# Patient Record
Sex: Male | Born: 1965 | Race: Black or African American | Hispanic: No | Marital: Married | Smoking: Former smoker
Health system: Southern US, Community
[De-identification: ages and names within clinical notes are randomized; demographics above are authoritative.]

## PROBLEM LIST (undated history)

## (undated) ENCOUNTER — Emergency Department (HOSPITAL_COMMUNITY): Payer: BLUE CROSS/BLUE SHIELD | Source: Home / Self Care

## (undated) DIAGNOSIS — R5383 Other fatigue: Secondary | ICD-10-CM

## (undated) DIAGNOSIS — Z720 Tobacco use: Secondary | ICD-10-CM

## (undated) DIAGNOSIS — N049 Nephrotic syndrome with unspecified morphologic changes: Secondary | ICD-10-CM

## (undated) DIAGNOSIS — I420 Dilated cardiomyopathy: Secondary | ICD-10-CM

## (undated) DIAGNOSIS — E1140 Type 2 diabetes mellitus with diabetic neuropathy, unspecified: Secondary | ICD-10-CM

## (undated) DIAGNOSIS — E11319 Type 2 diabetes mellitus with unspecified diabetic retinopathy without macular edema: Secondary | ICD-10-CM

## (undated) DIAGNOSIS — I10 Essential (primary) hypertension: Secondary | ICD-10-CM

## (undated) DIAGNOSIS — I2510 Atherosclerotic heart disease of native coronary artery without angina pectoris: Secondary | ICD-10-CM

## (undated) DIAGNOSIS — E785 Hyperlipidemia, unspecified: Secondary | ICD-10-CM

## (undated) DIAGNOSIS — D649 Anemia, unspecified: Secondary | ICD-10-CM

## (undated) DIAGNOSIS — I5022 Chronic systolic (congestive) heart failure: Secondary | ICD-10-CM

## (undated) DIAGNOSIS — G629 Polyneuropathy, unspecified: Secondary | ICD-10-CM

## (undated) DIAGNOSIS — F1411 Cocaine abuse, in remission: Secondary | ICD-10-CM

## (undated) DIAGNOSIS — N189 Chronic kidney disease, unspecified: Secondary | ICD-10-CM

## (undated) DIAGNOSIS — M1990 Unspecified osteoarthritis, unspecified site: Secondary | ICD-10-CM

## (undated) DIAGNOSIS — T4145XA Adverse effect of unspecified anesthetic, initial encounter: Secondary | ICD-10-CM

## (undated) DIAGNOSIS — G4733 Obstructive sleep apnea (adult) (pediatric): Secondary | ICD-10-CM

## (undated) DIAGNOSIS — F32A Depression, unspecified: Secondary | ICD-10-CM

## (undated) DIAGNOSIS — F329 Major depressive disorder, single episode, unspecified: Secondary | ICD-10-CM

## (undated) DIAGNOSIS — E669 Obesity, unspecified: Secondary | ICD-10-CM

## (undated) DIAGNOSIS — I119 Hypertensive heart disease without heart failure: Secondary | ICD-10-CM

## (undated) DIAGNOSIS — E039 Hypothyroidism, unspecified: Secondary | ICD-10-CM

## (undated) DIAGNOSIS — N058 Unspecified nephritic syndrome with other morphologic changes: Secondary | ICD-10-CM

## (undated) DIAGNOSIS — H3581 Retinal edema: Secondary | ICD-10-CM

## (undated) DIAGNOSIS — T8859XA Other complications of anesthesia, initial encounter: Secondary | ICD-10-CM

## (undated) DIAGNOSIS — K219 Gastro-esophageal reflux disease without esophagitis: Secondary | ICD-10-CM

## (undated) DIAGNOSIS — F1011 Alcohol abuse, in remission: Secondary | ICD-10-CM

## (undated) DIAGNOSIS — I82509 Chronic embolism and thrombosis of unspecified deep veins of unspecified lower extremity: Secondary | ICD-10-CM

## (undated) HISTORY — DX: Atherosclerotic heart disease of native coronary artery without angina pectoris: I25.10

## (undated) HISTORY — DX: Dilated cardiomyopathy: I42.0

## (undated) HISTORY — DX: Tobacco use: Z72.0

## (undated) HISTORY — DX: Hypothyroidism, unspecified: E03.9

## (undated) HISTORY — DX: Morbid (severe) obesity due to excess calories: E66.01

## (undated) HISTORY — DX: Type 2 diabetes mellitus with unspecified diabetic retinopathy without macular edema: E11.319

## (undated) HISTORY — DX: Other fatigue: R53.83

## (undated) HISTORY — DX: Alcohol abuse, in remission: F10.11

## (undated) HISTORY — DX: Hypertensive heart disease without heart failure: I11.9

## (undated) HISTORY — DX: Unspecified nephritic syndrome with other morphologic changes: N05.8

## (undated) HISTORY — PX: WISDOM TOOTH EXTRACTION: SHX21

## (undated) HISTORY — DX: Cocaine abuse, in remission: F14.11

## (undated) HISTORY — DX: Chronic systolic (congestive) heart failure: I50.22

## (undated) HISTORY — DX: Obesity, unspecified: E66.9

## (undated) HISTORY — DX: Obstructive sleep apnea (adult) (pediatric): G47.33

## (undated) HISTORY — DX: Chronic kidney disease, unspecified: N18.9

## (undated) HISTORY — DX: Type 2 diabetes mellitus with diabetic neuropathy, unspecified: E11.40

## (undated) HISTORY — DX: Gastro-esophageal reflux disease without esophagitis: K21.9

## (undated) HISTORY — DX: Nephrotic syndrome with unspecified morphologic changes: N04.9

## (undated) HISTORY — DX: Hyperlipidemia, unspecified: E78.5

## (undated) HISTORY — DX: Chronic embolism and thrombosis of unspecified deep veins of unspecified lower extremity: I82.509

---

## 2004-04-17 ENCOUNTER — Emergency Department (HOSPITAL_COMMUNITY): Admission: EM | Admit: 2004-04-17 | Discharge: 2004-04-17 | Payer: Self-pay | Admitting: Emergency Medicine

## 2007-07-29 ENCOUNTER — Emergency Department (HOSPITAL_COMMUNITY): Admission: EM | Admit: 2007-07-29 | Discharge: 2007-07-29 | Payer: Self-pay | Admitting: Emergency Medicine

## 2008-04-18 ENCOUNTER — Emergency Department (HOSPITAL_COMMUNITY): Admission: EM | Admit: 2008-04-18 | Discharge: 2008-04-19 | Payer: Self-pay | Admitting: Emergency Medicine

## 2010-08-08 ENCOUNTER — Emergency Department (HOSPITAL_COMMUNITY)
Admission: EM | Admit: 2010-08-08 | Discharge: 2010-08-08 | Disposition: A | Discharge status: Home / Self Care | Payer: Self-pay | Attending: Emergency Medicine | Admitting: Emergency Medicine

## 2010-08-08 DIAGNOSIS — L02818 Cutaneous abscess of other sites: Secondary | ICD-10-CM | POA: Insufficient documentation

## 2010-08-08 DIAGNOSIS — E119 Type 2 diabetes mellitus without complications: Secondary | ICD-10-CM | POA: Insufficient documentation

## 2011-02-01 ENCOUNTER — Other Ambulatory Visit: Payer: Self-pay

## 2011-02-01 DIAGNOSIS — N186 End stage renal disease: Secondary | ICD-10-CM

## 2011-02-01 DIAGNOSIS — T82898A Other specified complication of vascular prosthetic devices, implants and grafts, initial encounter: Secondary | ICD-10-CM

## 2011-02-01 DIAGNOSIS — I10 Essential (primary) hypertension: Secondary | ICD-10-CM

## 2011-02-11 LAB — COMPREHENSIVE METABOLIC PANEL
ALT: 29
AST: 23
Albumin: 3.3 — ABNORMAL LOW
Alkaline Phosphatase: 98
BUN: 12
CO2: 29
Calcium: 9.3
Chloride: 102
Creatinine, Ser: 1.14
GFR calc Af Amer: >60
GFR calc non Af Amer: >60
Glucose, Bld: 242 — ABNORMAL HIGH
Potassium: 4.1
Sodium: 137
Total Bilirubin: 1.1
Total Protein: 7.1

## 2011-02-11 LAB — URINALYSIS, ROUTINE W REFLEX MICROSCOPIC
Bilirubin Urine: NEGATIVE
Glucose, UA: 500 — AB
Hgb urine dipstick: NEGATIVE
Ketones, ur: NEGATIVE
Nitrite: NEGATIVE
Protein, ur: NEGATIVE
Specific Gravity, Urine: 1.036 — ABNORMAL HIGH
Urobilinogen, UA: 1
pH: 6

## 2011-02-20 LAB — GLUCOSE, CAPILLARY: Glucose-Capillary: 132 — ABNORMAL HIGH

## 2012-02-12 ENCOUNTER — Emergency Department (HOSPITAL_COMMUNITY)
Admission: EM | Admit: 2012-02-12 | Discharge: 2012-02-12 | Disposition: A | Discharge status: Home / Self Care | Payer: Self-pay | Attending: Emergency Medicine | Admitting: Emergency Medicine

## 2012-02-12 ENCOUNTER — Encounter (HOSPITAL_COMMUNITY): Payer: Self-pay | Admitting: *Deleted

## 2012-02-12 DIAGNOSIS — E119 Type 2 diabetes mellitus without complications: Secondary | ICD-10-CM | POA: Insufficient documentation

## 2012-02-12 DIAGNOSIS — Z760 Encounter for issue of repeat prescription: Secondary | ICD-10-CM

## 2012-02-12 DIAGNOSIS — L02219 Cutaneous abscess of trunk, unspecified: Secondary | ICD-10-CM | POA: Insufficient documentation

## 2012-02-12 DIAGNOSIS — Z79899 Other long term (current) drug therapy: Secondary | ICD-10-CM | POA: Insufficient documentation

## 2012-02-12 DIAGNOSIS — E7800 Pure hypercholesterolemia, unspecified: Secondary | ICD-10-CM | POA: Insufficient documentation

## 2012-02-12 DIAGNOSIS — L0291 Cutaneous abscess, unspecified: Secondary | ICD-10-CM

## 2012-02-12 DIAGNOSIS — Z794 Long term (current) use of insulin: Secondary | ICD-10-CM | POA: Insufficient documentation

## 2012-02-12 DIAGNOSIS — F17200 Nicotine dependence, unspecified, uncomplicated: Secondary | ICD-10-CM | POA: Insufficient documentation

## 2012-02-12 DIAGNOSIS — I10 Essential (primary) hypertension: Secondary | ICD-10-CM | POA: Insufficient documentation

## 2012-02-12 DIAGNOSIS — L089 Local infection of the skin and subcutaneous tissue, unspecified: Secondary | ICD-10-CM | POA: Insufficient documentation

## 2012-02-12 HISTORY — DX: Essential (primary) hypertension: I10

## 2012-02-12 LAB — GLUCOSE, CAPILLARY: Glucose-Capillary: 285 mg/dL — ABNORMAL HIGH (ref 70–99)

## 2012-02-12 MED ORDER — METFORMIN HCL 1000 MG PO TABS: 1000.0000 mg | ORAL_TABLET | Freq: Two times a day (BID) | ORAL | Status: DC

## 2012-02-12 MED ORDER — SULFAMETHOXAZOLE-TRIMETHOPRIM 800-160 MG PO TABS: 1.0000 | ORAL_TABLET | Freq: Two times a day (BID) | ORAL | Status: DC

## 2012-02-12 MED ORDER — HYDROCODONE-ACETAMINOPHEN 5-325 MG PO TABS: ORAL_TABLET | ORAL | Status: DC

## 2012-02-12 NOTE — ED Notes (Signed)
Pt has abscess to back that started 1 week ago.

## 2012-02-12 NOTE — ED Provider Notes (Signed)
History     CSN: [id number]  Arrival date & time 02/12/12  1447   First MD Initiated Contact with Patient 02/12/12 1531      Chief Complaint  Patient presents with  . Abscess    (Consider location/radiation/quality/duration/timing/severity/associated sxs/prior treatment) HPI Comments: Patient presents with multiple abscesses located on his back and the back of his head. Abscesses are recurrent for the past 4 years. The largest one is located on his mid back. It has been present for approximately one week and has been disrupting his sleep due to pain. It has been draining. Patient denies fever, chills, nausea, vomiting, diarrhea. Nothing has made the pain better or worse. Onset gradual. Course has been constant. No treatments prior to arrival.   Patient is a diabetic. He states that he has been out of his metformin for several days and is requesting a medication refill. No history of kidney trouble. Patient does not currently have PCP but is in the process of finding one.  Patient is a 46 y.o. male presenting with abscess.  Abscess  Pertinent negatives include no fever and no vomiting.    Past Medical History  Diagnosis Date  . Diabetes mellitus   . Hypertension   . High cholesterol     History reviewed. No pertinent past surgical history.  History reviewed. No pertinent family history.  History  Substance Use Topics  . Smoking status: Current Every Day Smoker    Types: Cigarettes  . Smokeless tobacco: Not on file  . Alcohol Use: Yes     rarely      Review of Systems  Constitutional: Negative for fever.  Gastrointestinal: Negative for nausea and vomiting.  Skin: Negative for color change.       Positive for abscess  Hematological: Negative for adenopathy.    Allergies  Review of patient's allergies indicates no known allergies.  Home Medications   Current Outpatient Rx  Name Route Sig Dispense Refill  . INSULIN ASPART 100 UNIT/ML [place] SOLN Subcutaneous Inject  26 Units into the skin 2 (two) times daily.    Marland Kitchen HYDROCODONE-ACETAMINOPHEN 5-325 MG PO TABS  Take 1-2 tablets every 6 hours as needed for severe pain 10 tablet 0  . METFORMIN HCL 1000 MG PO TABS Oral Take 1 tablet (1,000 mg total) by mouth 2 (two) times daily. 60 tablet 0  . SULFAMETHOXAZOLE-TRIMETHOPRIM 800-160 MG PO TABS Oral Take 1 tablet by mouth every 12 (twelve) hours. 14 tablet 0    BP 149/105  Pulse 99  Temp 99.5 F (37.5 C) (Oral)  Resp 22  Ht 5\' 10"  (1.778 m)  Wt 287 lb (130.182 kg)  BMI 41.18 kg/m2  SpO2 99%  Physical Exam  Nursing note and vitals reviewed. Constitutional: He appears well-developed and well-nourished.  HENT:  Head: Normocephalic and atraumatic.  Eyes: Conjunctivae normal are normal.  Neck: Normal range of motion. Neck supple.  Pulmonary/Chest: No respiratory distress.  Neurological: He is alert.  Skin: Skin is warm and dry.       5cm x 5cm area of induration consistent with abscess on right mid-back, no surrounding cellulitis, no drainage noted. Several scattered crusted pustules posterior scalp without evidence of cellulitis. No drainage.   Psychiatric: He has a normal mood and affect.    ED Course  Procedures (including critical care time)  Labs Reviewed  GLUCOSE, CAPILLARY - Abnormal; Notable for the following:    Glucose-Capillary 285 (*)     All other components within normal limits  No results found.   1. Abscess   2. Medication refill    4:58 PM Patient seen and examined.   Vital signs reviewed and are as follows: Filed Vitals:   02/12/12 1457  BP: 149/105  Pulse: 99  Temp: 99.5 F (37.5 C)  Resp: 22   INCISION AND DRAINAGE Performed by: Carolee Rota Consent: Verbal consent obtained. Risks and benefits: risks, benefits and alternatives were discussed Type: abscess  Body area: right middle back  Anesthesia: local infiltration  Local anesthetic: lidocaine 2% with epinephrine  Anesthetic total: 3 ml  Complexity:  complex Blunt dissection to break up loculations  Drainage: purulent  Drainage amount: moderate  Packing material: 1/4 in iodoform gauze  Patient tolerance: Patient tolerated the procedure well with no immediate complications.  4:59 PM The patient was urged to return to the Emergency Department urgently with worsening pain, swelling, expanding erythema especially if it streaks away from the affected area, fever, or if they have any other concerns.   The patient was instructed to remove packing at home in 48 hours and return to the Emergency Department or go to their PCP at that time for a recheck if their symptoms are not entirely resolved.  The patient verbalized understanding and stated agreement with this plan.   4:59 PM Patient counseled on use of narcotic pain medications. Counseled not to combine these medications with others containing tylenol. Urged not to drink alcohol, drive, or perform any other activities that requires focus while taking these medications. The patient verbalizes understanding and agrees with the plan.  Urged PCP follow-up, referrals given.    MDM  Patient with skin abscess amenable to incision and drainage on back.  No areas to drain on scalp, doubt kerion. Abscess was large enough to warrant packing with removal and wound recheck in 2 days if not improving. Patient seems reliable. No signs of cellulitis is surrounding skin.  Will d/c to home.  Abx given DM and pustules on scalp.  Med refill: metformin refilled, no urine change, no h/o renal problems.        Renne Crigler, [place] 02/12/12 [phone]

## 2012-02-12 NOTE — ED Provider Notes (Signed)
Medical screening examination/treatment/procedure(s) were performed by non-physician practitioner and as supervising physician I was immediately available for consultation/collaboration.  Larry Alcock, MD 02/12/12 1725 

## 2012-03-24 ENCOUNTER — Emergency Department (HOSPITAL_COMMUNITY)
Admission: EM | Admit: 2012-03-24 | Discharge: 2012-03-24 | Disposition: A | Discharge status: Home / Self Care | Payer: Self-pay | Attending: Emergency Medicine | Admitting: Emergency Medicine

## 2012-03-24 ENCOUNTER — Encounter (HOSPITAL_COMMUNITY): Payer: Self-pay

## 2012-03-24 ENCOUNTER — Emergency Department (HOSPITAL_COMMUNITY): Payer: Self-pay

## 2012-03-24 DIAGNOSIS — I10 Essential (primary) hypertension: Secondary | ICD-10-CM | POA: Insufficient documentation

## 2012-03-24 DIAGNOSIS — E7800 Pure hypercholesterolemia, unspecified: Secondary | ICD-10-CM | POA: Insufficient documentation

## 2012-03-24 DIAGNOSIS — Z794 Long term (current) use of insulin: Secondary | ICD-10-CM | POA: Insufficient documentation

## 2012-03-24 DIAGNOSIS — J069 Acute upper respiratory infection, unspecified: Secondary | ICD-10-CM | POA: Insufficient documentation

## 2012-03-24 DIAGNOSIS — F17200 Nicotine dependence, unspecified, uncomplicated: Secondary | ICD-10-CM | POA: Insufficient documentation

## 2012-03-24 DIAGNOSIS — E119 Type 2 diabetes mellitus without complications: Secondary | ICD-10-CM | POA: Insufficient documentation

## 2012-03-24 LAB — GLUCOSE, CAPILLARY: Glucose-Capillary: 226 mg/dL — ABNORMAL HIGH (ref 70–99)

## 2012-03-24 MED ORDER — GUAIFENESIN-CODEINE 100-10 MG/5ML PO SOLN
5.0000 mL | Freq: Once | ORAL | Status: AC
  Administered 2012-03-24: 5 mL via ORAL
  Filled 2012-03-24: qty 5

## 2012-03-24 MED ORDER — GUAIFENESIN-CODEINE 100-10 MG/5ML PO SYRP: 5.0000 mL | ORAL_SOLUTION | Freq: Three times a day (TID) | ORAL | Status: DC | PRN

## 2012-03-24 MED ORDER — DEXAMETHASONE 6 MG PO TABS
10.0000 mg | ORAL_TABLET | Freq: Once | ORAL | Status: AC
  Administered 2012-03-24: 10 mg via ORAL
  Filled 2012-03-24: qty 1

## 2012-03-24 NOTE — ED Notes (Signed)
cbg 226 

## 2012-03-24 NOTE — ED Notes (Signed)
Pt c/o of chest pain greater with coughing.  Pt also c/o of shortness of breath x 1 week greater with walking

## 2012-03-24 NOTE — ED Notes (Signed)
Pt presents with no acute distress.  C/o of cold like symptoms x 1 week.  denies  Cardiac chest pain

## 2012-03-27 ENCOUNTER — Emergency Department (HOSPITAL_COMMUNITY)
Admission: EM | Admit: 2012-03-27 | Discharge: 2012-03-27 | Disposition: A | Discharge status: Home / Self Care | Payer: Self-pay | Attending: Emergency Medicine | Admitting: Emergency Medicine

## 2012-03-27 ENCOUNTER — Encounter (HOSPITAL_COMMUNITY): Payer: Self-pay | Admitting: *Deleted

## 2012-03-27 DIAGNOSIS — E7800 Pure hypercholesterolemia, unspecified: Secondary | ICD-10-CM | POA: Insufficient documentation

## 2012-03-27 DIAGNOSIS — F17200 Nicotine dependence, unspecified, uncomplicated: Secondary | ICD-10-CM | POA: Insufficient documentation

## 2012-03-27 DIAGNOSIS — J029 Acute pharyngitis, unspecified: Secondary | ICD-10-CM | POA: Insufficient documentation

## 2012-03-27 DIAGNOSIS — I10 Essential (primary) hypertension: Secondary | ICD-10-CM | POA: Insufficient documentation

## 2012-03-27 DIAGNOSIS — R05 Cough: Secondary | ICD-10-CM | POA: Insufficient documentation

## 2012-03-27 DIAGNOSIS — Z79899 Other long term (current) drug therapy: Secondary | ICD-10-CM | POA: Insufficient documentation

## 2012-03-27 DIAGNOSIS — R059 Cough, unspecified: Secondary | ICD-10-CM | POA: Insufficient documentation

## 2012-03-27 DIAGNOSIS — Z794 Long term (current) use of insulin: Secondary | ICD-10-CM | POA: Insufficient documentation

## 2012-03-27 DIAGNOSIS — E119 Type 2 diabetes mellitus without complications: Secondary | ICD-10-CM | POA: Insufficient documentation

## 2012-03-27 LAB — RAPID STREP SCREEN (MED CTR MEBANE ONLY): Streptococcus, Group A Screen (Direct): NEGATIVE

## 2012-03-27 LAB — GLUCOSE, CAPILLARY: Glucose-Capillary: 251 mg/dL — ABNORMAL HIGH (ref 70–99)

## 2012-03-27 MED ORDER — DEXAMETHASONE SODIUM PHOSPHATE 10 MG/ML IJ SOLN
10.0000 mg | Freq: Once | INTRAMUSCULAR | Status: AC
  Administered 2012-03-27: 10 mg via INTRAMUSCULAR
  Filled 2012-03-27: qty 1

## 2012-03-27 MED ORDER — DIPHENHYDRAMINE HCL 25 MG PO TABS: 25.0000 mg | ORAL_TABLET | Freq: Four times a day (QID) | ORAL | Status: DC

## 2012-03-27 MED ORDER — HYDROCHLOROTHIAZIDE 25 MG PO TABS: 25.0000 mg | ORAL_TABLET | Freq: Every day | ORAL | Status: DC

## 2012-03-27 MED ORDER — DIPHENHYDRAMINE HCL 25 MG PO CAPS
25.0000 mg | ORAL_CAPSULE | Freq: Once | ORAL | Status: AC
  Administered 2012-03-27: 25 mg via ORAL
  Filled 2012-03-27: qty 1

## 2012-03-27 MED ORDER — METFORMIN HCL 1000 MG PO TABS: 1000.0000 mg | ORAL_TABLET | Freq: Two times a day (BID) | ORAL | Status: DC

## 2012-03-27 MED ORDER — GI COCKTAIL ~~LOC~~
30.0000 mL | Freq: Once | ORAL | Status: AC
  Administered 2012-03-27: 30 mL via ORAL
  Filled 2012-03-27: qty 30

## 2012-03-27 MED ORDER — INSULIN ASPART 100 UNIT/ML ~~LOC~~ SOLN: 23.0000 [IU] | Freq: Two times a day (BID) | SUBCUTANEOUS | Status: DC

## 2012-03-27 NOTE — ED Provider Notes (Signed)
History     CSN: [id number]  Arrival date & time 03/27/12  0106   First MD Initiated Contact with Patient 03/27/12 0224      Chief Complaint  Patient presents with  . Oral Swelling    throat   HPI  History provided by the patient. Patient is a 46 year old male with history of hypertension, hyperlipidemia, diabetes who presents with complaints of sore throat and swelling. Patient reports he has had symptoms of upper respiratory infection with cough, congestion and rhinorrhea for the past several days. Symptoms have been waxing and waning and earlier yesterday he developed gradually increasing sharp sore throat pain worse with coughing and swallowing. Patient also feels sensation of throat swelling. He denies any difficulty breathing or swallowing. Patient is handling salivary secretions.    Past Medical History  Diagnosis Date  . Diabetes mellitus   . Hypertension   . High cholesterol     History reviewed. No pertinent past surgical history.  History reviewed. No pertinent family history.  History  Substance Use Topics  . Smoking status: Current Every Day Smoker    Types: Cigarettes  . Smokeless tobacco: Not on file  . Alcohol Use: Yes     Comment: rarely      Review of Systems  Constitutional: Negative for fever and chills.  HENT: Positive for congestion and sore throat. Negative for drooling and mouth sores.   Respiratory: Positive for cough. Negative for wheezing and stridor.   Gastrointestinal: Negative for nausea, vomiting and diarrhea.  Skin: Negative for rash.    Allergies  Review of patient's allergies indicates no known allergies.  Home Medications   Current Outpatient Rx  Name  Route  Sig  Dispense  Refill  . GUAIFENESIN-CODEINE 100-10 MG/5ML PO SYRP   Oral   Take 5 mLs by mouth 3 (three) times daily as needed for cough.   120 mL   0   . METFORMIN HCL 1000 MG PO TABS   Oral   Take 1 tablet (1,000 mg total) by mouth 2 (two) times daily.   60  tablet   0   . INSULIN ASPART 100 UNIT/ML [place] SOLN   Subcutaneous   Inject 23 Units into the skin 2 (two) times daily.          Marland Kitchen PSEUDOEPHEDRINE-GUAIFENESIN ER 60-600 MG PO TB12   Oral   Take 1-2 tablets by mouth every 12 (twelve) hours as needed. For sinus and chest congestion           BP 159/88  Pulse 96  Temp 99.6 F (37.6 C) (Oral)  Resp 17  SpO2 98%  Physical Exam  Nursing note and vitals reviewed. Constitutional: He appears well-developed and well-nourished. No distress.  HENT:  Head: Normocephalic.  Right Ear: External ear normal.  Left Ear: External ear normal.  Mouth/Throat: Oropharynx is clear and moist.       Mild erythema of pharynx. Uvula midline. Normal tonsils without exudate. Tongue and lips normal without swelling.  Neck: Normal range of motion. Neck supple. No tracheal deviation present.  Cardiovascular: Normal rate and regular rhythm.   Pulmonary/Chest: Effort normal and breath sounds normal. No stridor. No respiratory distress. He has no rales.  Abdominal: Soft. There is no tenderness. There is no rebound and no guarding.  Lymphadenopathy:    He has no cervical adenopathy.  Neurological: He is alert.  Skin: Skin is warm. No rash noted.  Psychiatric: He has a normal mood and affect. His behavior is  normal.    ED Course  Procedures   Results for orders placed during the hospital encounter of 03/27/12  RAPID STREP SCREEN      Component Value Range   Streptococcus, Group A Screen (Direct) NEGATIVE  NEGATIVE  GLUCOSE, CAPILLARY      Component Value Range   Glucose-Capillary 251 (*) 70 - 99 mg/dL   Comment 1 Notify RN     Comment 2 Documented in Chart         1. Pharyngitis       MDM  Patient seen and evaluated. Patient in no acute distress with normal respirations and O2 sat. No stridor. No significant findings of swelling or edema.      Angus Seller, [place] 03/28/12 [phone]

## 2012-03-27 NOTE — ED Notes (Signed)
Pt states that he was on Tuesday for chest congestion and some throat swelling. He was given steroids, which helped. Now the swelling has progressed and he is felling like his throat is more constricted. Pt is talking normally and joking with his wife. VS WNL.

## 2012-03-27 NOTE — ED Notes (Signed)
Patient is alert and oriented x3.  He was seen here on Tuesday for upper respiratory  Infection and sent home without antibiotics.  He has returned with throat swelling and Difficulty swallowing.  He rates his current pain level at 10 of 10.

## 2012-03-28 NOTE — ED Provider Notes (Signed)
Medical screening examination/treatment/procedure(s) were performed by non-physician practitioner and as supervising physician I was immediately available for consultation/collaboration.  Sunnie Nielsen, MD 03/28/12 [phone]

## 2012-04-02 NOTE — ED Provider Notes (Signed)
History    46 year old male with URI type symptoms. Patient reports feeling congested and having a cough. Gradual onset of symptoms about 3 days ago. Cough is nonproductive. No fevers or chills. Does complain of a sore throat. No difficulty breathing or swallowing. No rash. No leg pain or swelling. Denies history of blood clot. Denies chest pain.  CSN: [id number]  Arrival date & time 03/24/12  1149   First MD Initiated Contact with Patient 03/24/12 1228      Chief Complaint  Patient presents with  . URI    x1week  . Sore Throat    x1week  . Fever    (Consider location/radiation/quality/duration/timing/severity/associated sxs/prior treatment) HPI  Past Medical History  Diagnosis Date  . Diabetes mellitus   . Hypertension   . High cholesterol     History reviewed. No pertinent past surgical history.  No family history on file.  History  Substance Use Topics  . Smoking status: Current Every Day Smoker    Types: Cigarettes  . Smokeless tobacco: Not on file  . Alcohol Use: Yes     Comment: rarely      Review of Systems  Review of symptoms negative unless otherwise noted in HPI.  Allergies  Review of patient's allergies indicates no known allergies.  Home Medications   Current Outpatient Rx  Name  Route  Sig  Dispense  Refill  . METFORMIN HCL 1000 MG PO TABS   Oral   Take 1 tablet (1,000 mg total) by mouth 2 (two) times daily.   60 tablet   0   . PSEUDOEPHEDRINE-GUAIFENESIN ER 60-600 MG PO TB12   Oral   Take 1-2 tablets by mouth every 12 (twelve) hours as needed. For sinus and chest congestion         . DIPHENHYDRAMINE HCL 25 MG PO TABS   Oral   Take 1 tablet (25 mg total) by mouth every 6 (six) hours.   20 tablet   0   . GUAIFENESIN-CODEINE 100-10 MG/5ML PO SYRP   Oral   Take 5 mLs by mouth 3 (three) times daily as needed for cough.   120 mL   0   . HYDROCHLOROTHIAZIDE 25 MG PO TABS   Oral   Take 1 tablet (25 mg total) by mouth daily.   30  tablet   0   . INSULIN ASPART 100 UNIT/ML [place] SOLN   Subcutaneous   Inject 23 Units into the skin 2 (two) times daily.          . INSULIN ASPART 100 UNIT/ML [place] SOLN   Subcutaneous   Inject 23 Units into the skin 2 (two) times daily at 10 AM and 5 PM.   1 vial   12   . METFORMIN HCL 1000 MG PO TABS   Oral   Take 1 tablet (1,000 mg total) by mouth 2 (two) times daily.   30 tablet   0     BP 150/86  Pulse 102  Temp 98.9 F (37.2 C) (Oral)  Resp 20  Ht 5\' 11"  (1.803 m)  Wt 287 lb (130.182 kg)  BMI 40.03 kg/m2  SpO2 100%  Physical Exam  Nursing note and vitals reviewed. Constitutional: He appears well-developed and well-nourished. No distress.  HENT:  Head: Normocephalic and atraumatic.       Posterior pharynx is clear. Uvula is midline. Handling secretions. Normal phonation. Submental tissues are soft. Neck is supple. No stridor. No bruit. No adenopathy.  Eyes: Conjunctivae normal are  normal. Right eye exhibits no discharge. Left eye exhibits no discharge.  Neck: Neck supple.  Cardiovascular: Normal rate, regular rhythm and normal heart sounds.  Exam reveals no gallop and no friction rub.   No murmur heard. Pulmonary/Chest: Effort normal and breath sounds normal. No respiratory distress. He has no wheezes.  Abdominal: Soft. He exhibits no distension. There is no tenderness.  Musculoskeletal: He exhibits no edema and no tenderness.  Neurological: He is alert.  Skin: Skin is warm and dry.  Psychiatric: He has a normal mood and affect. His behavior is normal. Thought content normal.    ED Course  Procedures (including critical care time)  Labs Reviewed  GLUCOSE, CAPILLARY - Abnormal; Notable for the following:    Glucose-Capillary 226 (*)     All other components within normal limits  LAB REPORT - SCANNED   No results found.   1. Viral URI       MDM  Rolled male with URI type symptoms. Suspect viral illness. Patient is complaining of sore throat. Very low  suspicion for the space neck infection. Dose of steroids given for some symptomatic relief. Guaifenesin with codeine for his cough. Chest x-ray is clear. No respiratory distress on exam. He is afebrile and oxygen saturations are good. He for discharge at this time. Return precautions were discussed. Outpatient followup otherwise.      Raeford Razor, MD 04/02/12 1022

## 2013-08-30 ENCOUNTER — Other Ambulatory Visit: Payer: Self-pay | Admitting: Internal Medicine

## 2013-08-30 ENCOUNTER — Ambulatory Visit
Admission: RE | Admit: 2013-08-30 | Discharge: 2013-08-30 | Disposition: A | Discharge status: Home / Self Care | Payer: BC Managed Care – PPO | Source: Ambulatory Visit | Attending: Internal Medicine | Admitting: Internal Medicine

## 2013-08-30 DIAGNOSIS — M549 Dorsalgia, unspecified: Secondary | ICD-10-CM

## 2014-08-03 ENCOUNTER — Emergency Department (HOSPITAL_COMMUNITY): Payer: BLUE CROSS/BLUE SHIELD

## 2014-08-03 ENCOUNTER — Encounter (HOSPITAL_COMMUNITY): Payer: Self-pay | Admitting: Emergency Medicine

## 2014-08-03 ENCOUNTER — Emergency Department (HOSPITAL_COMMUNITY)
Admission: EM | Admit: 2014-08-03 | Discharge: 2014-08-04 | Disposition: A | Discharge status: Home / Self Care | Payer: BLUE CROSS/BLUE SHIELD | Attending: Emergency Medicine | Admitting: Emergency Medicine

## 2014-08-03 DIAGNOSIS — Z720 Tobacco use: Secondary | ICD-10-CM | POA: Insufficient documentation

## 2014-08-03 DIAGNOSIS — J039 Acute tonsillitis, unspecified: Secondary | ICD-10-CM | POA: Diagnosis not present

## 2014-08-03 DIAGNOSIS — Z794 Long term (current) use of insulin: Secondary | ICD-10-CM | POA: Insufficient documentation

## 2014-08-03 DIAGNOSIS — Z79899 Other long term (current) drug therapy: Secondary | ICD-10-CM | POA: Diagnosis not present

## 2014-08-03 DIAGNOSIS — J029 Acute pharyngitis, unspecified: Secondary | ICD-10-CM | POA: Insufficient documentation

## 2014-08-03 DIAGNOSIS — E1165 Type 2 diabetes mellitus with hyperglycemia: Secondary | ICD-10-CM

## 2014-08-03 DIAGNOSIS — I10 Essential (primary) hypertension: Secondary | ICD-10-CM | POA: Insufficient documentation

## 2014-08-03 LAB — I-STAT CG4 LACTIC ACID, ED: LACTIC ACID, VENOUS: 1.63 mmol/L (ref 0.5–2.0)

## 2014-08-03 LAB — CBC WITH DIFFERENTIAL/PLATELET
BASOS ABS: 0 10*3/uL (ref 0.0–0.1)
BASOS PCT: 0 % (ref 0–1)
EOS ABS: 0.1 10*3/uL (ref 0.0–0.7)
Eosinophils Relative: 1 % (ref 0–5)
HCT: 47.5 % (ref 39.0–52.0)
HEMOGLOBIN: 16.2 g/dL (ref 13.0–17.0)
Lymphocytes Relative: 26 % (ref 12–46)
Lymphs Abs: 2.3 10*3/uL (ref 0.7–4.0)
MCH: 26.6 pg (ref 26.0–34.0)
MCHC: 34.1 g/dL (ref 30.0–36.0)
MCV: 78.1 fL (ref 78.0–100.0)
MONO ABS: 0.9 10*3/uL (ref 0.1–1.0)
Monocytes Relative: 10 % (ref 3–12)
NEUTROS PCT: 63 % (ref 43–77)
Neutro Abs: 5.7 10*3/uL (ref 1.7–7.7)
Platelets: 210 10*3/uL (ref 150–400)
RBC: 6.08 MIL/uL — AB (ref 4.22–5.81)
RDW: 13.3 % (ref 11.5–15.5)
WBC: 9 10*3/uL (ref 4.0–10.5)

## 2014-08-03 LAB — RAPID STREP SCREEN (MED CTR MEBANE ONLY): STREPTOCOCCUS, GROUP A SCREEN (DIRECT): NEGATIVE

## 2014-08-03 LAB — I-STAT CHEM 8, ED
BUN: 12 mg/dL (ref 6–23)
CALCIUM ION: 1.1 mmol/L — AB (ref 1.12–1.23)
CHLORIDE: 99 mmol/L (ref 96–112)
Creatinine, Ser: 1.2 mg/dL (ref 0.50–1.35)
GLUCOSE: 579 mg/dL — AB (ref 70–99)
HEMATOCRIT: 54 % — AB (ref 39.0–52.0)
Hemoglobin: 18.4 g/dL — ABNORMAL HIGH (ref 13.0–17.0)
POTASSIUM: 4.4 mmol/L (ref 3.5–5.1)
SODIUM: 130 mmol/L — AB (ref 135–145)
TCO2: 19 mmol/L (ref 0–100)

## 2014-08-03 LAB — CBG MONITORING, ED
GLUCOSE-CAPILLARY: 453 mg/dL — AB (ref 70–99)
GLUCOSE-CAPILLARY: 346 mg/dL — AB (ref 70–99)
Glucose-Capillary: 370 mg/dL — ABNORMAL HIGH (ref 70–99)

## 2014-08-03 MED ORDER — SODIUM CHLORIDE 0.9 % IV BOLUS (SEPSIS)
1000.0000 mL | Freq: Once | INTRAVENOUS | Status: AC
  Administered 2014-08-03: 1000 mL via INTRAVENOUS

## 2014-08-03 MED ORDER — AMOXICILLIN 500 MG PO CAPS
1000.0000 mg | ORAL_CAPSULE | Freq: Once | ORAL | Status: AC
Start: 2014-08-03 — End: 2014-08-03
  Administered 2014-08-03: 1000 mg via ORAL
  Filled 2014-08-03: qty 2

## 2014-08-03 MED ORDER — DEXTROSE-NACL 5-0.45 % IV SOLN: INTRAVENOUS | Status: DC

## 2014-08-03 MED ORDER — SODIUM CHLORIDE 0.9 % IV SOLN
1000.0000 mL | INTRAVENOUS | Status: DC
  Administered 2014-08-03: 1000 mL via INTRAVENOUS

## 2014-08-03 MED ORDER — IOHEXOL 300 MG/ML  SOLN
80.0000 mL | Freq: Once | INTRAMUSCULAR | Status: AC | PRN
  Administered 2014-08-03: 100 mL via INTRAVENOUS

## 2014-08-03 MED ORDER — SODIUM CHLORIDE 0.9 % IV SOLN
INTRAVENOUS | Status: DC
  Administered 2014-08-03: 3.9 [IU]/h via INTRAVENOUS
  Filled 2014-08-03: qty 2.5

## 2014-08-03 MED ORDER — HYDROCODONE-ACETAMINOPHEN 5-325 MG PO TABS
2.0000 | ORAL_TABLET | Freq: Once | ORAL | Status: AC
  Administered 2014-08-03: 2 via ORAL
  Filled 2014-08-03: qty 2

## 2014-08-03 MED ORDER — MORPHINE SULFATE 4 MG/ML IJ SOLN
6.0000 mg | Freq: Once | INTRAMUSCULAR | Status: AC
  Administered 2014-08-03: 6 mg via INTRAVENOUS
  Filled 2014-08-03: qty 2

## 2014-08-03 MED ORDER — SODIUM CHLORIDE 0.9 % IV SOLN
1000.0000 mL | Freq: Once | INTRAVENOUS | Status: AC
  Administered 2014-08-03: 1000 mL via INTRAVENOUS

## 2014-08-03 NOTE — ED Notes (Signed)
Pt c/o sore throat and L side ear ache. Pt sts that the pain began on the R side of his throat and ear x 3 weeks ago and now has switched to the left. Pt sts he has a hx of "getting strep throat easily." Pt A&Ox4. Pt spitting into bag because it hurts to swallow.

## 2014-08-03 NOTE — Progress Notes (Signed)
EDCM spoke to patient at bedside.  Patient confirms his pcp is Dr. Eula Listen at [facility] [place].  System updated.

## 2014-08-03 NOTE — ED Provider Notes (Signed)
This patient's care was assumed from Fayrene Helper, PA-C at shift change. Please see his note for further.  This is a 49 y.o. male with a history of diabetes on metformin who presents to emergency complaining of a sore throat for 3 weeks. He reports having difficulty swallowing. The patient does have an elevated blood sugar of 579 with an anion gap of 12. Will start gluco stabilizer while waiting on CT scan.  At my evaluation the patient reports feeling better and wants something to eat and drink. I advised to hold off until CT scan is read. He does have some mild tonsillar hypertrophy on my exam. Uvula is midline without edema. No exudates. Rapid strep is negative.  CT indicates advanced tonsillitis with a 13 mm fluid collection in the deep left tonsil that could reflect abscess or pus accumulation within a crypt. Also indicated reactive cervical adenopathy without suppurative change. Will consult ENT.  Spoke with Dr. Lazarus Salines from ENT who would like the patient placed on Amoxicillin 1000 mg 3 times a day for 3 days then 500 mg 3 times a day for another 7 days. He reports that the patient has not improved by the weekend to return to the emergency department or follow-up with him in his office right away. I advised the patient of this plan. The patient tolerated to Vicodin tablets and his first dose of amoxicillin in the emergency department without difficulty. Glucose still high. Will wait for his blood sugar to fall to return to a normal level before discharge.  Glucose feel to 233. His heart rate is 98 during my reevaluation. He has no elevated white count on CBC.  We'll discharge with amoxicillin, Norco and Naprosyn for pain control. I advised him of the strict return precautions again and he agrees. Advised about watching blood sugars and eating healthy for his diabetes. He reports feeling better and ready for discharge. He is tolerated his oral medications as well as diet soda and water in the emergency  department prior to discharge. I advised the patient to follow-up with their primary care provider this week. I advised the patient to return to the emergency department with new or worsening symptoms or new concerns. The patient verbalized understanding and agreement with plan.   This patient was discussed with and evaluated by Dr. Ethelda Chick who agrees with assessment and plan.   Results for orders placed or performed during the hospital encounter of 08/03/14  Rapid strep screen   (If patient has fever and/or without cough or runny nose)  Result Value Ref Range   Streptococcus, Group A Screen (Direct) NEGATIVE NEGATIVE  CBC with Differential/Platelet  Result Value Ref Range   WBC 9.0 4.0 - 10.5 K/uL   RBC 6.08 (H) 4.22 - 5.81 MIL/uL   Hemoglobin 16.2 13.0 - 17.0 g/dL   HCT 69.6 29.5 - 28.4 %   MCV 78.1 78.0 - 100.0 fL   MCH 26.6 26.0 - 34.0 pg   MCHC 34.1 30.0 - 36.0 g/dL   RDW 13.2 44.0 - 10.2 %   Platelets 210 150 - 400 K/uL   Neutrophils Relative % 63 43 - 77 %   Lymphocytes Relative 26 12 - 46 %   Monocytes Relative 10 3 - 12 %   Eosinophils Relative 1 0 - 5 %   Basophils Relative 0 0 - 1 %   Neutro Abs 5.7 1.7 - 7.7 K/uL   Lymphs Abs 2.3 0.7 - 4.0 K/uL   Monocytes Absolute 0.9 0.1 -  1.0 K/uL   Eosinophils Absolute 0.1 0.0 - 0.7 K/uL   Basophils Absolute 0.0 0.0 - 0.1 K/uL   Smear Review MORPHOLOGY UNREMARKABLE   I-stat chem 8, ed  Result Value Ref Range   Sodium 130 (L) 135 - 145 mmol/L   Potassium 4.4 3.5 - 5.1 mmol/L   Chloride 99 96 - 112 mmol/L   BUN 12 6 - 23 mg/dL   Creatinine, Ser 2.68 0.50 - 1.35 mg/dL   Glucose, Bld 341 (HH) 70 - 99 mg/dL   Calcium, Ion 9.62 (L) 1.12 - 1.23 mmol/L   TCO2 19 0 - 100 mmol/L   Hemoglobin 18.4 (H) 13.0 - 17.0 g/dL   HCT 22.9 (H) 79.8 - 92.1 %   Comment NOTIFIED PHYSICIAN   I-Stat CG4 Lactic Acid, ED  Result Value Ref Range   Lactic Acid, Venous 1.63 0.5 - 2.0 mmol/L  CBG monitoring, ED  Result Value Ref Range    Glucose-Capillary 453 (H) 70 - 99 mg/dL  CBG monitoring, ED  Result Value Ref Range   Glucose-Capillary 370 (H) 70 - 99 mg/dL  CBG monitoring, ED  Result Value Ref Range   Glucose-Capillary 346 (H) 70 - 99 mg/dL  CBG monitoring, ED  Result Value Ref Range   Glucose-Capillary 233 (H) 70 - 99 mg/dL   Ct Soft Tissue Neck W Contrast  08/03/2014   CLINICAL DATA:  Sore throat with swallowing for 3 weeks.  EXAM: CT NECK WITH CONTRAST  TECHNIQUE: Multidetector CT imaging of the neck was performed using the standard protocol following the bolus administration of intravenous contrast.  CONTRAST:  OMNIPAQUE IOHEXOL 300 MG/ML  SOLN  COMPARISON:  None.  FINDINGS: Pharynx and larynx: Palatine tonsil enlargement, asymmetric to the left where there is also fat haziness around the visceral compartment. This appearance, with bilateral cervical lymphadenopathy, is consistent with tonsillitis. Within the deep portion of the left tonsil is a 13 mm oval fluid collection which does not appear completely encapsulated. There is mild submucosal edema extending along the left oral pharyngeal wall towards the supraglottic larynx. Mild medialization of the left vocal fold, likely from described mucosal thickening. No retropharyngeal edema.  Salivary glands: Negative  Thyroid: Negative  Lymph nodes: Rounded enlargement of bilateral cervical chain lymph nodes. No suppurative change.  Vascular: Major cervical vessels are patent.  Limited intracranial: Negative  Visualized orbits: Remote blowout fracture of the medial wall left orbit with medialization of the medial rectus.  Mastoids and visualized paranasal sinuses: Clear  Skeleton: Negative  Upper chest: Clear  IMPRESSION: 1. Advanced tonsillitis. A 13 mm fluid collection in the deep left tonsil could reflect abscess or pus accumulation within a crypt. 2. Reactive cervical adenopathy without suppurative change. 3. If no clinical resolution after antibiotics, recommend repeat  imaging.   Electronically Signed   By: Marnee Spring M.D.   On: 08/03/2014 20:37       Everlene Farrier, PA-C 08/04/14 1941  [name], MD 08/14/14 [phone]

## 2014-08-03 NOTE — ED Provider Notes (Signed)
CSN: [id number]     Arrival date & time 08/03/14  1638 History  This chart was scribe for Charles Sou, MD by Charles Montgomery, ED Scribe. The patient was seen in room WTR8/WTR8 and the patient's care was started at 7:04 PM.    Chief Complaint  Patient presents with  . Sore Throat   The history is provided by the patient. No language interpreter was used.   HPI Comments: Charles Montgomery is a 49 y.o. male with a hx of DM, HLD, and HTN who presents to the Emergency Department complaining of sore throat onset 3 weeks ago. He explains that the sore throat started on his right side and has now moved to his left side.He reports associated trouble swallowing as he has to spit into a bag, tactile fever, chills, and ear pain. He denies abdominal pain, rhinorrhea, cough, CP, and SOB. He states that he took Tylenol Sore Throat and gargled Peroxide with no relief.  He reports a similar symptoms 12 years ago where he was hospitalized with IV antibiotic but did not have his tonsils removed.   Past Medical History  Diagnosis Date  . Diabetes mellitus   . Hypertension   . High cholesterol    History reviewed. No pertinent past surgical history. No family history on file. History  Substance Use Topics  . Smoking status: Current Every Day Smoker    Types: Cigarettes  . Smokeless tobacco: Not on file  . Alcohol Use: Yes     Comment: rarely    Review of Systems  Constitutional: Positive for fever.  HENT: Positive for ear pain, sore throat and trouble swallowing.   Respiratory: Negative for cough and shortness of breath.   Cardiovascular: Negative for chest pain.  Gastrointestinal: Negative for abdominal pain.  All other systems reviewed and are negative.     Allergies  Review of patient's allergies indicates no known allergies.  Home Medications   Prior to Admission medications   Medication Sig Start Date End Date Taking? Authorizing Provider  diphenhydrAMINE (BENADRYL) 25 MG tablet  Take 1 tablet (25 mg total) by mouth every 6 (six) hours. 03/27/12   Peter Dammen, PA-C  guaiFENesin-codeine (ROBITUSSIN AC) 100-10 MG/5ML syrup Take 5 mLs by mouth 3 (three) times daily as needed for cough. 03/24/12   Raeford Razor, MD  hydrochlorothiazide (HYDRODIURIL) 25 MG tablet Take 1 tablet (25 mg total) by mouth daily. 03/27/12   Peter Dammen, PA-C  insulin aspart (NOVOLOG) 100 UNIT/ML injection Inject 23 Units into the skin 2 (two) times daily.     Historical Provider, MD  insulin aspart (NOVOLOG) 100 UNIT/ML injection Inject 23 Units into the skin 2 (two) times daily at 10 AM and 5 PM. 03/27/12   Ivonne Andrew, PA-C  metFORMIN (GLUCOPHAGE) 1000 MG tablet Take 1 tablet (1,000 mg total) by mouth 2 (two) times daily. 02/12/12   Renne Crigler, PA-C  metFORMIN (GLUCOPHAGE) 1000 MG tablet Take 1 tablet (1,000 mg total) by mouth 2 (two) times daily. 03/27/12   Ivonne Andrew, PA-C  pseudoephedrine-guaifenesin (MUCINEX D) 60-600 MG per tablet Take 1-2 tablets by mouth every 12 (twelve) hours as needed. For sinus and chest congestion    Historical Provider, MD   BP 158/122 mmHg  Pulse 123  Temp(Src) 99.4 F (37.4 C) (Oral)  Resp 22  SpO2 96% Physical Exam  Constitutional: He is oriented to person, place, and time. He appears well-developed and well-nourished. No distress.  HENT:  Head: Normocephalic and atraumatic.  Mouth/Throat: No oropharyngeal  exudate.  Uvula midline and oropharynx erythema No significant tonsillar swelling  Eyes: Conjunctivae and EOM are normal.  Neck: Neck supple. No tracheal deviation present.  Throat: Uvula midline, L tonsillar pillar mildlly erythematous, no obvious peritonsillar abscess.  Mild trismus, unable to tolerates own secretion.   Cardiovascular: Normal rate.   Pulmonary/Chest: Effort normal. No respiratory distress.  Abdominal: Soft. There is no tenderness.  Musculoskeletal: Normal range of motion.  Lymphadenopathy:    He has cervical adenopathy.   Neurological: He is alert and oriented to person, place, and time.  Skin: Skin is warm and dry.  Psychiatric: He has a normal mood and affect. His behavior is normal.  Nursing note and vitals reviewed.   ED Course  Procedures (including critical care time) DIAGNOSTIC STUDIES: Oxygen Saturation is 96% on RA, adequate by my interpretation.    COORDINATION OF CARE: 7:14 PM- Pt advised of plan for treatment and pt agrees.   Pt with L side neck pain and sore throat, unable to tolerates own secretion and is tachycardic and low grade temperature.  Pain on swallowing.  Plan to provide IVF, check basic labs, obtain neck CT soft tissue and monitor.  Care discussed with Dr. Ethelda Chick.  I also discuss care with Will Dansie PA-C who will continue with further management.    7:59 PM Patient is a diabetic, his glucose is 579. Normal anion gap. Glucose stabilizer initiated. Rapid strep test is negative.   Labs Review Labs Reviewed  I-STAT CHEM 8, ED - Abnormal; Notable for the following:    Sodium 130 (*)    Glucose, Bld 579 (*)    Calcium, Ion 1.10 (*)    Hemoglobin 18.4 (*)    HCT 54.0 (*)    All other components within normal limits  RAPID STREP SCREEN  CBC WITH DIFFERENTIAL/PLATELET  I-STAT CG4 LACTIC ACID, ED    Imaging Review No results found.   EKG Interpretation None      MDM   Final diagnoses:  Sore throat    BP 143/96 mmHg  Pulse 118  Temp(Src) 99.1 F (37.3 C) (Oral)  Resp 20  SpO2 96%   I personally performed the services described in this documentation, which was scribed in my presence. The recorded information has been reviewed and is accurate.     Charles Helper, PA-C 08/04/14 1207  Charles Sou, MD 08/14/14 [phone]

## 2014-08-03 NOTE — ED Provider Notes (Signed)
Plains of left sided sore throat onset several weeks ago. Pain worse with swallowing admits to subjective fever. No other associated symptoms. He states that he is been able to drink however only slight amounts he is presently spitting into a cup Singulair pain with swallowing on exam alert nontoxic HEENT exam oropharynx is red. Uvula midline. Bilateral tympanic membranes normal. Neck supple. Tender anterior cervical lymph nodes. No pain on manipulation of laryngeal cartilage  [name], MD 08/03/14 [phone]

## 2014-08-04 LAB — CBG MONITORING, ED: GLUCOSE-CAPILLARY: 233 mg/dL — AB (ref 70–99)

## 2014-08-04 MED ORDER — NAPROXEN 250 MG PO TABS: 250.0000 mg | ORAL_TABLET | Freq: Two times a day (BID) | ORAL | Status: DC

## 2014-08-04 MED ORDER — AMOXICILLIN 500 MG PO CAPS: ORAL_CAPSULE | ORAL | Status: DC

## 2014-08-04 MED ORDER — HYDROCODONE-ACETAMINOPHEN 5-325 MG PO TABS: 1.0000 | ORAL_TABLET | ORAL | Status: DC | PRN

## 2014-08-04 NOTE — Discharge Instructions (Signed)
Tonsillitis Tonsillitis is an infection of the throat that causes the tonsils to become red, tender, and swollen. Tonsils are collections of lymphoid tissue at the back of the throat. Each tonsil has crevices (crypts). Tonsils help fight nose and throat infections and keep infection from spreading to other parts of the body for the first 18 months of life.  CAUSES Sudden (acute) tonsillitis is usually caused by infection with streptococcal bacteria. Long-lasting (chronic) tonsillitis occurs when the crypts of the tonsils become filled with pieces of food and bacteria, which makes it easy for the tonsils to become repeatedly infected. SYMPTOMS  Symptoms of tonsillitis include:  A sore throat, with possible difficulty swallowing.  White patches on the tonsils.  Fever.  Tiredness.  New episodes of snoring during sleep, when you did not snore before.  Small, foul-smelling, yellowish-white pieces of material (tonsilloliths) that you occasionally cough up or spit out. The tonsilloliths can also cause you to have bad breath. DIAGNOSIS Tonsillitis can be diagnosed through a physical exam. Diagnosis can be confirmed with the results of lab tests, including a throat culture. TREATMENT  The goals of tonsillitis treatment include the reduction of the severity and duration of symptoms and prevention of associated conditions. Symptoms of tonsillitis can be improved with the use of steroids to reduce the swelling. Tonsillitis caused by bacteria can be treated with antibiotic medicines. Usually, treatment with antibiotic medicines is started before the cause of the tonsillitis is known. However, if it is determined that the cause is not bacterial, antibiotic medicines will not treat the tonsillitis. If attacks of tonsillitis are severe and frequent, your health care provider may recommend surgery to remove the tonsils (tonsillectomy). HOME CARE INSTRUCTIONS   Rest as much as possible and get plenty of  sleep.  Drink plenty of fluids. While the throat is very sore, eat soft foods or liquids, such as sherbet, soups, or instant breakfast drinks.  Eat frozen ice pops.  Gargle with a warm or cold liquid to help soothe the throat. Mix 1/4 teaspoon of salt and 1/4 teaspoon of baking soda in 8 oz of water. SEEK MEDICAL CARE IF:   Large, tender lumps develop in your neck.  A rash develops.  A green, yellow-brown, or bloody substance is coughed up.  You are unable to swallow liquids or food for 24 hours.  You notice that only one of the tonsils is swollen. SEEK IMMEDIATE MEDICAL CARE IF:   You develop any new symptoms such as vomiting, severe headache, stiff neck, chest pain, or trouble breathing or swallowing.  You have severe throat pain along with drooling or voice changes.  You have severe pain, unrelieved with recommended medications.  You are unable to fully open the mouth.  You develop redness, swelling, or severe pain anywhere in the neck.  You have a fever. MAKE SURE YOU:   Understand these instructions.  Will watch your condition.  Will get help right away if you are not doing well or get worse. Document Released: 02/13/2005 Document Revised: 09/20/2013 Document Reviewed: 10/23/2012 [facility] Patient Information 2015 [place], [place]. This information is not intended to replace advice given to you by your health care provider. Make sure you discuss any questions you have with your health care provider.  Diabetes Mellitus and Food It is important for you to manage your blood sugar (glucose) level. Your blood glucose level can be greatly affected by what you eat. Eating healthier foods in the appropriate amounts throughout the day at about the same  time each day will help you control your blood glucose level. It can also help slow or prevent worsening of your diabetes mellitus. Healthy eating may even help you improve the level of your blood pressure and reach or maintain a  healthy weight.  HOW CAN FOOD AFFECT ME? Carbohydrates Carbohydrates affect your blood glucose level more than any other type of food. Your dietitian will help you determine how many carbohydrates to eat at each meal and teach you how to count carbohydrates. Counting carbohydrates is important to keep your blood glucose at a healthy level, especially if you are using insulin or taking certain medicines for diabetes mellitus. Alcohol Alcohol can cause sudden decreases in blood glucose (hypoglycemia), especially if you use insulin or take certain medicines for diabetes mellitus. Hypoglycemia can be a life-threatening condition. Symptoms of hypoglycemia (sleepiness, dizziness, and disorientation) are similar to symptoms of having too much alcohol.  If your health care provider has given you approval to drink alcohol, do so in moderation and use the following guidelines:  Women should not have more than one drink per day, and men should not have more than two drinks per day. One drink is equal to:  12 oz of beer.  5 oz of wine.  1 oz of hard liquor.  Do not drink on an empty stomach.  Keep yourself hydrated. Have water, diet soda, or unsweetened iced tea.  Regular soda, juice, and other mixers might contain a lot of carbohydrates and should be counted. WHAT FOODS ARE NOT RECOMMENDED? As you make food choices, it is important to remember that all foods are not the same. Some foods have fewer nutrients per serving than other foods, even though they might have the same number of calories or carbohydrates. It is difficult to get your body what it needs when you eat foods with fewer nutrients. Examples of foods that you should avoid that are high in calories and carbohydrates but low in nutrients include:  Trans fats (most processed foods list trans fats on the Nutrition Facts label).  Regular soda.  Juice.  Candy.  Sweets, such as cake, pie, doughnuts, and cookies.  Fried foods. WHAT  FOODS CAN I EAT? Have nutrient-rich foods, which will nourish your body and keep you healthy. The food you should eat also will depend on several factors, including:  The calories you need.  The medicines you take.  Your weight.  Your blood glucose level.  Your blood pressure level.  Your cholesterol level. You also should eat a variety of foods, including:  Protein, such as meat, poultry, fish, tofu, nuts, and seeds (lean animal proteins are best).  Fruits.  Vegetables.  Dairy products, such as milk, cheese, and yogurt (low fat is best).  Breads, grains, pasta, cereal, rice, and beans.  Fats such as olive oil, trans fat-free margarine, canola oil, avocado, and olives. DOES EVERYONE WITH DIABETES MELLITUS HAVE THE SAME MEAL PLAN? Because every person with diabetes mellitus is different, there is not one meal plan that works for everyone. It is very important that you meet with a dietitian who will help you create a meal plan that is just right for you. Document Released: 01/31/2005 Document Revised: 05/11/2013 Document Reviewed: 04/02/2013 [facility] Patient Information 2015 [place], [place]. This information is not intended to replace advice given to you by your health care provider. Make sure you discuss any questions you have with your health care provider. Hyperglycemia Hyperglycemia occurs when the glucose (sugar) in your blood is too high.  Hyperglycemia can happen for many reasons, but it most often happens to people who do not know they have diabetes or are not managing their diabetes properly.  CAUSES  Whether you have diabetes or not, there are other causes of hyperglycemia. Hyperglycemia can occur when you have diabetes, but it can also occur in other situations that you might not be as aware of, such as: Diabetes  If you have diabetes and are having problems controlling your blood glucose, hyperglycemia could occur because of some of the following reasons:  Not  following your meal plan.  Not taking your diabetes medications or not taking it properly.  Exercising less or doing less activity than you normally do.  Being sick. Pre-diabetes  This cannot be ignored. Before people develop Type 2 diabetes, they almost always have "pre-diabetes." This is when your blood glucose levels are higher than normal, but not yet high enough to be diagnosed as diabetes. Research has shown that some long-term damage to the body, especially the heart and circulatory system, may already be occurring during pre-diabetes. If you take action to manage your blood glucose when you have pre-diabetes, you may delay or prevent Type 2 diabetes from developing. Stress  If you have diabetes, you may be "diet" controlled or on oral medications or insulin to control your diabetes. However, you may find that your blood glucose is higher than usual in the hospital whether you have diabetes or not. This is often referred to as "stress hyperglycemia." Stress can elevate your blood glucose. This happens because of hormones put out by the body during times of stress. If stress has been the cause of your high blood glucose, it can be followed regularly by your caregiver. That way he/she can make sure your hyperglycemia does not continue to get worse or progress to diabetes. Steroids  Steroids are medications that act on the infection fighting system (immune system) to block inflammation or infection. One side effect can be a rise in blood glucose. Most people can produce enough extra insulin to allow for this rise, but for those who cannot, steroids make blood glucose levels go even higher. It is not unusual for steroid treatments to "uncover" diabetes that is developing. It is not always possible to determine if the hyperglycemia will go away after the steroids are stopped. A special blood test called an A1c is sometimes done to determine if your blood glucose was elevated before the steroids were  started. SYMPTOMS  Thirsty.  Frequent urination.  Dry mouth.  Blurred vision.  Tired or fatigue.  Weakness.  Sleepy.  Tingling in feet or leg. DIAGNOSIS  Diagnosis is made by monitoring blood glucose in one or all of the following ways:  A1c test. This is a chemical found in your blood.  Fingerstick blood glucose monitoring.  Laboratory results. TREATMENT  First, knowing the cause of the hyperglycemia is important before the hyperglycemia can be treated. Treatment may include, but is not be limited to:  Education.  Change or adjustment in medications.  Change or adjustment in meal plan.  Treatment for an illness, infection, etc.  More frequent blood glucose monitoring.  Change in exercise plan.  Decreasing or stopping steroids.  Lifestyle changes. HOME CARE INSTRUCTIONS   Test your blood glucose as directed.  Exercise regularly. Your caregiver will give you instructions about exercise. Pre-diabetes or diabetes which comes on with stress is helped by exercising.  Eat wholesome, balanced meals. Eat often and at regular, fixed times. Your caregiver or  nutritionist will give you a meal plan to guide your sugar intake.  Being at an ideal weight is important. If needed, losing as little as 10 to 15 pounds may help improve blood glucose levels. SEEK MEDICAL CARE IF:   You have questions about medicine, activity, or diet.  You continue to have symptoms (problems such as increased thirst, urination, or weight gain). SEEK IMMEDIATE MEDICAL CARE IF:   You are vomiting or have diarrhea.  Your breath smells fruity.  You are breathing faster or slower.  You are very sleepy or incoherent.  You have numbness, tingling, or pain in your feet or hands.  You have chest pain.  Your symptoms get worse even though you have been following your caregiver's orders.  If you have any other questions or concerns. Document Released: 10/30/2000 Document Revised: 07/29/2011  Document Reviewed: 09/02/2011 [facility] Patient Information 2015 [place], [place]. This information is not intended to replace advice given to you by your health care provider. Make sure you discuss any questions you have with your health care provider.

## 2014-08-06 LAB — CULTURE, GROUP A STREP

## 2014-12-17 ENCOUNTER — Encounter (HOSPITAL_COMMUNITY): Payer: Self-pay | Admitting: *Deleted

## 2014-12-17 ENCOUNTER — Emergency Department (HOSPITAL_COMMUNITY)
Admission: EM | Admit: 2014-12-17 | Discharge: 2014-12-17 | Disposition: A | Discharge status: Home / Self Care | Payer: BLUE CROSS/BLUE SHIELD | Attending: Emergency Medicine | Admitting: Emergency Medicine

## 2014-12-17 DIAGNOSIS — Z7982 Long term (current) use of aspirin: Secondary | ICD-10-CM | POA: Insufficient documentation

## 2014-12-17 DIAGNOSIS — R509 Fever, unspecified: Secondary | ICD-10-CM | POA: Diagnosis not present

## 2014-12-17 DIAGNOSIS — L0291 Cutaneous abscess, unspecified: Secondary | ICD-10-CM | POA: Insufficient documentation

## 2014-12-17 DIAGNOSIS — R000 Tachycardia, unspecified: Secondary | ICD-10-CM | POA: Insufficient documentation

## 2014-12-17 DIAGNOSIS — Z79899 Other long term (current) drug therapy: Secondary | ICD-10-CM | POA: Insufficient documentation

## 2014-12-17 DIAGNOSIS — I10 Essential (primary) hypertension: Secondary | ICD-10-CM | POA: Insufficient documentation

## 2014-12-17 DIAGNOSIS — E1165 Type 2 diabetes mellitus with hyperglycemia: Secondary | ICD-10-CM | POA: Diagnosis not present

## 2014-12-17 DIAGNOSIS — L0390 Cellulitis, unspecified: Secondary | ICD-10-CM | POA: Diagnosis not present

## 2014-12-17 DIAGNOSIS — Z794 Long term (current) use of insulin: Secondary | ICD-10-CM | POA: Insufficient documentation

## 2014-12-17 DIAGNOSIS — G629 Polyneuropathy, unspecified: Secondary | ICD-10-CM | POA: Insufficient documentation

## 2014-12-17 DIAGNOSIS — R51 Headache: Secondary | ICD-10-CM | POA: Diagnosis present

## 2014-12-17 DIAGNOSIS — Z720 Tobacco use: Secondary | ICD-10-CM | POA: Insufficient documentation

## 2014-12-17 LAB — BASIC METABOLIC PANEL
Anion gap: 8 (ref 5–15)
BUN: 11 mg/dL (ref 6–20)
CHLORIDE: 102 mmol/L (ref 101–111)
CO2: 23 mmol/L (ref 22–32)
Calcium: 8.4 mg/dL — ABNORMAL LOW (ref 8.9–10.3)
Creatinine, Ser: 1.58 mg/dL — ABNORMAL HIGH (ref 0.61–1.24)
GFR calc Af Amer: 59 mL/min — ABNORMAL LOW (ref >60)
GFR calc non Af Amer: 51 mL/min — ABNORMAL LOW (ref >60)
GLUCOSE: 355 mg/dL — AB (ref 65–99)
POTASSIUM: 4.4 mmol/L (ref 3.5–5.1)
Sodium: 133 mmol/L — ABNORMAL LOW (ref 135–145)

## 2014-12-17 LAB — CBC
HCT: 42.5 % (ref 39.0–52.0)
Hemoglobin: 14.5 g/dL (ref 13.0–17.0)
MCH: 26.5 pg (ref 26.0–34.0)
MCHC: 34.1 g/dL (ref 30.0–36.0)
MCV: 77.7 fL — AB (ref 78.0–100.0)
PLATELETS: 223 10*3/uL (ref 150–400)
RBC: 5.47 MIL/uL (ref 4.22–5.81)
RDW: 14.3 % (ref 11.5–15.5)
WBC: 7 10*3/uL (ref 4.0–10.5)

## 2014-12-17 LAB — URINALYSIS, ROUTINE W REFLEX MICROSCOPIC
BILIRUBIN URINE: NEGATIVE
Glucose, UA: >1000 mg/dL — AB
Ketones, ur: NEGATIVE mg/dL
Leukocytes, UA: NEGATIVE
Nitrite: NEGATIVE
SPECIFIC GRAVITY, URINE: 1.033 — AB (ref 1.005–1.030)
UROBILINOGEN UA: 1 mg/dL (ref 0.0–1.0)
pH: 5.5 (ref 5.0–8.0)

## 2014-12-17 LAB — CBG MONITORING, ED
GLUCOSE-CAPILLARY: 358 mg/dL — AB (ref 65–99)
Glucose-Capillary: 257 mg/dL — ABNORMAL HIGH (ref 65–99)

## 2014-12-17 LAB — URINE MICROSCOPIC-ADD ON

## 2014-12-17 MED ORDER — KETOROLAC TROMETHAMINE 30 MG/ML IJ SOLN
30.0000 mg | Freq: Once | INTRAMUSCULAR | Status: AC
  Administered 2014-12-17: 30 mg via INTRAVENOUS
  Filled 2014-12-17: qty 1

## 2014-12-17 MED ORDER — DOXYCYCLINE HYCLATE 50 MG PO CAPS: 100.0000 mg | ORAL_CAPSULE | Freq: Two times a day (BID) | ORAL | Status: DC

## 2014-12-17 MED ORDER — SODIUM CHLORIDE 0.9 % IV BOLUS (SEPSIS)
1000.0000 mL | Freq: Once | INTRAVENOUS | Status: AC
  Administered 2014-12-17: 1000 mL via INTRAVENOUS

## 2014-12-17 NOTE — ED Provider Notes (Signed)
CSN: [id number]     Arrival date & time 12/17/14  1305 History   First MD Initiated Contact with Patient 12/17/14 1355     Chief Complaint  Patient presents with  . Fever  . Headache     (Consider location/radiation/quality/duration/timing/severity/associated sxs/prior Treatment) HPI Charles Montgomery is a 49 y.o. male with past medical history of DM type II, hypertension, hyperlipidemia presenting to the emergency department with complaint of 2 weeks of fever, generalized body aches.  Pt is afebrile today 99.6 and reported fever is subjective as patient has not checked at home but reports feeling feverish with chills and sweats.  Also reports occipital headache described as pressure-like and constant.  Patient complains of neuropathic type pain originating in his feet and shooting up his legs.  Patient does not attribute his symptoms to anything in particular, stating that they are intermittent.  He reports using ibuprofen at home and that it does help with fever but not pain.  Patient reports glucose readings at home have been elevated of late despite being on metformin, Levemir, and Novolog to treat.  Today his CBG was 358.  Patient also concerned about possible abcesses under his left armpit and on his back.  Past Medical History  Diagnosis Date  . Diabetes mellitus   . Hypertension   . High cholesterol    History reviewed. No pertinent past surgical history. No family history on file. History  Substance Use Topics  . Smoking status: Current Every Day Smoker    Types: Cigarettes  . Smokeless tobacco: Not on file  . Alcohol Use: Yes     Comment: rarely    Review of Systems  Constitutional: Positive for fever and chills.  Respiratory: Negative for cough and shortness of breath.   Cardiovascular: Negative for chest pain and leg swelling.  Gastrointestinal: Negative for abdominal pain, diarrhea and constipation.  Genitourinary: Negative for dysuria, frequency and flank pain.   Musculoskeletal: Positive for myalgias.       Neuropathic leg pain originating in feet.  Neurological: Positive for headaches. Negative for dizziness and light-headedness.  All other systems reviewed and are negative.     Allergies  Review of patient's allergies indicates no known allergies.  Home Medications   Prior to Admission medications   Medication Sig Start Date End Date Taking? Authorizing Provider  aspirin EC 81 MG tablet Take 81 mg by mouth daily.   Yes Historical Provider, MD  ibuprofen (ADVIL,MOTRIN) 200 MG tablet Take 400 mg by mouth every 6 (six) hours as needed for fever.   Yes Historical Provider, MD  insulin detemir (LEVEMIR) 100 UNIT/ML injection Inject 70 Units into the skin at bedtime.   Yes Historical Provider, MD  metFORMIN (GLUCOPHAGE) 1000 MG tablet Take 1 tablet (1,000 mg total) by mouth 2 (two) times daily. 02/12/12  Yes Renne Crigler, PA-C  esomeprazole (NEXIUM) 20 MG capsule Take 20 mg by mouth daily at 12 noon.    Historical Provider, MD  hydrochlorothiazide (HYDRODIURIL) 25 MG tablet Take 1 tablet (25 mg total) by mouth daily. Patient not taking: Reported on 08/03/2014 03/27/12   Ivonne Evone Arseneau, PA-C  insulin aspart (NOVOLOG) 100 UNIT/ML injection Inject 23 Units into the skin 2 (two) times daily at 10 AM and 5 PM. Patient not taking: Reported on 08/03/2014 03/27/12   Ivonne Alastair Hennes, PA-C   BP 139/94 mmHg  Pulse 117  Temp(Src) 99.6 F (37.6 C) (Oral)  Resp 18  SpO2 97% Physical Exam  Constitutional: He is oriented to  person, place, and time. He appears well-developed and well-nourished. No distress.  HENT:  Head: Normocephalic and atraumatic.  Eyes: EOM are normal.  Neck: Normal range of motion. Neck supple.  Cardiovascular: Normal rate and intact distal pulses.   Rate is tachycardic  Pulmonary/Chest: Effort normal and breath sounds normal.  Abdominal: Soft. There is no tenderness.  Neurological: He is alert and oriented to person, place, and time. He  has normal strength. No cranial nerve deficit.    ED Course  Procedures (including critical care time) Labs Review Labs Reviewed  CBC - Abnormal; Notable for the following:    MCV 77.7 (*)    All other components within normal limits  BASIC METABOLIC PANEL - Abnormal; Notable for the following:    Sodium 133 (*)    Glucose, Bld 355 (*)    Creatinine, Ser 1.58 (*)    Calcium 8.4 (*)    GFR calc non Af Amer 51 (*)    GFR calc Af Amer 59 (*)    All other components within normal limits  URINALYSIS, ROUTINE W REFLEX MICROSCOPIC (NOT AT [facility]) - Abnormal; Notable for the following:    APPearance CLOUDY (*)    Specific Gravity, Urine 1.033 (*)    Glucose, UA >1000 (*)    Hgb urine dipstick LARGE (*)    Protein, ur >300 (*)    All other components within normal limits  URINE MICROSCOPIC-ADD ON - Abnormal; Notable for the following:    Bacteria, UA FEW (*)    Casts GRANULAR CAST (*)    All other components within normal limits  CBG MONITORING, ED - Abnormal; Notable for the following:    Glucose-Capillary 358 (*)    All other components within normal limits  URINE CULTURE    Imaging Review No results found.   EKG Interpretation None      MDM   Final diagnoses:  None   49 yo male with past medical history of DM type II, hypertension, hyperlipidemia presenting with 2 weeks of fever, body aches, ? Abcesses.  CBG of 385 on admission, given 1L NS fluids and will recheck after.  Pt denies sx of hyperglycemia.  CBC unremarkable, no anion gap.  Subjective fever without meningeal signs, urinalysis and CBC without evidence of infection.  Neuro exam normal, strength 5/5 bilaterally in upper and lower extremities.  He is afebrile today, normotensive.  Will plan to discharge to home on oral antibiotics to cover MRSA with questionable abcesses given pts uncontrolled diabetes.  Will recommend close follow up with PCP to gain better glucose control for his diabetes.    Gwynn Burly,  DO 12/17/14 1707  Jerelyn Scott, MD 12/19/14 [phone]

## 2014-12-17 NOTE — ED Notes (Addendum)
Pt reports fever and generalized body aches with headache that started 2 weeks ago. Pt reports drainage from left eye as well. Pt noted to have abcess to left underarm. Pt reports CBG readings of "high" at home.

## 2014-12-17 NOTE — Discharge Instructions (Signed)
Type 2 Diabetes Mellitus  Type 2 diabetes mellitus is a long-term (chronic) disease. In type 2 diabetes:  · The pancreas does not make enough of a hormone called insulin.  · The cells in the body do not respond as well to the insulin that is made.  · Both of the above can happen.  Normally, insulin moves sugars from food into tissue cells. This gives you energy. If you have type 2 diabetes, sugars cannot be moved into tissue cells. This causes high blood sugar (hyperglycemia).   HOME CARE  · Have your hemoglobin A1c level checked twice a year. The level shows if your diabetes is under control or out of control.  · Test your blood sugar level every day as told by your doctor.  · Check your ketone levels by testing your pee (urine) when you are sick and as told.  · Take your diabetes or insulin medicine as told by your doctor.  ¨ Never run out of insulin.  ¨ Adjust how much insulin you give yourself based on how many carbs (carbohydrates) you eat. Carbs are in many foods, such as fruits, vegetables, whole grains, and dairy products.  · Have a healthy snack between every healthy meal. Have 3 meals and 3 snacks a day.  · Lose weight if you are overweight.  · Carry a medical alert card or wear your medical alert jewelry.  · Carry a 15-gram carb snack with you at all times. Examples include:  ¨ Glucose pills, 3 or 4.  ¨ Glucose gel, 15-gram tube.  ¨ Raisins, 2 tablespoons (24 grams).  ¨ Jelly beans, 6.  ¨ Animal crackers, 8.  ¨ Regular (not diet) pop, 4 ounces (120 milliliters).  ¨ Gummy treats, 9.  · Notice low blood sugar (hypoglycemia) symptoms, such as:  ¨ Shaking (tremors).  ¨ Trouble thinking clearly.  ¨ Sweating.  ¨ Faster heart rate.  ¨ Headache.  ¨ Dry mouth.  ¨ Hunger.  ¨ Crabbiness (irritability).  ¨ Being worried or tense (anxious).  ¨ Restless sleep.  ¨ A change in speech or coordination.  ¨ Confusion.  · Treat low blood sugar right away. If you are alert and can swallow, follow the 15:15 rule:  ¨ Take 15-20  grams of a rapid-acting glucose or carb. This includes glucose gel, glucose pills, or 4 ounces (120 milliliters) of fruit juice, regular pop, or low-fat milk.  ¨ Check your blood sugar level 15 minutes after taking the glucose.  ¨ Take 15-20 grams more of glucose if the repeat blood sugar level is still 70 mg/dL (milligrams/deciliter) or below.  ¨ Eat a meal or snack within 1 hour of the blood sugar levels going back to normal.  · Notice early symptoms of high blood sugar, such as:  ¨ Being really thirsty or drinking a lot (polydipsia).  ¨ Peeing a lot (polyuria).  · Do at least 150 minutes of physical activity a week or as told.  ¨ Split the 150 minutes of activity up during the week. Do not do 150 minutes of activity in one day.  ¨ Perform exercises, such as weight lifting, at least 2 times a week or as told.  ¨ Spend no more than 90 minutes at one time inactive.  · Adjust your insulin or food intake as needed if you start a new exercise or sport.  · Follow your sick-day plan when you are not able to eat or drink as usual.  · Do not smoke, chew tobacco,   or use electronic cigarettes.  · Women who are not pregnant should drink no more than 1 drink a day. Men should drink no more than 2 drinks a day.  ¨ Only drink alcohol with food.  ¨ Ask your doctor if alcohol is safe for you.  ¨ Tell your doctor if you drink alcohol several times during the week.  · See your doctor regularly.  · Schedule an eye exam soon after you are told you have diabetes. Schedule exams once every year.  · Check your skin and feet every day. Check for cuts, bruises, redness, nail problems, bleeding, blisters, or sores. A doctor should do a foot exam once a year.  · Brush your teeth and gums twice a day. Floss once a day. Visit your dentist regularly.  · Share your diabetes plan with your workplace or school.  · Stay up-to-date with shots that fight against diseases (immunizations).  · Learn how to deal with stress.  · Get diabetes education and  support as needed.  · Ask your doctor for special help if:  ¨ You need help to maintain or improve how you do things on your own.  ¨ You need help to maintain or improve the quality of your life.  ¨ You have foot or hand problems.  ¨ You have trouble cleaning yourself, dressing, eating, or doing physical activity.  GET HELP IF:  · You are unable to eat or drink for more than 6 hours.  · You feel sick to your stomach (nauseous) or throw up (vomit) for more than 6 hours.  · Your blood sugar level is over 240 mg/dL.  · There is a change in mental status.  · You get another serious illness.  · You have watery poop (diarrhea) for more than 6 hours.  · You have been sick or have had a fever for 2 or more days and are not getting better.  · You have pain when you are active.  GET HELP RIGHT AWAY IF:  · You have trouble breathing.  · Your ketone levels are higher than your doctor says they should be.  MAKE SURE YOU:  · Understand these instructions.  · Will watch your condition.  · Will get help right away if you are not doing well or get worse.  Document Released: 02/13/2008 Document Revised: 09/20/2013 Document Reviewed: 12/06/2011  Exit[employer]® Patient Information ©2015 [employer]. This information is not intended to replace advice given to you by your health care provider. Make sure you discuss any questions you have with your health care provider.

## 2014-12-17 NOTE — ED Notes (Addendum)
Patient to ED with C/O fever, malaise, generalized pain and a bloody stool 3 days ago.  Last BM was yesterday and it was normal.  Denies Dysuria & foul smelling urine.  Denies abdominal pain.  Denies dyspnea.  Patient also C/O having multiple abscesses.

## 2014-12-18 LAB — URINE CULTURE: Culture: NO GROWTH

## 2014-12-30 ENCOUNTER — Encounter (HOSPITAL_COMMUNITY): Payer: Self-pay | Admitting: Emergency Medicine

## 2014-12-30 ENCOUNTER — Emergency Department (HOSPITAL_COMMUNITY): Payer: BLUE CROSS/BLUE SHIELD

## 2014-12-30 DIAGNOSIS — Z841 Family history of disorders of kidney and ureter: Secondary | ICD-10-CM

## 2014-12-30 DIAGNOSIS — I82492 Acute embolism and thrombosis of other specified deep vein of left lower extremity: Secondary | ICD-10-CM | POA: Diagnosis present

## 2014-12-30 DIAGNOSIS — E1122 Type 2 diabetes mellitus with diabetic chronic kidney disease: Secondary | ICD-10-CM | POA: Diagnosis present

## 2014-12-30 DIAGNOSIS — N179 Acute kidney failure, unspecified: Principal | ICD-10-CM | POA: Diagnosis present

## 2014-12-30 DIAGNOSIS — E8809 Other disorders of plasma-protein metabolism, not elsewhere classified: Secondary | ICD-10-CM | POA: Diagnosis present

## 2014-12-30 DIAGNOSIS — N183 Chronic kidney disease, stage 3 (moderate): Secondary | ICD-10-CM | POA: Diagnosis present

## 2014-12-30 DIAGNOSIS — E1142 Type 2 diabetes mellitus with diabetic polyneuropathy: Secondary | ICD-10-CM | POA: Diagnosis present

## 2014-12-30 DIAGNOSIS — R002 Palpitations: Secondary | ICD-10-CM | POA: Diagnosis not present

## 2014-12-30 DIAGNOSIS — Z79899 Other long term (current) drug therapy: Secondary | ICD-10-CM

## 2014-12-30 DIAGNOSIS — E780 Pure hypercholesterolemia: Secondary | ICD-10-CM | POA: Diagnosis present

## 2014-12-30 DIAGNOSIS — I428 Other cardiomyopathies: Secondary | ICD-10-CM | POA: Diagnosis present

## 2014-12-30 DIAGNOSIS — N049 Nephrotic syndrome with unspecified morphologic changes: Secondary | ICD-10-CM | POA: Diagnosis present

## 2014-12-30 DIAGNOSIS — Z7982 Long term (current) use of aspirin: Secondary | ICD-10-CM

## 2014-12-30 DIAGNOSIS — Z794 Long term (current) use of insulin: Secondary | ICD-10-CM

## 2014-12-30 DIAGNOSIS — F17210 Nicotine dependence, cigarettes, uncomplicated: Secondary | ICD-10-CM | POA: Diagnosis present

## 2014-12-30 DIAGNOSIS — R0602 Shortness of breath: Secondary | ICD-10-CM | POA: Diagnosis not present

## 2014-12-30 DIAGNOSIS — I5021 Acute systolic (congestive) heart failure: Secondary | ICD-10-CM | POA: Diagnosis present

## 2014-12-30 DIAGNOSIS — I132 Hypertensive heart and chronic kidney disease with heart failure and with stage 5 chronic kidney disease, or end stage renal disease: Secondary | ICD-10-CM | POA: Diagnosis present

## 2014-12-30 DIAGNOSIS — Z6841 Body Mass Index (BMI) 40.0 and over, adult: Secondary | ICD-10-CM

## 2014-12-30 DIAGNOSIS — E785 Hyperlipidemia, unspecified: Secondary | ICD-10-CM | POA: Diagnosis present

## 2014-12-30 LAB — I-STAT TROPONIN, ED: Troponin i, poc: 0.05 ng/mL (ref 0.00–0.08)

## 2014-12-30 LAB — COMPREHENSIVE METABOLIC PANEL
ALK PHOS: 111 U/L (ref 38–126)
ALT: 30 U/L (ref 17–63)
AST: 40 U/L (ref 15–41)
Albumin: 1.9 g/dL — ABNORMAL LOW (ref 3.5–5.0)
Anion gap: 8 (ref 5–15)
BILIRUBIN TOTAL: 0.8 mg/dL (ref 0.3–1.2)
BUN: 38 mg/dL — AB (ref 6–20)
CO2: 17 mmol/L — ABNORMAL LOW (ref 22–32)
Calcium: 8.3 mg/dL — ABNORMAL LOW (ref 8.9–10.3)
Chloride: 109 mmol/L (ref 101–111)
Creatinine, Ser: 4.37 mg/dL — ABNORMAL HIGH (ref 0.61–1.24)
GFR calc Af Amer: 17 mL/min — ABNORMAL LOW (ref >60)
GFR calc non Af Amer: 15 mL/min — ABNORMAL LOW (ref >60)
Glucose, Bld: 241 mg/dL — ABNORMAL HIGH (ref 65–99)
POTASSIUM: 4.7 mmol/L (ref 3.5–5.1)
Sodium: 134 mmol/L — ABNORMAL LOW (ref 135–145)
Total Protein: 6.4 g/dL — ABNORMAL LOW (ref 6.5–8.1)

## 2014-12-30 LAB — URINALYSIS, ROUTINE W REFLEX MICROSCOPIC
Bilirubin Urine: NEGATIVE
Glucose, UA: 250 mg/dL — AB
Ketones, ur: NEGATIVE mg/dL
LEUKOCYTES UA: NEGATIVE
NITRITE: NEGATIVE
Protein, ur: >300 mg/dL — AB
SPECIFIC GRAVITY, URINE: 1.016 (ref 1.005–1.030)
UROBILINOGEN UA: 0.2 mg/dL (ref 0.0–1.0)
pH: 6 (ref 5.0–8.0)

## 2014-12-30 LAB — URINE MICROSCOPIC-ADD ON

## 2014-12-30 LAB — CBC
HEMATOCRIT: 38.2 % — AB (ref 39.0–52.0)
Hemoglobin: 13.2 g/dL (ref 13.0–17.0)
MCH: 26.1 pg (ref 26.0–34.0)
MCHC: 34.6 g/dL (ref 30.0–36.0)
MCV: 75.6 fL — ABNORMAL LOW (ref 78.0–100.0)
PLATELETS: 267 10*3/uL (ref 150–400)
RBC: 5.05 MIL/uL (ref 4.22–5.81)
RDW: 14.7 % (ref 11.5–15.5)
WBC: 8.4 10*3/uL (ref 4.0–10.5)

## 2014-12-30 LAB — LIPASE, BLOOD: Lipase: 61 U/L — ABNORMAL HIGH (ref 22–51)

## 2014-12-30 NOTE — ED Notes (Signed)
C/o sob, L sided abd pain, and vomiting clear emesis x 3-4 weeks.  States he has been seen for same already and not any better.  Denies nausea at present.  Denies diarrhea, constipation, or urinary complaints.

## 2014-12-31 ENCOUNTER — Encounter (HOSPITAL_COMMUNITY): Payer: Self-pay | Admitting: General Practice

## 2014-12-31 ENCOUNTER — Inpatient Hospital Stay (HOSPITAL_COMMUNITY): Payer: BLUE CROSS/BLUE SHIELD

## 2014-12-31 ENCOUNTER — Inpatient Hospital Stay (HOSPITAL_COMMUNITY)
Admission: EM | Admit: 2014-12-31 | Discharge: 2015-01-08 | DRG: 682 | Disposition: A | Discharge status: Home / Self Care | Payer: BLUE CROSS/BLUE SHIELD | Attending: Family Medicine | Admitting: Family Medicine

## 2014-12-31 DIAGNOSIS — Z7982 Long term (current) use of aspirin: Secondary | ICD-10-CM | POA: Diagnosis not present

## 2014-12-31 DIAGNOSIS — E1142 Type 2 diabetes mellitus with diabetic polyneuropathy: Secondary | ICD-10-CM | POA: Diagnosis present

## 2014-12-31 DIAGNOSIS — R0602 Shortness of breath: Secondary | ICD-10-CM

## 2014-12-31 DIAGNOSIS — Z9889 Other specified postprocedural states: Secondary | ICD-10-CM | POA: Insufficient documentation

## 2014-12-31 DIAGNOSIS — N183 Chronic kidney disease, stage 3 (moderate): Secondary | ICD-10-CM | POA: Diagnosis present

## 2014-12-31 DIAGNOSIS — N179 Acute kidney failure, unspecified: Secondary | ICD-10-CM | POA: Diagnosis present

## 2014-12-31 DIAGNOSIS — N049 Nephrotic syndrome with unspecified morphologic changes: Secondary | ICD-10-CM | POA: Diagnosis present

## 2014-12-31 DIAGNOSIS — R809 Proteinuria, unspecified: Secondary | ICD-10-CM | POA: Insufficient documentation

## 2014-12-31 DIAGNOSIS — I5021 Acute systolic (congestive) heart failure: Secondary | ICD-10-CM | POA: Insufficient documentation

## 2014-12-31 DIAGNOSIS — Z6841 Body Mass Index (BMI) 40.0 and over, adult: Secondary | ICD-10-CM | POA: Diagnosis not present

## 2014-12-31 DIAGNOSIS — E8809 Other disorders of plasma-protein metabolism, not elsewhere classified: Secondary | ICD-10-CM | POA: Diagnosis not present

## 2014-12-31 DIAGNOSIS — R609 Edema, unspecified: Secondary | ICD-10-CM

## 2014-12-31 DIAGNOSIS — E1122 Type 2 diabetes mellitus with diabetic chronic kidney disease: Secondary | ICD-10-CM | POA: Diagnosis present

## 2014-12-31 DIAGNOSIS — E1069 Type 1 diabetes mellitus with other specified complication: Secondary | ICD-10-CM | POA: Diagnosis not present

## 2014-12-31 DIAGNOSIS — Z841 Family history of disorders of kidney and ureter: Secondary | ICD-10-CM | POA: Diagnosis not present

## 2014-12-31 DIAGNOSIS — I82409 Acute embolism and thrombosis of unspecified deep veins of unspecified lower extremity: Secondary | ICD-10-CM | POA: Diagnosis not present

## 2014-12-31 DIAGNOSIS — I428 Other cardiomyopathies: Secondary | ICD-10-CM | POA: Diagnosis present

## 2014-12-31 DIAGNOSIS — I82492 Acute embolism and thrombosis of other specified deep vein of left lower extremity: Secondary | ICD-10-CM | POA: Diagnosis present

## 2014-12-31 DIAGNOSIS — E785 Hyperlipidemia, unspecified: Secondary | ICD-10-CM | POA: Diagnosis present

## 2014-12-31 DIAGNOSIS — I10 Essential (primary) hypertension: Secondary | ICD-10-CM | POA: Diagnosis not present

## 2014-12-31 DIAGNOSIS — F17210 Nicotine dependence, cigarettes, uncomplicated: Secondary | ICD-10-CM | POA: Diagnosis present

## 2014-12-31 DIAGNOSIS — R109 Unspecified abdominal pain: Secondary | ICD-10-CM | POA: Insufficient documentation

## 2014-12-31 DIAGNOSIS — Z79899 Other long term (current) drug therapy: Secondary | ICD-10-CM | POA: Diagnosis not present

## 2014-12-31 DIAGNOSIS — R002 Palpitations: Secondary | ICD-10-CM | POA: Diagnosis not present

## 2014-12-31 DIAGNOSIS — E780 Pure hypercholesterolemia: Secondary | ICD-10-CM | POA: Diagnosis present

## 2014-12-31 DIAGNOSIS — I132 Hypertensive heart and chronic kidney disease with heart failure and with stage 5 chronic kidney disease, or end stage renal disease: Secondary | ICD-10-CM | POA: Diagnosis present

## 2014-12-31 DIAGNOSIS — Z794 Long term (current) use of insulin: Secondary | ICD-10-CM | POA: Diagnosis not present

## 2014-12-31 LAB — GLUCOSE, CAPILLARY
Glucose-Capillary: 271 mg/dL — ABNORMAL HIGH (ref 65–99)
Glucose-Capillary: 167 mg/dL — ABNORMAL HIGH (ref 65–99)
Glucose-Capillary: 194 mg/dL — ABNORMAL HIGH (ref 65–99)
Glucose-Capillary: 216 mg/dL — ABNORMAL HIGH (ref 65–99)

## 2014-12-31 LAB — BASIC METABOLIC PANEL
Anion gap: 5 (ref 5–15)
BUN: 35 mg/dL — AB (ref 6–20)
CHLORIDE: 108 mmol/L (ref 101–111)
CO2: 20 mmol/L — ABNORMAL LOW (ref 22–32)
Calcium: 7.9 mg/dL — ABNORMAL LOW (ref 8.9–10.3)
Creatinine, Ser: 4.42 mg/dL — ABNORMAL HIGH (ref 0.61–1.24)
GFR, EST AFRICAN AMERICAN: 17 mL/min — AB (ref >60)
GFR, EST NON AFRICAN AMERICAN: 15 mL/min — AB (ref >60)
GLUCOSE: 248 mg/dL — AB (ref 65–99)
POTASSIUM: 4.4 mmol/L (ref 3.5–5.1)
Sodium: 133 mmol/L — ABNORMAL LOW (ref 135–145)

## 2014-12-31 LAB — CBC
HEMATOCRIT: 34.4 % — AB (ref 39.0–52.0)
HEMOGLOBIN: 11.6 g/dL — AB (ref 13.0–17.0)
MCH: 25.4 pg — AB (ref 26.0–34.0)
MCHC: 33.7 g/dL (ref 30.0–36.0)
MCV: 75.3 fL — AB (ref 78.0–100.0)
PLATELETS: 241 10*3/uL (ref 150–400)
RBC: 4.57 MIL/uL (ref 4.22–5.81)
RDW: 14.6 % (ref 11.5–15.5)
WBC: 7.4 10*3/uL (ref 4.0–10.5)

## 2014-12-31 LAB — CK: Total CK: 127 U/L (ref 49–397)

## 2014-12-31 LAB — BRAIN NATRIURETIC PEPTIDE: B NATRIURETIC PEPTIDE 5: 371.8 pg/mL — AB (ref 0.0–100.0)

## 2014-12-31 MED ORDER — SODIUM CHLORIDE 0.9 % IJ SOLN
3.0000 mL | Freq: Two times a day (BID) | INTRAMUSCULAR | Status: DC
  Administered 2014-12-31 – 2015-01-04 (×7): 3 mL via INTRAVENOUS

## 2014-12-31 MED ORDER — PNEUMOCOCCAL VAC POLYVALENT 25 MCG/0.5ML IJ INJ
0.5000 mL | INJECTION | INTRAMUSCULAR | Status: DC
  Filled 2014-12-31: qty 0.5

## 2014-12-31 MED ORDER — IOHEXOL 300 MG/ML  SOLN: 25.0000 mL | Freq: Once | INTRAMUSCULAR | Status: DC | PRN

## 2014-12-31 MED ORDER — ACETAMINOPHEN 650 MG RE SUPP: 650.0000 mg | Freq: Four times a day (QID) | RECTAL | Status: DC | PRN

## 2014-12-31 MED ORDER — SODIUM CHLORIDE 0.9 % IV SOLN
INTRAVENOUS | Status: DC
  Administered 2014-12-31: 07:00:00 via INTRAVENOUS

## 2014-12-31 MED ORDER — INSULIN ASPART 100 UNIT/ML ~~LOC~~ SOLN
0.0000 [IU] | SUBCUTANEOUS | Status: DC
  Administered 2014-12-31 (×2): 2 [IU] via SUBCUTANEOUS
  Administered 2014-12-31: 3 [IU] via SUBCUTANEOUS
  Administered 2014-12-31: 5 [IU] via SUBCUTANEOUS
  Administered 2015-01-01: 7 [IU] via SUBCUTANEOUS
  Administered 2015-01-01: 2 [IU] via SUBCUTANEOUS
  Administered 2015-01-01: 1 [IU] via SUBCUTANEOUS
  Administered 2015-01-01: 5 [IU] via SUBCUTANEOUS
  Administered 2015-01-01: 3 [IU] via SUBCUTANEOUS
  Administered 2015-01-01: 2 [IU] via SUBCUTANEOUS

## 2014-12-31 MED ORDER — OXYCODONE HCL 5 MG PO TABS
5.0000 mg | ORAL_TABLET | ORAL | Status: DC | PRN
  Administered 2015-01-01 – 2015-01-03 (×2): 5 mg via ORAL
  Filled 2014-12-31 (×2): qty 1

## 2014-12-31 MED ORDER — ASPIRIN EC 81 MG PO TBEC
81.0000 mg | DELAYED_RELEASE_TABLET | Freq: Every day | ORAL | Status: DC
  Administered 2014-12-31 – 2015-01-01 (×2): 81 mg via ORAL
  Filled 2014-12-31 (×2): qty 1

## 2014-12-31 MED ORDER — HYDROMORPHONE HCL 1 MG/ML IJ SOLN
0.5000 mg | INTRAMUSCULAR | Status: DC | PRN
  Administered 2015-01-01: 1 mg via INTRAVENOUS
  Filled 2014-12-31 (×2): qty 1

## 2014-12-31 MED ORDER — FUROSEMIDE 10 MG/ML IJ SOLN
80.0000 mg | Freq: Three times a day (TID) | INTRAMUSCULAR | Status: DC
  Administered 2014-12-31 – 2015-01-02 (×5): 80 mg via INTRAVENOUS
  Filled 2014-12-31 (×5): qty 8

## 2014-12-31 MED ORDER — FUROSEMIDE 10 MG/ML IJ SOLN
80.0000 mg | Freq: Once | INTRAMUSCULAR | Status: AC
  Administered 2014-12-31: 80 mg via INTRAVENOUS
  Filled 2014-12-31: qty 8

## 2014-12-31 MED ORDER — ACETAMINOPHEN 325 MG PO TABS: 650.0000 mg | ORAL_TABLET | Freq: Four times a day (QID) | ORAL | Status: DC | PRN

## 2014-12-31 MED ORDER — ENOXAPARIN SODIUM 40 MG/0.4ML ~~LOC~~ SOLN
40.0000 mg | SUBCUTANEOUS | Status: DC
  Administered 2014-12-31: 40 mg via SUBCUTANEOUS
  Filled 2014-12-31: qty 0.4

## 2014-12-31 MED ORDER — ENOXAPARIN SODIUM 150 MG/ML ~~LOC~~ SOLN
1.0000 mg/kg | Freq: Once | SUBCUTANEOUS | Status: AC
  Administered 2014-12-31: 135 mg via SUBCUTANEOUS
  Filled 2014-12-31: qty 1

## 2014-12-31 MED ORDER — INSULIN DETEMIR 100 UNIT/ML ~~LOC~~ SOLN
70.0000 [IU] | Freq: Every day | SUBCUTANEOUS | Status: DC
  Administered 2014-12-31 – 2015-01-01 (×2): 70 [IU] via SUBCUTANEOUS
  Filled 2014-12-31 (×3): qty 0.7

## 2014-12-31 MED ORDER — HYDRALAZINE HCL 25 MG PO TABS
25.0000 mg | ORAL_TABLET | Freq: Four times a day (QID) | ORAL | Status: DC | PRN
Start: 2014-12-31 — End: 2015-01-01
  Administered 2014-12-31 – 2015-01-01 (×2): 25 mg via ORAL
  Filled 2014-12-31 (×3): qty 1

## 2014-12-31 MED ORDER — ONDANSETRON HCL 4 MG/2ML IJ SOLN: 4.0000 mg | Freq: Three times a day (TID) | INTRAMUSCULAR | Status: AC | PRN

## 2014-12-31 MED ORDER — ALUM & MAG HYDROXIDE-SIMETH 200-200-20 MG/5ML PO SUSP
30.0000 mL | Freq: Four times a day (QID) | ORAL | Status: DC | PRN
  Administered 2015-01-01 – 2015-01-03 (×2): 30 mL via ORAL
  Filled 2014-12-31 (×2): qty 30

## 2014-12-31 MED ORDER — ENOXAPARIN SODIUM 80 MG/0.8ML ~~LOC~~ SOLN: 70.0000 mg | SUBCUTANEOUS | Status: DC

## 2014-12-31 NOTE — Progress Notes (Signed)
Called to room by patient for evaluation to give breathing treatments. Patient has clear BBS and no wheezing with good air movement throughout. Patient explained their issues and said they wake up SOB and feel like they haven't been breathing, and are continuously tired throughout the day. I explained that everything he mentioned points to possibly having Obstructed Sleep Apnea. He explained his dad has that as well. Told him a breathing treatment wouldn't help anything he has going on right now. Told him I would tell His Nurse and Leave note for MD to further evaluate issue and possibly get him a sleep study, because without sleep study we can't use CPAP or BIPAP on him at this time for sleeping. Patient understood and said he is willing to try whatever it takes to feel better.

## 2014-12-31 NOTE — Consult Note (Signed)
Renal Service Consult Note [facility]  Halfway 12/31/2014 [place] Requesting Physician:  Dr Darrick Meigs  Reason for Consult: Renal failure, new onset HPI: The patient is a 49 y.o. year-old with hx of DM, HTN and HL who presented to ED yesterday with reports of SOB, left-sided abd pain and vomiting for 3-4 weeks. Also leg swelling worsening over 1 month period.  No f/c/x, +foamy urine.     Seen in ED on 7/30 about 2 weeks ago with fevers, myalgias , HA for 2 weeks and abcess of the "left underarm" with high BS readings at home. +hx of multiple abcesses. Small pustule on exam, no drainable abcess. Rec'd 1L IVF and insulin for BS 385. UA and CBC were normal and he was dc'd home on po abx (doxy) for possible MRSA. Creat was 1.58.  AT the time was also taking HCTZ and ibuprofen.   Went home and took doxy and fevers subsided and the pustule under left arm improved. Developed SOB however with orthopnea now and unable to lie down due to SOB.  No prod cough, fevers lately and no CP. Some abd pain, no dysuria , hematuria. 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   Home meds > doxycycline, HCTZ, novolog, ECASA, levemir, metformin, ibuprofen (last used 3-4 days ago)  UA in 2009 > neg protein, 1.036, no rbc/ wbc UA on 12/17/14 > 3-6 wbc, 0-2 rbc, >300 prot, large Hb, 1.033 UA on 12/30/14 > 0-2 wbc, 7-10 rbc, > 300 prot, 1.016, mod Hb  Renal US today > 10.5/ 12.3 cm with no hydro  Date   Creat  eGFR  Proteinuria 07/29/07 1.14  >60  none 08/03/14 1.20   12/17/14 1.58  51  > 300 12/30/14 4.37    > 300 12/31/14 4.42  15   Past Medical History  Past Medical History  Diagnosis Date  . Diabetes mellitus   . Hypertension   . High cholesterol    Past Surgical History History reviewed. No pertinent past surgical history. Family History History reviewed. No pertinent family history. Social History  reports that he has been smoking Cigarettes.  He has a 7.5  pack-year smoking history. He does not have any smokeless tobacco history on file. He reports that he drinks alcohol. He reports that he does not use illicit drugs. Allergies No Known Allergies Home medications Prior to Admission medications   Medication Sig Start Date End Date Taking? Authorizing Provider  aspirin EC 81 MG tablet Take 81 mg by mouth daily.   Yes Historical Provider, MD  insulin detemir (LEVEMIR) 100 UNIT/ML injection Inject 70 Units into the skin at bedtime.   Yes Historical Provider, MD  metFORMIN (GLUCOPHAGE) 1000 MG tablet Take 1 tablet (1,000 mg total) by mouth 2 (two) times daily. 02/12/12  Yes Carlisle Cater, PA-C  doxycycline (VIBRAMYCIN) 50 MG capsule Take 2 capsules (100 mg total) by mouth 2 (two) times daily. For 7 days Patient not taking: Reported on 12/31/2014 12/17/14   Jule Ser, DO  hydrochlorothiazide (HYDRODIURIL) 25 MG tablet Take 1 tablet (25 mg total) by mouth daily. Patient not taking: Reported on 08/03/2014 03/27/12   Hazel Sams, PA-C  insulin aspart (NOVOLOG) 100 UNIT/ML injection Inject 23 Units into the skin 2 (two) times daily at 10 AM and 5 PM. Patient not taking: Reported on 08/03/2014 03/27/12   Hazel Sams, PA-C   Liver Function Tests  Recent Labs Lab 12/30/14 2123  AST 40  ALT 30  ALKPHOS 111  BILITOT 0.8  PROT 6.4*  ALBUMIN 1.9*    Recent Labs Lab 12/30/14 2123  LIPASE 61*   CBC  Recent Labs Lab 12/30/14 2123 12/31/14 0620  WBC 8.4 7.4  HGB 13.2 11.6*  HCT 38.2* 34.4*  MCV 75.6* 75.3*  PLT 267 962   Basic Metabolic Panel  Recent Labs Lab 12/30/14 2123 12/31/14 0620  NA 134* 133*  K 4.7 4.4  CL 109 108  CO2 17* 20*  GLUCOSE 241* 248*  BUN 38* 35*  CREATININE 4.37* 4.42*  CALCIUM 8.3* 7.9*    Filed Vitals:   12/31/14 0215 12/31/14 0300 12/31/14 0500 12/31/14 0830  BP: 151/96 142/93 143/71 156/136  Pulse: 103 109 105 107  Temp:  98.5 F (36.9 C) 98.8 F (37.1 C) 98.5 F (36.9 C)  TempSrc:  Oral Oral  Oral  Resp: 20 18 18 18   Weight:  139.935 kg (308 lb 8 oz)    SpO2: 99% 94% 97% 96%   Exam Alert, no distress, can't lie flat for long has to sit up to breathe No rash, cyanosis or gangrene Sclera anicteric, throat clear No jvd or LAN Chest soft rales bilat bases RRR no MRG, non displaced Abd very obese, no gross ascites, no HSM, +BS and nontender No CVAT GU normal male Ext bilat pretib edema and pedal edema 1-2+ Neuro is alert, nf ox 3   UA in 2009 > neg protein, 1.036, no rbc/ wbc UA on 12/17/14 > 3-6 wbc, 0-2 rbc, >300 prot, large Hb, 1.033 UA on 12/30/14 > 0-2 wbc, 7-10 rbc, > 300 prot, 1.016, mod Hb  Renal US today > 10.5/ 12.3 cm with no hydro CT abd today > bilat perinephric stranding and fluid seen, with stranding tracking along the courses of the ureters bilaterally also. Raises concern for bilat pyelonephritis. No hydro or stones. Bladder distended, prostate normal in size. CXR - +vasc congestion and CM   Assessment: 1. Acute on CKD 3 - in patient just completed course of po abx (doxycline) along with taking NSAID's for L arm abcess. Creat up to 4 with vol excess/ orthopnea/ CHF by CXR (early ) and new HTN. CT scan showing inflammation of both kidneys and ureters, no stones, no hydro.  Has proteinuria > 300 which could be related to chronic diabetic kidney disease, or related to acute glomerular injury. Acute renal failure could be AIN from meds (doxy, nsaid's), decomp CHF,  ATN or immune-complex GN related to infection which seems less likely. Will plan urine microscopy, cont IV lasix until asymptomatic, dc IVF"s. Further lab w/u may be needed. For now check complements and UPC ratio. Will follow.  2. DM x 20 years 3. HTN new onset 4. Vol excess / orthopnea/ pulm edema - as above 5. Obesity   Plan- as above  Kelly Splinter MD (pgr) [phone]    [phone] 12/31/2014, 5:11 PM

## 2014-12-31 NOTE — H&P (Addendum)
[facility] Admission History and Physical       Charles Montgomery JXB:[id number] DOB: 1965/08/01 DOA: 12/31/2014  Referring physician: EDP PCP: Charles Housekeeper, MD  Specialists:   Chief Complaint: SOB and Lower Leg Swelling  HPI: Charles Montgomery is a 49 y.o. male with a history of HTN, DM2, and Hyperlipidemia who presents tot he ED with complaints of worsening SOB and swelling of both lower legs worsening over 1 month.  He also reports having Left Flank and ABD area pain x 1 month and recently he has had nausea and vomiting.  He denies any gross hematuria, fevers or chills.  He has noticed that his urine has been foamy.      Review of Systems:  Constitutional: No Weight Loss, No Weight Gain, Night Sweats, Fevers, Chills, Dizziness, Light Headedness, Fatigue, or Generalized Weakness HEENT: No Headaches, Difficulty Swallowing,Tooth/Dental Problems,Sore Throat,  No Sneezing, Rhinitis, Ear Ache, Nasal Congestion, or Post Nasal Drip,  Cardio-vascular:  No Chest pain, Orthopnea, PND, +Edema in Lower Extremities, Anasarca, Dizziness, Palpitations  Resp: +Dyspnea, No DOE, No Productive Cough, No Non-Productive Cough, No Hemoptysis, No Wheezing.    GI: No Heartburn, Indigestion, +Abdominal Pain, Nausea, Vomiting, Diarrhea, Constipation, Hematemesis, Hematochezia, Melena, Change in Bowel Habits,  Loss of Appetite  GU: No Dysuria, No Change in Color of Urine, No Urgency or Urinary Frequency, + Flank pain.  Musculoskeletal: No Joint Pain or Swelling, No Decreased Range of Motion, No Back Pain.  Neurologic: No Syncope, No Seizures, Muscle Weakness, Paresthesia, Vision Disturbance or Loss, No Diplopia, No Vertigo, No Difficulty Walking,  Skin: No Rash or Lesions. Psych: No Change in Mood or Affect, No Depression or Anxiety, No Memory loss, No Confusion, or Hallucinations   Past Medical History  Diagnosis Date  . Diabetes mellitus   . Hypertension   . High cholesterol      History  reviewed. No pertinent past surgical history.    Prior to Admission medications   Medication Sig Start Date End Date Taking? Authorizing Provider  aspirin EC 81 MG tablet Take 81 mg by mouth daily.   Yes Historical Provider, MD  insulin detemir (LEVEMIR) 100 UNIT/ML injection Inject 70 Units into the skin at bedtime.   Yes Historical Provider, MD  metFORMIN (GLUCOPHAGE) 1000 MG tablet Take 1 tablet (1,000 mg total) by mouth 2 (two) times daily. 02/12/12  Yes Charles Crigler, PA-C  doxycycline (VIBRAMYCIN) 50 MG capsule Take 2 capsules (100 mg total) by mouth 2 (two) times daily. For 7 days Patient not taking: Reported on 12/31/2014 12/17/14   Charles Burly, DO  hydrochlorothiazide (HYDRODIURIL) 25 MG tablet Take 1 tablet (25 mg total) by mouth daily. Patient not taking: Reported on 08/03/2014 03/27/12   Charles Andrew, PA-C  insulin aspart (NOVOLOG) 100 UNIT/ML injection Inject 23 Units into the skin 2 (two) times daily at 10 AM and 5 PM. Patient not taking: Reported on 08/03/2014 03/27/12   Charles Andrew, PA-C     No Known Allergies  Social History:  reports that he has been smoking Cigarettes.  He does not have any smokeless tobacco history on file. He reports that he drinks alcohol. He reports that he does not use illicit drugs.    No family history on file.     Physical Exam:  GEN:  Pleasant Morbidly Obese 49 y.o.  African American male examined and in no acute distress; cooperative with exam Filed Vitals:   12/31/14 0130 12/31/14 0145 12/31/14 0200 12/31/14 0215  BP: 152/98 154/92 155/94 151/96  Pulse: 104 105 102 103  Temp:      TempSrc:      Resp: Weight:      SpO2: 99% 100% 100% 99%   Blood pressure 151/96, pulse 103, temperature 99.4 F (37.4 C), temperature source Oral, resp. rate 20, weight 136 kg (299 lb 13.2 oz), SpO2 99 %. PSYCH: He is alert and oriented x4; does not appear anxious does not appear depressed; affect is normal HEENT: Normocephalic and Atraumatic,  Mucous membranes pink; PERRLA; EOM intact; Fundi:  Benign;  No scleral icterus, Nares: Patent, Oropharynx: Clear, Edentulous or Fair Dentition,    Neck:  FROM, No Cervical Lymphadenopathy nor Thyromegaly or Carotid Bruit; No JVD; Breasts:: Not examined CHEST WALL: No tenderness CHEST: Normal respiration, clear to auscultation bilaterally HEART: Regular rate and rhythm; no murmurs rubs or gallops BACK: No kyphosis or scoliosis; No CVA tenderness ABDOMEN: Positive Bowel Sounds, Obese, Soft Non-Tender, No Rebound or Guarding; No Masses, No Organomegaly, No Pannus; No Intertriginous candida. Rectal Exam: Not done EXTREMITIES: No  Cyanosis, Clubbing, 2+ Edema; No Ulcerations. Genitalia: not examined PULSES: 2+ and symmetric SKIN: Normal hydration no rash or ulceration CNS:  Alert and Oriented x 4, No Focal Deficits Vascular: pulses palpable throughout    Labs on Admission:  Basic Metabolic Panel:  Recent Labs Lab 12/30/14 2123  NA 134*  K 4.7  CL 109  CO2 17*  GLUCOSE 241*  BUN 38*  CREATININE 4.37*  CALCIUM 8.3*   Liver Function Tests:  Recent Labs Lab 12/30/14 2123  AST 40  ALT 30  ALKPHOS 111  BILITOT 0.8  PROT 6.4*  ALBUMIN 1.9*    Recent Labs Lab 12/30/14 2123  LIPASE 61*   No results for input(s): AMMONIA in the last 168 hours. CBC:  Recent Labs Lab 12/30/14 2123  WBC 8.4  HGB 13.2  HCT 38.2*  MCV 75.6*  PLT 267   Cardiac Enzymes: No results for input(s): CKTOTAL, CKMB, CKMBINDEX, TROPONINI in the last 168 hours.  BNP (last 3 results)  Recent Labs  12/30/14 2123  BNP 371.8*    ProBNP (last 3 results) No results for input(s): PROBNP in the last 8760 hours.  CBG: No results for input(s): GLUCAP in the last 168 hours.  Radiological Exams on Admission: Dg Chest 2 View  12/30/2014   CLINICAL DATA:  Shortness breath for 1 month.  Diabetes.  EXAM: CHEST - 2 VIEW  COMPARISON:  Two-view chest x-ray 03/24/2012  FINDINGS: The heart is enlarged.  Mild pulmonary vascular congestion is present. No focal airspace disease is present. There are no effusions.  IMPRESSION: Cardiomegaly and mild pulmonary vascular congestion.   Electronically Signed   By: Charles Montgomery M.D.   On: 12/30/2014 21:46     EKG: Independently reviewed. Sinus Tachycardia rate =118, + LVH chnages   Assessment/Plan:   49 y.o. male with  Active Problems:   1.    Acute renal failure syndrome   Consult Renal in AM   Hold HCTZ Rx   Monitor BUN/Cr       2.    Nephrotic syndrome   Send 24 hour Urine   Urine Culture   Renal US in AM   Fluid and Salt Restriction   Consult Renal in AM   Consider ACE Inhibitor Rx if Appropriate    3.    Hypoalbuminemia- due to Renal loss   Monitor     4.    Peripheral edema   Gentle  IV Lasix     5.     SOB- Due to Acute CHF   2D ECHO in AM   IV Diuresis     6.   ABD Pain   CT ABD and Pelvis Done and Pending   PRN IV Dilaudid for Pain        7.    Diabetes mellitus   SSI Coverage PRN   Check HbA1C     8.    Hypertension   IV Hydralazine PRN     9.    DVT Prophylaxis   Lovenox          Code Status:     FULL CODE       Family Communication:   No Family Present    Disposition Plan:    Inpatient Status        Time spent:  74 Minutes      Ron Parker [facility] Pager [phone]   If 7AM -7PM Please Contact the Day Rounding Team MD for [facility]  If 7PM-7AM, Please Contact Night-Floor Coverage  [email or web address] Password [email or web address] 12/31/2014, 3:20 AM     ADDENDUM:   Patient was seen and examined on 12/31/2014

## 2014-12-31 NOTE — ED Notes (Signed)
Transporting patient to new room assignment. 

## 2014-12-31 NOTE — Progress Notes (Addendum)
Subjective: Patient admitted this morning she did relate and be by Dr. Lovell Sheehan 49 year old male with a history of hypertension, diabetes mellitus came with worsening shortness of breath and swelling of both lower extremities. Patient found to be in acute kidney injury. This morning still patient complains of shortness of breath. Blood pressure elevated Filed Vitals:   12/31/14 0830  BP: 156/136  Pulse: 107  Temp: 98.5 F (36.9 C)  Resp: 18    Chest: Clear Bilaterally Heart : S1S2 RRR Abdomen: Soft, nontender Ext : No edema Neuro: Alert, oriented x 3  A/P Hypotensive urgency Acute kidney injury Nephrotic syndrome Dyspnea  Will start hydralazine 25 mg by mouth every 6 hours when necessary for BP greater than 160/100 Patient received Lasix 80 mg IV in the ED Nephrology consultation     Meredeth Ide [facility] Pager- [phone]

## 2014-12-31 NOTE — ED Notes (Signed)
CT called, pt due to have CT scan at 3:45, pt to be transfer to 6E. CT department notified.

## 2014-12-31 NOTE — ED Notes (Signed)
Dr Lovell Sheehan Hospitalist at bedside.

## 2014-12-31 NOTE — ED Provider Notes (Signed)
CSN: [id number]     Arrival date & time 12/30/14  2057 History  This chart was scribed for Dione Booze, MD by Tanda Rockers, ED Scribe. This patient was seen in room B16C/B16C and the patient's care was started at 1:11 AM.   Chief Complaint  Patient presents with  . Abdominal Pain  . Shortness of Breath   The history is provided by the patient. No language interpreter was used.     HPI Comments: Ryen Rhames is a 49 y.o. male with hx DM, HTN, and HLD who presents to the Emergency Department complaining of shortness of breath x 1 month, worsening recently. The shortness of breath is modifed with sitting up and exacerbated with movement. Pt also complains of LUQ pain x 1 month. He notes he hasn't been able to keep anything down due to having nausea, vomiting, and diarrhea. He also reports not eating as well as he normally does. He notes increased left leg swelling and pain that he noticed while in the waiting room tonight. He also mentions having subjective fever, chills, and sweats as well for the past month. Pt was seen in ED on 12/17/2014 for similar symptoms. He denies difficulty urinating or any other associated symptoms. He does mention his father had a kidney transplant recently.   Past Medical History  Diagnosis Date  . Diabetes mellitus   . Hypertension   . High cholesterol    History reviewed. No pertinent past surgical history. No family history on file. Social History  Substance Use Topics  . Smoking status: Current Every Day Smoker    Types: Cigarettes  . Smokeless tobacco: None  . Alcohol Use: Yes     Comment: rarely    Review of Systems  Constitutional: Positive for fever, chills, diaphoresis and appetite change.  Respiratory: Positive for shortness of breath.   Gastrointestinal: Positive for nausea, vomiting, abdominal pain and diarrhea.  Genitourinary: Negative for decreased urine volume and difficulty urinating.  Musculoskeletal: Positive for joint swelling and  arthralgias.  All other systems reviewed and are negative.  Allergies  Review of patient's allergies indicates no known allergies.  Home Medications   Prior to Admission medications   Medication Sig Start Date End Date Taking? Authorizing Provider  aspirin EC 81 MG tablet Take 81 mg by mouth daily.    Historical Provider, MD  doxycycline (VIBRAMYCIN) 50 MG capsule Take 2 capsules (100 mg total) by mouth 2 (two) times daily. For 7 days 12/17/14   Gwynn Burly, DO  esomeprazole (NEXIUM) 20 MG capsule Take 20 mg by mouth daily at 12 noon.    Historical Provider, MD  hydrochlorothiazide (HYDRODIURIL) 25 MG tablet Take 1 tablet (25 mg total) by mouth daily. Patient not taking: Reported on 08/03/2014 03/27/12   Ivonne Andrew, PA-C  ibuprofen (ADVIL,MOTRIN) 200 MG tablet Take 400 mg by mouth every 6 (six) hours as needed for fever.    Historical Provider, MD  insulin aspart (NOVOLOG) 100 UNIT/ML injection Inject 23 Units into the skin 2 (two) times daily at 10 AM and 5 PM. Patient not taking: Reported on 08/03/2014 03/27/12   Ivonne Andrew, PA-C  insulin detemir (LEVEMIR) 100 UNIT/ML injection Inject 70 Units into the skin at bedtime.    Historical Provider, MD  metFORMIN (GLUCOPHAGE) 1000 MG tablet Take 1 tablet (1,000 mg total) by mouth 2 (two) times daily. 02/12/12   Renne Crigler, PA-C   Triage Vitals: BP 174/114 mmHg  Pulse 116  Temp(Src) 99.4 F (37.4 C) ([id number])  Resp 20  SpO2 98%   Physical Exam  Constitutional: He is oriented to person, place, and time. He appears well-developed. No distress.  Obese  HENT:  Head: Normocephalic and atraumatic.  Eyes: Conjunctivae and EOM are normal. Pupils are equal, round, and reactive to light.  Neck: Normal range of motion. Neck supple. No JVD present.  Cardiovascular: Normal rate, regular rhythm and normal heart sounds.   No murmur heard. Pulmonary/Chest: Effort normal and breath sounds normal. He has no wheezes. He has no rales. He exhibits no  tenderness.  Abdominal: Soft. He exhibits no distension and no mass. There is tenderness. There is no rebound and no guarding.  Moderate tenderness left mid abdomen Bowel sounds decreased  Musculoskeletal: Normal range of motion. He exhibits edema. He exhibits no tenderness.  3+ edema which is asymmetric; left calf circumference 4 cm greater than right calf circumference No tenderness Negative Homan's sign  Lymphadenopathy:    He has no cervical adenopathy.  Neurological: He is alert and oriented to person, place, and time. No cranial nerve deficit. He exhibits normal muscle tone. Coordination normal.  Skin: Skin is warm and dry. No rash noted.  Psychiatric: He has a normal mood and affect. His behavior is normal. Judgment and thought content normal.  Nursing note and vitals reviewed.   ED Course  Procedures (including critical care time)  DIAGNOSTIC STUDIES: Oxygen Saturation is 98% on RA, normal by my interpretation.    COORDINATION OF CARE: 1:20 AM-Discussed treatment plan which includes CT A/P and consult to hospitalist for admission with pt at bedside and pt agreed to plan.   Labs Review Results for orders placed or performed during the hospital encounter of 12/31/14  Lipase, blood  Result Value Ref Range   Lipase 61 (H) 22 - 51 U/L  Comprehensive metabolic panel  Result Value Ref Range   Sodium 134 (L) 135 - 145 mmol/L   Potassium 4.7 3.5 - 5.1 mmol/L   Chloride 109 101 - 111 mmol/L   CO2 17 (L) 22 - 32 mmol/L   Glucose, Bld 241 (H) 65 - 99 mg/dL   BUN 38 (H) 6 - 20 mg/dL   Creatinine, Ser 1.61 (H) 0.61 - 1.24 mg/dL   Calcium 8.3 (L) 8.9 - 10.3 mg/dL   Total Protein 6.4 (L) 6.5 - 8.1 g/dL   Albumin 1.9 (L) 3.5 - 5.0 g/dL   AST 40 15 - 41 U/L   ALT 30 17 - 63 U/L   Alkaline Phosphatase 111 38 - 126 U/L   Total Bilirubin 0.8 0.3 - 1.2 mg/dL   GFR calc non Af Amer 15 (L) >60 mL/min   GFR calc Af Amer 17 (L) >60 mL/min   Anion gap 8 5 - 15  CBC  Result Value Ref  Range   WBC 8.4 4.0 - 10.5 K/uL   RBC 5.05 4.22 - 5.81 MIL/uL   Hemoglobin 13.2 13.0 - 17.0 g/dL   HCT 09.6 (L) 04.5 - 40.9 %   MCV 75.6 (L) 78.0 - 100.0 fL   MCH 26.1 26.0 - 34.0 pg   MCHC 34.6 30.0 - 36.0 g/dL   RDW 81.1 91.4 - 78.2 %   Platelets 267 150 - 400 K/uL  Urinalysis, Routine w reflex microscopic (not at [facility])  Result Value Ref Range   Color, Urine YELLOW YELLOW   APPearance CLOUDY (A) CLEAR   Specific Gravity, Urine 1.016 1.005 - 1.030   pH 6.0 5.0 - 8.0   Glucose, UA  250 (A) NEGATIVE mg/dL   Hgb urine dipstick MODERATE (A) NEGATIVE   Bilirubin Urine NEGATIVE NEGATIVE   Ketones, ur NEGATIVE NEGATIVE mg/dL   Protein, ur >563 (A) NEGATIVE mg/dL   Urobilinogen, UA 0.2 0.0 - 1.0 mg/dL   Nitrite NEGATIVE NEGATIVE   Leukocytes, UA NEGATIVE NEGATIVE  Urine microscopic-add on  Result Value Ref Range   WBC, UA 0-2 <3 WBC/hpf   RBC / HPF 7-10 <3 RBC/hpf   Bacteria, UA RARE RARE   Casts HYALINE CASTS (A) NEGATIVE  I-stat troponin, ED  Result Value Ref Range   Troponin i, poc 0.05 0.00 - 0.08 ng/mL   Comment 3           Imaging Review Ct Abdomen Pelvis Wo Contrast  12/31/2014   CLINICAL DATA:  Subacute onset of left upper quadrant and left lower quadrant abdominal pain. Nausea, diarrhea and vomiting. Initial encounter.  EXAM: CT ABDOMEN AND PELVIS WITHOUT CONTRAST  TECHNIQUE: Multidetector CT imaging of the abdomen and pelvis was performed following the standard protocol without IV contrast.  COMPARISON:  Lumbar spine radiographs performed 08/30/2013  FINDINGS: Mild bibasilar atelectasis is noted.  The liver and spleen are unremarkable in appearance. The gallbladder is within normal limits. The pancreas and adrenal glands are unremarkable.  Bilateral perinephric stranding and fluid are seen, with stranding tracking along the courses of the ureters bilaterally. Retroperitoneal stranding is also seen. This raises concern for bilateral pyelonephritis. There is no evidence of  hydronephrosis. No obstructing ureteral stones are seen.  No free fluid is identified. The small bowel is unremarkable in appearance. The stomach is within normal limits. No acute vascular abnormalities are seen.  The appendix is normal in caliber and contains air, without evidence of appendicitis. Contrast progresses to the level of the splenic flexure of the colon. The colon is partially filled with stool and is unremarkable in appearance.  The bladder is moderately distended and grossly unremarkable. The prostate remains normal in size. No inguinal lymphadenopathy is seen.  No acute osseous abnormalities are identified. Facet disease is noted at the lower lumbar spine.  IMPRESSION: 1. Bilateral perinephric stranding and fluid, with stranding tracking along the courses of the ureters, and retroperitoneal stranding. This is concerning for bilateral pyelonephritis. Would correlate for associated symptoms and lab findings. No evidence of hydronephrosis. 2. Mild bibasilar atelectasis noted.   Electronically Signed   By: Roanna Raider M.D.   On: 12/31/2014 04:34   Dg Chest 2 View  12/30/2014   CLINICAL DATA:  Shortness breath for 1 month.  Diabetes.  EXAM: CHEST - 2 VIEW  COMPARISON:  Two-view chest x-ray 03/24/2012  FINDINGS: The heart is enlarged. Mild pulmonary vascular congestion is present. No focal airspace disease is present. There are no effusions.  IMPRESSION: Cardiomegaly and mild pulmonary vascular congestion.   Electronically Signed   By: Marin Roberts M.D.   On: 12/30/2014 21:46   I, Dione Booze, personally reviewed and evaluated these images and lab results as part of my medical decision-making.   EKG Interpretation   Date/Time:  Friday December 30 2014 21:03:50 EDT Ventricular Rate:  118 PR Interval:  148 QRS Duration: 100 QT Interval:  338 QTC Calculation: 473 R Axis:   -32 Text Interpretation:  Sinus tachycardia Left axis deviation Moderate  voltage criteria for LVH, may be  normal variant Abnormal ECG Nonspecific T  wave abnormality When compared with ECG of 04/17/2004, Nonspecific T wave  abnormality is now Present Confirmed by Preston Fleeting  MD,  Lillyrose Reitan ([id number]) on  12/31/2014 1:10:03 AM      MDM   Final diagnoses:  Acute renal failure, unspecified acute renal failure type  Peripheral edema  Abdominal pain, unspecified abdominal location  Hypoalbuminemia    Nausea and vomiting of uncertain cause. Laboratory evaluation shows new renal failure. Creatinine has jumped from 1.58 on July 30 to 4.37 today. BUN is only 38 suggesting this is intrinsic renal disease and not dehydration. Mild elevation of lipase is felt to be related to renal failure and not clinically significant. Low CO2 is also felt to be related to renal failure. It is noted that he has a very low albumen 1.9. Last albumen had been normal but was several years ago. Urine does have severe proteinuria and I am suspicious of nephrotic syndrome. Asymmetric leg swelling is concerning for DVT. If he does have nephrotic syndrome, he would be at increased risk for DVT. Is given a dose of an occipital and 31 need to have venous Dopplers checked in the morning. Abdominal pain is being evaluated with CT scan without contrast. This will also show if there is any evidence of urinary tract obstruction. Case is discussed with Dr. Lovell Sheehan of [facility] who agrees to admit the patient.   I personally performed the services described in this documentation, which was scribed in my presence. The recorded information has been reviewed and is accurate.       Dione Booze, MD 12/31/14 [phone]

## 2015-01-01 ENCOUNTER — Inpatient Hospital Stay (HOSPITAL_COMMUNITY): Payer: BLUE CROSS/BLUE SHIELD

## 2015-01-01 DIAGNOSIS — N189 Chronic kidney disease, unspecified: Secondary | ICD-10-CM

## 2015-01-01 DIAGNOSIS — R609 Edema, unspecified: Secondary | ICD-10-CM

## 2015-01-01 DIAGNOSIS — E1122 Type 2 diabetes mellitus with diabetic chronic kidney disease: Secondary | ICD-10-CM

## 2015-01-01 DIAGNOSIS — N049 Nephrotic syndrome with unspecified morphologic changes: Secondary | ICD-10-CM

## 2015-01-01 LAB — BASIC METABOLIC PANEL
ANION GAP: 9 (ref 5–15)
BUN: 38 mg/dL — AB (ref 6–20)
CHLORIDE: 104 mmol/L (ref 101–111)
CO2: 21 mmol/L — ABNORMAL LOW (ref 22–32)
CREATININE: 4.32 mg/dL — AB (ref 0.61–1.24)
Calcium: 8.4 mg/dL — ABNORMAL LOW (ref 8.9–10.3)
GFR, EST AFRICAN AMERICAN: 17 mL/min — AB (ref >60)
GFR, EST NON AFRICAN AMERICAN: 15 mL/min — AB (ref >60)
GLUCOSE: 267 mg/dL — AB (ref 65–99)
Potassium: 4.3 mmol/L (ref 3.5–5.1)
Sodium: 134 mmol/L — ABNORMAL LOW (ref 135–145)

## 2015-01-01 LAB — URINE CULTURE
Culture: 6000
Special Requests: NORMAL

## 2015-01-01 LAB — PROTEIN / CREATININE RATIO, URINE
CREATININE, URINE: 61.97 mg/dL
Protein Creatinine Ratio: 13.05 mg/mg{Cre} — ABNORMAL HIGH (ref 0.00–0.15)
Total Protein, Urine: 809 mg/dL

## 2015-01-01 LAB — GLUCOSE, CAPILLARY
GLUCOSE-CAPILLARY: 135 mg/dL — AB (ref 65–99)
GLUCOSE-CAPILLARY: 322 mg/dL — AB (ref 65–99)
Glucose-Capillary: 190 mg/dL — ABNORMAL HIGH (ref 65–99)
Glucose-Capillary: 194 mg/dL — ABNORMAL HIGH (ref 65–99)
Glucose-Capillary: 239 mg/dL — ABNORMAL HIGH (ref 65–99)
Glucose-Capillary: 244 mg/dL — ABNORMAL HIGH (ref 65–99)

## 2015-01-01 MED ORDER — TECHNETIUM TC 99M DIETHYLENETRIAME-PENTAACETIC ACID: 43.4000 | Freq: Once | INTRAVENOUS | Status: DC | PRN

## 2015-01-01 MED ORDER — HEPARIN (PORCINE) IN NACL 100-0.45 UNIT/ML-% IJ SOLN: 1700.0000 [IU]/h | INTRAMUSCULAR | Status: DC

## 2015-01-01 MED ORDER — TECHNETIUM TO 99M ALBUMIN AGGREGATED
6.2700 | Freq: Once | INTRAVENOUS | Status: AC | PRN
  Administered 2015-01-01: 6.27 via INTRAVENOUS

## 2015-01-01 MED ORDER — CLONIDINE HCL 0.1 MG PO TABS: 0.1000 mg | ORAL_TABLET | Freq: Three times a day (TID) | ORAL | Status: DC | PRN

## 2015-01-01 MED ORDER — SODIUM CHLORIDE 0.9 % IV SOLN
500.0000 mg | INTRAVENOUS | Status: DC
  Administered 2015-01-01: 500 mg via INTRAVENOUS
  Filled 2015-01-01 (×2): qty 4

## 2015-01-01 MED ORDER — METOPROLOL TARTRATE 25 MG PO TABS
25.0000 mg | ORAL_TABLET | Freq: Two times a day (BID) | ORAL | Status: DC
  Administered 2015-01-01 – 2015-01-02 (×2): 25 mg via ORAL
  Filled 2015-01-01 (×2): qty 1

## 2015-01-01 MED ORDER — HYDRALAZINE HCL 25 MG PO TABS
25.0000 mg | ORAL_TABLET | Freq: Three times a day (TID) | ORAL | Status: DC
  Administered 2015-01-01 – 2015-01-08 (×21): 25 mg via ORAL
  Filled 2015-01-01 (×19): qty 1

## 2015-01-01 NOTE — Progress Notes (Addendum)
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 Creatinine went up to 4.37 from baseline of 1.58 on 12/17/2014. Patient was given Lasix yesterday, nephrology has been consulted and workup for the acute kidney injury is underway. Renal ultrasound was normal  Dyspnea- likely due to CHF, patient was started on IV Lasix yesterday, continue Lasix 80 mg IV every 8 hours. Echocardiogram is pending  Hypertension- Blood pressure still elevated, will start metoprolol 25 mg by mouth twice a day, continue when necessary hydralazine.  Diabetes mellitus- continue Lantus, sliding-scale insulin. Metformin on hold.  Left lower extremity DVT- patient found to have left lower ext DVT in the peroneal vein. Discussed with Dr Arlean Hopping, patient to undergo  Kidney biopsy in am, will not be able to anticoagulate. Patient is refusing IVC filter. Called and discussed with vascular surgeon on call Dr Darrick Penna, who recommends to hold the anticoagulation and repeat lower ext doppler after kidney biospy. He will put a note in epic for the recommnedations.   Code Status: Full code Family Communication: *No family at bedside Disposition Plan: Home in medically stable   Consultants:  Nephrology  Procedures:  None  Antibiotics:  None  HPI/Subjective: 49 year old male with a history of hypertension, diabetes mellitus came with worsening shortness of breath and swelling of both lower extremities. Patient found to be in acute kidney injury.  This morning patient still feels short of breath.  Objective: Filed Vitals:   01/01/15 0828  BP: 156/113  Pulse: 115  Temp: 98.3 F (36.8 C)  Resp: 19    Intake/Output Summary (Last 24 hours) at 01/01/15 1332 Last data filed at 01/01/15 0829  Gross per 24 hour  Intake    480 ml  Output   2200 ml   Net  -1720 ml   Filed Weights   12/31/14 0110 12/31/14 0300 01/01/15 0420  Weight: 136 kg (299 lb 13.2 oz) 139.935 kg (308 lb 8 oz) 140.621 kg (310 lb 0.2 oz)    Exam:   General:  Appears in no acute distress  Cardiovascular: S1-S2 regular  Respiratory: Clear to auscultation bilaterally  Abdomen: Soft, nontender, no organomegaly  Musculoskeletal: No edema of the lower extremities   Data Reviewed: Basic Metabolic Panel:  Recent Labs Lab 12/30/14 2123 12/31/14 0620 01/01/15 0940  NA 134* 133* 134*  K 4.7 4.4 4.3  CL 109 108 104  CO2 17* 20* 21*  GLUCOSE 241* 248* 267*  BUN 38* 35* 38*  CREATININE 4.37* 4.42* 4.32*  CALCIUM 8.3* 7.9* 8.4*   Liver Function Tests:  Recent Labs Lab 12/30/14 2123  AST 40  ALT 30  ALKPHOS 111  BILITOT 0.8  PROT 6.4*  ALBUMIN 1.9*    Recent Labs Lab 12/30/14 2123  LIPASE 61*   No results for input(s): AMMONIA in the last 168 hours. CBC:  Recent Labs Lab 12/30/14 2123 12/31/14 0620  WBC 8.4 7.4  HGB 13.2 11.6*  HCT 38.2* 34.4*  MCV 75.6* 75.3*  PLT 267 241   Cardiac Enzymes:  Recent Labs Lab 12/31/14 1854  CKTOTAL 127   BNP (last 3 results)  Recent Labs  12/30/14 2123  BNP 371.8*    ProBNP (last 3 results) No results for input(s): PROBNP in the last 8760 hours.  CBG:  Recent Labs Lab 12/31/14 2038 01/01/15 0016 01/01/15 0419 01/01/15 0828 01/01/15 1138  GLUCAP 216*  190* 135* 239* 194*    Recent Results (from the past 240 hour(s))  Urine culture     Status: None   Collection Time: 12/31/14  9:52 AM  Result Value Ref Range Status   Specimen Description URINE, CLEAN CATCH  Final   Special Requests Normal  Final   Culture 6,000 COLONIES/mL INSIGNIFICANT GROWTH  Final   Report Status 01/01/2015 FINAL  Final     Studies: Ct Abdomen Pelvis Wo Contrast  12/31/2014   CLINICAL DATA:  Subacute onset of left upper quadrant and left lower quadrant abdominal pain. Nausea, diarrhea and vomiting.  Initial encounter.  EXAM: CT ABDOMEN AND PELVIS WITHOUT CONTRAST  TECHNIQUE: Multidetector CT imaging of the abdomen and pelvis was performed following the standard protocol without IV contrast.  COMPARISON:  Lumbar spine radiographs performed 08/30/2013  FINDINGS: Mild bibasilar atelectasis is noted.  The liver and spleen are unremarkable in appearance. The gallbladder is within normal limits. The pancreas and adrenal glands are unremarkable.  Bilateral perinephric stranding and fluid are seen, with stranding tracking along the courses of the ureters bilaterally. Retroperitoneal stranding is also seen. This raises concern for bilateral pyelonephritis. There is no evidence of hydronephrosis. No obstructing ureteral stones are seen.  No free fluid is identified. The small bowel is unremarkable in appearance. The stomach is within normal limits. No acute vascular abnormalities are seen.  The appendix is normal in caliber and contains air, without evidence of appendicitis. Contrast progresses to the level of the splenic flexure of the colon. The colon is partially filled with stool and is unremarkable in appearance.  The bladder is moderately distended and grossly unremarkable. The prostate remains normal in size. No inguinal lymphadenopathy is seen.  No acute osseous abnormalities are identified. Facet disease is noted at the lower lumbar spine.  IMPRESSION: 1. Bilateral perinephric stranding and fluid, with stranding tracking along the courses of the ureters, and retroperitoneal stranding. This is concerning for bilateral pyelonephritis. Would correlate for associated symptoms and lab findings. No evidence of hydronephrosis. 2. Mild bibasilar atelectasis noted.   Electronically Signed   By: Roanna Raider M.D.   On: 12/31/2014 04:34   Dg Chest 2 View  12/30/2014   CLINICAL DATA:  Shortness breath for 1 month.  Diabetes.  EXAM: CHEST - 2 VIEW  COMPARISON:  Two-view chest x-ray 03/24/2012  FINDINGS: The heart is  enlarged. Mild pulmonary vascular congestion is present. No focal airspace disease is present. There are no effusions.  IMPRESSION: Cardiomegaly and mild pulmonary vascular congestion.   Electronically Signed   By: Marin Roberts M.D.   On: 12/30/2014 21:46   US Renal  12/31/2014   CLINICAL DATA:  Nephrotic syndrome.  EXAM: RENAL / URINARY TRACT ULTRASOUND COMPLETE  COMPARISON:  CT abdomen 12/31/2014  FINDINGS: Right Kidney:  Length: 12.2 cm. Echogenicity within normal limits. No mass or hydronephrosis visualized.  Left Kidney:  Length: 10.5 cm. Echogenicity within normal limits. No mass or hydronephrosis visualized.  Bladder:  Appears normal for degree of bladder distention.  IMPRESSION: Normal renal ultrasound.   Electronically Signed   By: Elige Ko   On: 12/31/2014 07:45    Scheduled Meds: . aspirin EC  81 mg Oral Daily  . enoxaparin (LOVENOX) injection  40 mg Subcutaneous Q24H  . furosemide  80 mg Intravenous 3 times per day  . insulin aspart  0-9 Units Subcutaneous 6 times per day  . insulin detemir  70 Units Subcutaneous QHS  . pneumococcal 23 valent vaccine  0.5 mL Intramuscular Tomorrow-1000  . sodium chloride  3 mL Intravenous Q12H   Continuous Infusions:   Active Problems:   Acute renal failure syndrome   Nephrotic syndrome   Hypoalbuminemia   Peripheral edema   Diabetes mellitus   Hypertension   AKI (acute kidney injury)   AP (abdominal pain)    Time spent: 20 min    [facility]  [facility] Pager [phone]*. If 7PM-7AM, please contact night-coverage at [email or web address], password [facility] 01/01/2015, 1:32 PM  LOS: 1 day

## 2015-01-01 NOTE — Progress Notes (Deleted)
ANTICOAGULATION CONSULT NOTE - Initial Consult  Pharmacy Consult for heparin Indication: DVT  No Known Allergies  Patient Measurements: Weight: (!) 310 lb 0.2 oz (140.621 kg) Heparin Dosing Weight: 108 kg  Vital Signs: Temp: 98.3 F (36.8 C) (08/14 0828) Temp Source: Oral (08/14 0828) BP: 156/113 mmHg (08/14 0828) Pulse Rate: 115 (08/14 0828)  Labs:  Recent Labs  12/30/14 2123 12/31/14 0620 12/31/14 1854 01/01/15 0940  HGB 13.2 11.6*  --   --   HCT 38.2* 34.4*  --   --   PLT 267 241  --   --   CREATININE 4.37* 4.42*  --  4.32*  CKTOTAL  --   --  127  --     Estimated Creatinine Clearance: 30.3 mL/min (by C-G formula based on Cr of 4.32).   Medical History: Past Medical History  Diagnosis Date  . Diabetes mellitus   . Hypertension   . High cholesterol     Medications:  Scheduled:  . aspirin EC  81 mg Oral Daily  . enoxaparin (LOVENOX) injection  40 mg Subcutaneous Q24H  . furosemide  80 mg Intravenous 3 times per day  . insulin aspart  0-9 Units Subcutaneous 6 times per day  . insulin detemir  70 Units Subcutaneous QHS  . pneumococcal 23 valent vaccine  0.5 mL Intramuscular Tomorrow-1000  . sodium chloride  3 mL Intravenous Q12H   Infusions:    Assessment: 49 yo male with new DVT of peroneal vein will be started on heparin.  Patient was on lovenox 40 mg sq q24h (last dose was @ 2210 on 08/13)  Goal of Therapy:  Heparin level 0.3-0.7 units/ml Monitor platelets by anticoagulation protocol: Yes   Plan:  - d/c lovenox - start heparin at 1700 units/hr. No bolus - 8 hr heparin level - daily heparin level and CBC  Akim Watkinson, Tsz-Yin 01/01/2015,2:14 PM

## 2015-01-01 NOTE — Progress Notes (Addendum)
[facility] Progress Note   Subjective: still orthopnea, no cough or DOE  Filed Vitals:   12/31/14 1715 12/31/14 2033 01/01/15 0420 01/01/15 0828  BP: 148/94 142/82 155/103 156/113  Pulse: 109 113 115 115  Temp: 98.7 F (37.1 C) 98.8 F (37.1 C) 98.4 F (36.9 C) 98.3 F (36.8 C)  TempSrc: Oral Oral Oral Oral  Resp: Weight:   140.621 kg (310 lb 0.2 oz)   SpO2: 95% 97% 92% 99%   Exam: Alert, no distress, can't lie flat for long has to sit up to breathe No rash, cyanosis or gangrene Sclera anicteric, throat clear No jvd or LAN Chest occ rales at bases RRR no MRG, non displaced Abd very obese, no gross ascites, no HSM, +BS and nontender No CVAT GU normal male Ext bilat pretib edema and pedal edema 1-2+ Neuro is alert, nf ox 3   UA in 2009 > neg protein, 1.036, no rbc/ wbc UA on 12/17/14 > 3-6 wbc, 0-2 rbc, >300 prot, large Hb, 1.033 UA on 12/30/14 > 0-2 wbc, 7-10 rbc, > 300 prot, 1.016, mod Hb  Renal US today > 10.5/ 12.3 cm with no hydro CT abd today > bilat perinephric stranding and fluid seen, with stranding tracking along the courses of the ureters bilaterally also. Raises concern for bilat pyelonephritis. No hydro or stones. Bladder distended, prostate normal in size. CXR 8/12- +vasc congestion and CM CXR 8/14 - resolved edema/ congestion, no acute disease  Urine sediment (8/14) > several granular casts, 3-5 rbcs/hpf, 2-4 wbc/hpf, a few casts with rbc and wbc elements  Assessment: 1. Acute on CKD 3 - active sediment w rbc/wbc casts and heavy proteinuria (13 gm by UPC ratio). 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 Will order serologies and ask IR to see for renal biopsy. Starting IV steroids today. Have d/w and explained risks of biopsy, possible need for HD this admission depending on renal function, acute side effects of steroids. He agrees to proceed.  2. DM x 20  years 3. HTN - new onset, cont diuresis and have started schedule hydralazine and MTP today, need to get BP down before renal biopsy 4. Vol excess / mild pulm edema - resolved by CXR today 5. Obesity  Plan - as above, have d/w primary MD    Charles Moselle MD  pager [phone]    cell [phone]  01/01/2015, 2:54 PM     Recent Labs Lab 12/30/14 2123 12/31/14 0620 01/01/15 0940  NA 134* 133* 134*  K 4.7 4.4 4.3  CL 109 108 104  CO2 17* 20* 21*  GLUCOSE 241* 248* 267*  BUN 38* 35* 38*  CREATININE 4.37* 4.42* 4.32*  CALCIUM 8.3* 7.9* 8.4*    Recent Labs Lab 12/30/14 2123  AST 40  ALT 30  ALKPHOS 111  BILITOT 0.8  PROT 6.4*  ALBUMIN 1.9*    Recent Labs Lab 12/30/14 2123 12/31/14 0620  WBC 8.4 7.4  HGB 13.2 11.6*  HCT 38.2* 34.4*  MCV 75.6* 75.3*  PLT 267 241   . aspirin EC  81 mg Oral Daily  . furosemide  80 mg Intravenous 3 times per day  . insulin aspart  0-9 Units Subcutaneous 6 times per day  . insulin detemir  70 Units Subcutaneous QHS  . pneumococcal 23 valent vaccine  0.5 mL Intramuscular Tomorrow-1000  . sodium chloride  3 mL Intravenous Q12H  acetaminophen **OR** acetaminophen, alum & mag hydroxide-simeth, hydrALAZINE, HYDROmorphone (DILAUDID) injection, iohexol, iohexol, oxyCODONE

## 2015-01-01 NOTE — Progress Notes (Addendum)
Pt complaining of sharp pain in the left upper abdominal quadrant. Gave Oxy IR, did not help, MD at bedside.  Called Radiology to expedite xray process. MD ordered to give 1 mg dilaudid and VQ scan. Vitals stable. Will continue to monitor

## 2015-01-01 NOTE — Consult Note (Signed)
Pt noted to have peroneal vein DVT on duplex.  He is scheduled for renal biopsy.  Overall risk of embolus is low with isolated peroneal vein DVT.  Would proceed with renal biopsy off anticoagulation.  Would place on full anticoagulation as soon as possible after biopsy for 3 months.  If worsening leg symptoms or unable to start anticoagulation in less than 7 days would repeat duplex in 1 week.  If propagation of clot into popliteal vein at that time he would need an IVC filter if he cannot be anticoagulated.  Fabienne Bruns, MD [facility] Office: [phone] Pager: [phone]

## 2015-01-01 NOTE — Progress Notes (Signed)
Bilateral lower extremity venous duplex completed.  Right:  No evidence of DVT, superficial thrombosis, or Baker's cyst.  Left: DVT noted peroneal vein.  No evidence of superficial thrombosis.  No Baker's cyst.

## 2015-01-01 NOTE — Progress Notes (Signed)
  Echocardiogram 2D Echocardiogram has been performed.  Delcie Roch 01/01/2015, 4:39 PM

## 2015-01-02 ENCOUNTER — Encounter (HOSPITAL_COMMUNITY): Payer: Self-pay | Admitting: Radiology

## 2015-01-02 ENCOUNTER — Inpatient Hospital Stay (HOSPITAL_COMMUNITY): Payer: BLUE CROSS/BLUE SHIELD

## 2015-01-02 DIAGNOSIS — R609 Edema, unspecified: Secondary | ICD-10-CM | POA: Insufficient documentation

## 2015-01-02 DIAGNOSIS — I5021 Acute systolic (congestive) heart failure: Secondary | ICD-10-CM

## 2015-01-02 DIAGNOSIS — R809 Proteinuria, unspecified: Secondary | ICD-10-CM | POA: Insufficient documentation

## 2015-01-02 DIAGNOSIS — N179 Acute kidney failure, unspecified: Principal | ICD-10-CM

## 2015-01-02 DIAGNOSIS — R0602 Shortness of breath: Secondary | ICD-10-CM

## 2015-01-02 DIAGNOSIS — N049 Nephrotic syndrome with unspecified morphologic changes: Secondary | ICD-10-CM

## 2015-01-02 LAB — BASIC METABOLIC PANEL
ANION GAP: 11 (ref 5–15)
BUN: 45 mg/dL — ABNORMAL HIGH (ref 6–20)
CALCIUM: 9 mg/dL (ref 8.9–10.3)
CHLORIDE: 98 mmol/L — AB (ref 101–111)
CO2: 22 mmol/L (ref 22–32)
Creatinine, Ser: 4.46 mg/dL — ABNORMAL HIGH (ref 0.61–1.24)
GFR calc Af Amer: 17 mL/min — ABNORMAL LOW (ref >60)
GFR calc non Af Amer: 14 mL/min — ABNORMAL LOW (ref >60)
GLUCOSE: 499 mg/dL — AB (ref 65–99)
POTASSIUM: 5 mmol/L (ref 3.5–5.1)
Sodium: 131 mmol/L — ABNORMAL LOW (ref 135–145)

## 2015-01-02 LAB — HEMOGLOBIN A1C
Hgb A1c MFr Bld: 12.5 % — ABNORMAL HIGH (ref 4.8–5.6)
Mean Plasma Glucose: 312 mg/dL

## 2015-01-02 LAB — GLUCOSE, CAPILLARY
GLUCOSE-CAPILLARY: 435 mg/dL — AB (ref 65–99)
GLUCOSE-CAPILLARY: 204 mg/dL — AB (ref 65–99)
Glucose-Capillary: 192 mg/dL — ABNORMAL HIGH (ref 65–99)
Glucose-Capillary: 419 mg/dL — ABNORMAL HIGH (ref 65–99)
Glucose-Capillary: 520 mg/dL — ABNORMAL HIGH (ref 65–99)

## 2015-01-02 LAB — PROTIME-INR
INR: 1.26 (ref 0.00–1.49)
PROTHROMBIN TIME: 16 s — AB (ref 11.6–15.2)

## 2015-01-02 LAB — C4 COMPLEMENT: Complement C4, Body Fluid: 15 mg/dL (ref 14–44)

## 2015-01-02 LAB — C3 COMPLEMENT: C3 Complement: 150 mg/dL (ref 82–167)

## 2015-01-02 LAB — HIV ANTIBODY (ROUTINE TESTING W REFLEX): HIV Screen 4th Generation wRfx: NONREACTIVE

## 2015-01-02 MED ORDER — FENTANYL CITRATE (PF) 100 MCG/2ML IJ SOLN
INTRAMUSCULAR | Status: AC
  Filled 2015-01-02: qty 4

## 2015-01-02 MED ORDER — FENTANYL CITRATE (PF) 100 MCG/2ML IJ SOLN
INTRAMUSCULAR | Status: AC | PRN
  Administered 2015-01-02: 50 ug via INTRAVENOUS

## 2015-01-02 MED ORDER — GELATIN ABSORBABLE 12-7 MM EX MISC
CUTANEOUS | Status: AC
  Filled 2015-01-02: qty 1

## 2015-01-02 MED ORDER — INSULIN ASPART 100 UNIT/ML ~~LOC~~ SOLN: 0.0000 [IU] | Freq: Every day | SUBCUTANEOUS | Status: DC

## 2015-01-02 MED ORDER — FUROSEMIDE 80 MG PO TABS
160.0000 mg | ORAL_TABLET | Freq: Two times a day (BID) | ORAL | Status: DC
  Administered 2015-01-02 – 2015-01-04 (×4): 160 mg via ORAL
  Filled 2015-01-02: qty 4
  Filled 2015-01-02 (×3): qty 2
  Filled 2015-01-02: qty 4

## 2015-01-02 MED ORDER — MIDAZOLAM HCL 2 MG/2ML IJ SOLN
INTRAMUSCULAR | Status: AC | PRN
  Administered 2015-01-02: 1 mg via INTRAVENOUS

## 2015-01-02 MED ORDER — INSULIN ASPART 100 UNIT/ML ~~LOC~~ SOLN
0.0000 [IU] | Freq: Three times a day (TID) | SUBCUTANEOUS | Status: DC
  Administered 2015-01-02: 5 [IU] via SUBCUTANEOUS
  Administered 2015-01-02 (×2): 15 [IU] via SUBCUTANEOUS
  Administered 2015-01-03 (×2): 5 [IU] via SUBCUTANEOUS

## 2015-01-02 MED ORDER — INSULIN DETEMIR 100 UNIT/ML ~~LOC~~ SOLN
35.0000 [IU] | Freq: Two times a day (BID) | SUBCUTANEOUS | Status: DC
  Administered 2015-01-02 – 2015-01-08 (×12): 35 [IU] via SUBCUTANEOUS
  Filled 2015-01-02 (×15): qty 0.35

## 2015-01-02 MED ORDER — CARVEDILOL 6.25 MG PO TABS
6.2500 mg | ORAL_TABLET | Freq: Two times a day (BID) | ORAL | Status: DC
  Administered 2015-01-02 – 2015-01-03 (×2): 6.25 mg via ORAL
  Filled 2015-01-02 (×2): qty 1

## 2015-01-02 MED ORDER — CLONIDINE HCL 0.2 MG PO TABS
0.2000 mg | ORAL_TABLET | Freq: Once | ORAL | Status: AC
  Administered 2015-01-02: 0.2 mg via ORAL
  Filled 2015-01-02: qty 1

## 2015-01-02 MED ORDER — MIDAZOLAM HCL 2 MG/2ML IJ SOLN
INTRAMUSCULAR | Status: AC
  Filled 2015-01-02: qty 6

## 2015-01-02 MED ORDER — ISOSORBIDE MONONITRATE ER 30 MG PO TB24
30.0000 mg | ORAL_TABLET | Freq: Every day | ORAL | Status: DC
  Administered 2015-01-02 – 2015-01-08 (×7): 30 mg via ORAL
  Filled 2015-01-02 (×7): qty 1

## 2015-01-02 MED ORDER — CLONIDINE HCL ER 0.1 MG PO TB12
0.2000 mg | ORAL_TABLET | Freq: Once | ORAL | Status: DC
  Filled 2015-01-02: qty 2

## 2015-01-02 MED ORDER — LIDOCAINE HCL 1 % IJ SOLN
INTRAMUSCULAR | Status: AC
  Filled 2015-01-02: qty 20

## 2015-01-02 NOTE — Progress Notes (Signed)
Chief Complaint: Patient was seen in consultation today for renal biopsy at the request of Dr. Arlean Hopping  Referring Physician(s): Dr. Arlean Hopping  History of Present Illness: Charles Montgomery is a 49 y.o. male with acte renal failure and proteinuria. Nephrology team has requested random renal biopsy as part of workup. Chart, PMHx, meds, labs, imaging reviewed. Pt has LE calf DVT and has been on Lovenox but this has been stopped in anticipation of biopsy. Last dose was 8/13  Past Medical History  Diagnosis Date  . Diabetes mellitus   . Hypertension   . High cholesterol     History reviewed. No pertinent past surgical history.  Allergies: Review of patient's allergies indicates no known allergies.  Medications:  Current facility-administered medications:  .  acetaminophen (TYLENOL) tablet 650 mg, 650 mg, Oral, Q6H PRN **OR** acetaminophen (TYLENOL) suppository 650 mg, 650 mg, Rectal, Q6H PRN, Ron Parker, MD .  alum & mag hydroxide-simeth (MAALOX/MYLANTA) 200-200-20 MG/5ML suspension 30 mL, 30 mL, Oral, Q6H PRN, Ron Parker, MD, 30 mL at 01/01/15 1245 .  cloNIDine (CATAPRES) tablet 0.1 mg, 0.1 mg, Oral, TID PRN, Delano Metz, MD .  furosemide (LASIX) injection 80 mg, 80 mg, Intravenous, 3 times per day, Delano Metz, MD, 80 mg at 01/02/15 0617 .  hydrALAZINE (APRESOLINE) tablet 25 mg, 25 mg, Oral, 3 times per day, Delano Metz, MD, 25 mg at 01/02/15 0617 .  HYDROmorphone (DILAUDID) injection 0.5-1 mg, 0.5-1 mg, Intravenous, Q3H PRN, Ron Parker, MD, 1 mg at 01/01/15 1704 .  insulin aspart (novoLOG) injection 0-15 Units, 0-15 Units, Subcutaneous, TID WC, Meredeth Ide, MD, 15 Units at 01/02/15 [phone] .  insulin aspart (novoLOG) injection 0-5 Units, 0-5 Units, Subcutaneous, QHS, Meredeth Ide, MD .  insulin detemir (LEVEMIR) injection 35 Units, 35 Units, Subcutaneous, BID, Meredeth Ide, MD, 35 Units at 01/02/15 [phone] .  iohexol (OMNIPAQUE) 300 MG/ML solution 25  mL, 25 mL, Oral, Once PRN, Medication Radiologist, MD .  iohexol (OMNIPAQUE) 300 MG/ML solution 25 mL, 25 mL, Oral, Once PRN, Medication Radiologist, MD .  methylPREDNISolone sodium succinate (SOLU-MEDROL) 500 mg in sodium chloride 0.9 % 50 mL IVPB, 500 mg, Intravenous, Q24H, Delano Metz, MD, 500 mg at 01/01/15 1645 .  metoprolol tartrate (LOPRESSOR) tablet 25 mg, 25 mg, Oral, BID, Delano Metz, MD, 25 mg at 01/02/15 0839 .  oxyCODONE (Oxy IR/ROXICODONE) immediate release tablet 5 mg, 5 mg, Oral, Q4H PRN, Ron Parker, MD, 5 mg at 01/01/15 1602 .  pneumococcal 23 valent vaccine (PNU-IMMUNE) injection 0.5 mL, 0.5 mL, Intramuscular, Tomorrow-1000, Harvette C Jenkins, MD .  sodium chloride 0.9 % injection 3 mL, 3 mL, Intravenous, Q12H, Ron Parker, MD, 3 mL at 01/02/15 0840 .  technetium TC 75M diethylenetriame-pentaacetic acid (DTPA) injection 43.4 milli Curie, 43.4 milli Curie, Armed forces training and education officer, Once PRN, Medication Radiologist, MD    History reviewed. No pertinent family history.  Social History   Social History  . Marital Status: Married    Spouse Name: N/A  . Number of Children: N/A  . Years of Education: N/A   Social History Main Topics  . Smoking status: Current Every Day Smoker -- 0.25 packs/day for 30 years    Types: Cigarettes  . Smokeless tobacco: None  . Alcohol Use: Yes     Comment: rarely  . Drug Use: No  . Sexual Activity: Not Asked   Other Topics Concern  . None   Social History Narrative    Review of Systems:  A 12 point ROS discussed and pertinent positives are indicated in the HPI above.  All other systems are negative.  Review of Systems  Vital Signs: BP 133/87 mmHg  Pulse 112  Temp(Src) 98.9 F (37.2 C) (Oral)  Resp 16  Wt 299 lb 4 oz (135.739 kg)  SpO2 93%  Physical Exam  Constitutional: He is oriented to person, place, and time. He appears well-developed and well-nourished. No distress.  HENT:  Head: Normocephalic.  Mouth/Throat:  Oropharynx is clear and moist.  Neck: Normal range of motion. No JVD present. No tracheal deviation present.  Cardiovascular: Normal rate, regular rhythm and normal heart sounds.   Pulmonary/Chest: Effort normal and breath sounds normal. No respiratory distress.  Abdominal: Soft. He exhibits no distension. There is no tenderness.  Neurological: He is alert and oriented to person, place, and time.  Psychiatric: He has a normal mood and affect. Judgment normal.    Mallampati Score:  MD Evaluation Airway: WNL Heart: WNL Abdomen: WNL Chest/ Lungs: WNL ASA  Classification: 2 Mallampati/Airway Score: Two  Imaging: Ct Abdomen Pelvis Wo Contrast  12/31/2014   CLINICAL DATA:  Subacute onset of left upper quadrant and left lower quadrant abdominal pain. Nausea, diarrhea and vomiting. Initial encounter.  EXAM: CT ABDOMEN AND PELVIS WITHOUT CONTRAST  TECHNIQUE: Multidetector CT imaging of the abdomen and pelvis was performed following the standard protocol without IV contrast.  COMPARISON:  Lumbar spine radiographs performed 08/30/2013  FINDINGS: Mild bibasilar atelectasis is noted.  The liver and spleen are unremarkable in appearance. The gallbladder is within normal limits. The pancreas and adrenal glands are unremarkable.  Bilateral perinephric stranding and fluid are seen, with stranding tracking along the courses of the ureters bilaterally. Retroperitoneal stranding is also seen. This raises concern for bilateral pyelonephritis. There is no evidence of hydronephrosis. No obstructing ureteral stones are seen.  No free fluid is identified. The small bowel is unremarkable in appearance. The stomach is within normal limits. No acute vascular abnormalities are seen.  The appendix is normal in caliber and contains air, without evidence of appendicitis. Contrast progresses to the level of the splenic flexure of the colon. The colon is partially filled with stool and is unremarkable in appearance.  The bladder  is moderately distended and grossly unremarkable. The prostate remains normal in size. No inguinal lymphadenopathy is seen.  No acute osseous abnormalities are identified. Facet disease is noted at the lower lumbar spine.  IMPRESSION: 1. Bilateral perinephric stranding and fluid, with stranding tracking along the courses of the ureters, and retroperitoneal stranding. This is concerning for bilateral pyelonephritis. Would correlate for associated symptoms and lab findings. No evidence of hydronephrosis. 2. Mild bibasilar atelectasis noted.   Electronically Signed   By: Roanna Raider M.D.   On: 12/31/2014 04:34   Dg Chest 2 View  01/01/2015   CLINICAL DATA:  Left upper quadrant abdominal pain, DVT  EXAM: CHEST  2 VIEW  COMPARISON:  12/30/2014  FINDINGS: Lungs are clear.  No pleural effusion or pneumothorax.  Mild cardiomegaly.  Visualized osseous structures are within normal limits.  IMPRESSION: No evidence of acute cardiopulmonary disease.   Electronically Signed   By: Charline Bills M.D.   On: 01/01/2015 19:01   Dg Chest 2 View  12/30/2014   CLINICAL DATA:  Shortness breath for 1 month.  Diabetes.  EXAM: CHEST - 2 VIEW  COMPARISON:  Two-view chest x-ray 03/24/2012  FINDINGS: The heart is enlarged. Mild pulmonary vascular congestion is present. No focal airspace disease  is present. There are no effusions.  IMPRESSION: Cardiomegaly and mild pulmonary vascular congestion.   Electronically Signed   By: Marin Roberts M.D.   On: 12/30/2014 21:46   US Renal  12/31/2014   CLINICAL DATA:  Nephrotic syndrome.  EXAM: RENAL / URINARY TRACT ULTRASOUND COMPLETE  COMPARISON:  CT abdomen 12/31/2014  FINDINGS: Right Kidney:  Length: 12.2 cm. Echogenicity within normal limits. No mass or hydronephrosis visualized.  Left Kidney:  Length: 10.5 cm. Echogenicity within normal limits. No mass or hydronephrosis visualized.  Bladder:  Appears normal for degree of bladder distention.  IMPRESSION: Normal renal ultrasound.    Electronically Signed   By: Elige Ko   On: 12/31/2014 07:45   Nm Pulmonary Perf And Vent  01/02/2015   CLINICAL DATA:  Shortness of breath  EXAM: NUCLEAR MEDICINE VENTILATION - PERFUSION LUNG SCAN  TECHNIQUE: Ventilation images were obtained in multiple projections using inhaled aerosol Tc-60m DTPA. Perfusion images were obtained in multiple projections after intravenous injection of Tc-14m MAA.  RADIOPHARMACEUTICALS:  43.4 Technetium-19m DTPA aerosol inhalation and 62.7 Technetium-16m MAA IV  COMPARISON:  Chest x-ray from the same day  FINDINGS: Ventilation: No focal ventilation defect.  Perfusion: No defects.  IMPRESSION: Normal lung perfusion.   Electronically Signed   By: Marnee Spring M.D.   On: 01/02/2015 03:42    Labs:  CBC:  Recent Labs  08/03/14 1933 08/03/14 1945 12/17/14 1423 12/30/14 2123 12/31/14 0620  WBC 9.0  --  7.0 8.4 7.4  HGB 16.2 18.4* 14.5 13.2 11.6*  HCT 47.5 54.0* 42.5 38.2* 34.4*  PLT 210  --  223 267 241    COAGS: No results for input(s): INR, APTT in the last 8760 hours.  BMP:  Recent Labs  12/30/14 2123 12/31/14 0620 01/01/15 0940 01/02/15 0406  NA 134* 133* 134* 131*  K 4.7 4.4 4.3 5.0  CL 109 108 104 98*  CO2 17* 20* 21* 22  GLUCOSE 241* 248* 267* 499*  BUN 38* 35* 38* 45*  CALCIUM 8.3* 7.9* 8.4* 9.0  CREATININE 4.37* 4.42* 4.32* 4.46*  GFRNONAA 15* 15* 15* 14*  GFRAA 17* 17* 17* 17*    LIVER FUNCTION TESTS:  Recent Labs  12/30/14 2123  BILITOT 0.8  AST 40  ALT 30  ALKPHOS 111  PROT 6.4*  ALBUMIN 1.9*    Assessment and Plan: AKI and proteinuria Plan for Image guided random renal biopsy today. Labs reviewed, ok Risks and Benefits discussed with the patient including, but not limited to bleeding, infection, damage to adjacent structures or low yield requiring additional tests. All of the patient's questions were answered, patient is agreeable to proceed. Consent signed and in chart.  SignedBrayton El 01/02/2015, 9:19 AM   I spent a total of 20 Minutes in face to face in clinical consultation, greater than 50% of which was counseling/coordinating care for renal biopsy

## 2015-01-02 NOTE — Consult Note (Signed)
Admit date: 12/31/2014 Referring Physician  Dr. Lowell Guitar Primary Physician Georgann Housekeeper, MD Primary Cardiologist  : none Reason for Consultation  CHF  HPI: 49 year old male with acute kidney injury, baseline CK D stage III, with history of diabetes, essential hypertension with mild pulmonary edema/volume excess, obesity being seen for the evaluation of newly diagnosed heart failure.  Echocardiogram from 01/01/15 demonstrated an ejection fraction of 20-25%. This is newly discovered. Mild cardiomegaly was noted on chest x-ray. Creatinine is 4.46. Glucose is 499.  His a lower extremity calf DVT and has been on Lovenox but this has been stopped in anticipation of renal biopsy to take placed by interventional radiology.  He presented on 12/31/14 with chief complaint of shortness of breath and lower extremity edema. This is been progressive over the last month. Proteinuria was noted, nephrotic syndrome was diagnosed. IV diuresis took place and subsequent chest x-ray showed clearance of pulmonary edema.  He is noted significant increase in work of breathing over the past month. He admits to drinking approximately a case of beer per day (he had to cut back or stop in the last few days because of increasing weight gain). He also admits to using cocaine in the past. He also smokes.  PMH:   Past Medical History  Diagnosis Date  . Diabetes mellitus   . Hypertension   . High cholesterol     PSH:  History reviewed. No pertinent past surgical history. Allergies:  Review of patient's allergies indicates no known allergies. Prior to Admit Meds:   Prior to Admission medications   Medication Sig Start Date End Date Taking? Authorizing Provider  aspirin EC 81 MG tablet Take 81 mg by mouth daily.   Yes Historical Provider, MD  insulin detemir (LEVEMIR) 100 UNIT/ML injection Inject 70 Units into the skin at bedtime.   Yes Historical Provider, MD  metFORMIN (GLUCOPHAGE) 1000 MG tablet Take 1 tablet (1,000  mg total) by mouth 2 (two) times daily. 02/12/12  Yes Renne Crigler, PA-C  doxycycline (VIBRAMYCIN) 50 MG capsule Take 2 capsules (100 mg total) by mouth 2 (two) times daily. For 7 days Patient not taking: Reported on 12/31/2014 12/17/14   Gwynn Burly, DO  hydrochlorothiazide (HYDRODIURIL) 25 MG tablet Take 1 tablet (25 mg total) by mouth daily. Patient not taking: Reported on 08/03/2014 03/27/12   Ivonne Andrew, PA-C  insulin aspart (NOVOLOG) 100 UNIT/ML injection Inject 23 Units into the skin 2 (two) times daily at 10 AM and 5 PM. Patient not taking: Reported on 08/03/2014 03/27/12   Ivonne Andrew, PA-C   Fam HX:   Family history, father and stage renal disease Social HX:    Social History   Social History  . Marital Status: Married    Spouse Name: N/A  . Number of Children: N/A  . Years of Education: N/A   Occupational History  . Not on file.   Social History Main Topics  . Smoking status: Current Every Day Smoker -- 0.25 packs/day for 30 years    Types: Cigarettes  . Smokeless tobacco: Not on file  . Alcohol Use: Yes     Comment: rarely  . Drug Use: No  . Sexual Activity: Not on file   Other Topics Concern  . Not on file   Social History Narrative     ROS:  Denies syncope, bleeding. Subjective fevers at home, diaphoresis. Occasional chest discomfort, none recently. All 11 ROS were addressed and are negative except what is stated in the HPI  Physical Exam: Blood pressure 128/116, pulse 105, temperature 98.2 F (36.8 C), temperature source Oral, resp. rate 18, weight 299 lb 4 oz (135.739 kg), SpO2 92 %.   General: Well developed, well nourished, in no acute distress Head: Eyes PERRLA, No xanthomas.   Normal cephalic and atramatic  Lungs:   Clear bilaterally to auscultation and percussion. Normal respiratory effort. No wheezes, no rales. Heart:   HRRR S1 S2 Pulses are 2+ & equal. No murmur, rubs, gallops.  No carotid bruit. Mid neck JVD.  No abdominal bruits.  Abdomen:  Bowel sounds are positive, abdomen soft and non-tender without masses. No hepatosplenomegaly. Overweight Msk:  Back normal. Normal strength and tone for age. Extremities:  No clubbing, cyanosis, 1+ bilateral lower extremity edema.  DP +1 Neuro: Alert and oriented X 3, non-focal, MAE x 4 GU: Deferred Rectal: Deferred Psych:  Good affect, responds appropriately      Labs: Lab Results  Component Value Date   WBC 7.4 12/31/2014   HGB 11.6* 12/31/2014   HCT 34.4* 12/31/2014   MCV 75.3* 12/31/2014   PLT 241 12/31/2014     Recent Labs Lab 12/30/14 2123  01/02/15 0406  NA 134*  < > 131*  K 4.7  < > 5.0  CL 109  < > 98*  CO2 17*  < > 22  BUN 38*  < > 45*  CREATININE 4.37*  < > 4.46*  CALCIUM 8.3*  < > 9.0  PROT 6.4*  --   --   BILITOT 0.8  --   --   ALKPHOS 111  --   --   ALT 30  --   --   AST 40  --   --   GLUCOSE 241*  < > 499*  < > = values in this interval not displayed.  Recent Labs  12/31/14 1854  CKTOTAL 127   No results found for: CHOL, HDL, LDLCALC, TRIG No results found for: DDIMER   Radiology:  Dg Chest 2 View  01/01/2015   CLINICAL DATA:  Left upper quadrant abdominal pain, DVT  EXAM: CHEST  2 VIEW  COMPARISON:  12/30/2014  FINDINGS: Lungs are clear.  No pleural effusion or pneumothorax.  Mild cardiomegaly.  Visualized osseous structures are within normal limits.  IMPRESSION: No evidence of acute cardiopulmonary disease.   Electronically Signed   By: Charline Bills M.D.   On: 01/01/2015 19:01   Nm Pulmonary Perf And Vent  01/02/2015   CLINICAL DATA:  Shortness of breath  EXAM: NUCLEAR MEDICINE VENTILATION - PERFUSION LUNG SCAN  TECHNIQUE: Ventilation images were obtained in multiple projections using inhaled aerosol Tc-11m DTPA. Perfusion images were obtained in multiple projections after intravenous injection of Tc-87m MAA.  RADIOPHARMACEUTICALS:  43.4 Technetium-19m DTPA aerosol inhalation and 62.7 Technetium-35m MAA IV  COMPARISON:  Chest x-ray from the  same day  FINDINGS: Ventilation: No focal ventilation defect.  Perfusion: No defects.  IMPRESSION: Normal lung perfusion.   Electronically Signed   By: Marnee Spring M.D.   On: 01/02/2015 03:42   Personally viewed.  EKG:  12/30/14-sinus tachycardia rate 112 bpm with left anterior fascicular block, nonspecific ST-T wave changes Personally viewed.   Telemetry: No evidence of ventricular tachycardia  Echocardiogram: 01/01/15 - Left ventricle: The cavity size was dilated. There was mild concentric hypertrophy. Systolic function was severely reduced. The estimated ejection fraction was in the range of 20% to 25%. Severe diffuse hypokinesis. Although no diagnostic regional wall motion abnormality was identified, this possibility cannot  be completely excluded on the basis of this study. - Left atrium: The atrium was mildly to moderately dilated. - Pericardium, extracardiac: A trivial pericardial effusion was identified.  VQ scan: Normal lung perfusion  Scheduled Meds: . cloNIDine  0.2 mg Oral Once  . cloNIDine HCl  0.2 mg Oral Once  . furosemide  160 mg Oral BID  . hydrALAZINE  25 mg Oral 3 times per day  . insulin aspart  0-15 Units Subcutaneous TID WC  . insulin aspart  0-5 Units Subcutaneous QHS  . insulin detemir  35 Units Subcutaneous BID  . metoprolol tartrate  25 mg Oral BID  . pneumococcal 23 valent vaccine  0.5 mL Intramuscular Tomorrow-1000  . sodium chloride  3 mL Intravenous Q12H   Continuous Infusions:  PRN Meds:.acetaminophen **OR** acetaminophen, alum & mag hydroxide-simeth, cloNIDine, HYDROmorphone (DILAUDID) injection, iohexol, iohexol, oxyCODONE, technetium TC 35M diethylenetriame-pentaacetic acid   ASSESSMENT/PLAN:    49 year old male with recently diagnosed nephrotic syndrome and acute systolic heart failure/newly diagnosed cardiomyopathy with ejection fraction of 20-25% with underlying hypertension, family history of end-stage renal disease. Heavy  alcohol use, prior cocaine use, smoker.  1. Acute systolic heart failure/cardiomyopathy  - Given his underlying hypertension, certainly there is a possibility for nonischemic cardiomyopathy/hypertensive cardiomyopathy.  - He also drinks an excessive amount of alcohol, case of beer a day-may be alcoholic cardiomyopathy. - Cannot exclude however ischemic cardiomyopathy at this time (note has underlying diabetes which is a coronary artery disease equivalent and has used cocaine in the past which accelerate atherosclerosis). Creatinine of 4.46, would not perform cardiac catheterization at this time.  - Sinus tachycardia is likely compensatory for reduced cardiac output  - Most recent chest x-ray shows no evidence of pulmonary edema. Mild cardiomegaly noted.  - Continuing with high-dose furosemide, 160 mg twice a day as prescribed by nephrology.  - Breathing has improved, pulmonary edema has improved.  - I will change his metoprolol to carvedilol given his underlying cardiomyopathy and place him on 6.25 mg twice a day.  - He is currently on hydralazine 25 mg 3 times a day. Increase as tolerated.  - I will start Imdur 30 mg once a day.  - Avoiding ACE inhibitor because of acute kidney injury.  2. Acute renal failure  - Per nephrology, obtaining renal biopsy  3. DVT  - Lovenox on hold for renal biopsy  - Likely hypercoagulable because of nephrotic syndrome  4. Alcohol use  - Discussed alcohol cessation. Understands toxicity to heart.  5. Cocaine use  - He admits to using in the past. Understands risk of sudden death.  We will follow along with you.  Donato Schultz, MD  01/02/2015  11:21 AM

## 2015-01-02 NOTE — Progress Notes (Signed)
Assessment: 1. Acute on CKD 3 - active sediment w rbc/wbc casts and heavy proteinuria (13 gm by UPC ratio). Has hx of diabetes and recent fevers and took NSAIDs for fever PTA for several days.  Bx today to r/o post-infectious GN (just completed abx for small axilla abcess) or other acute/ rapidly progressive GN. Will order serologies and ask IR to see for renal biopsy. Will stop steroids given additional hx but proceed with biopsy. 2. DM x 20 years 3. HTN - new onset, cont diuresis and have started schedule hydralazine and MTP today, need to get BP down before renal biopsy 4. Vol excess / mild pulm edema - resolved by CXR today Obesity 6    FH of ESRD(father)  Subjective: Interval History: Took ibuprofen 2 daily for several days PTA  Objective: Vital signs in last 24 hours: Temp:  [97.4 F (36.3 C)-99.1 F (37.3 C)] 98.9 F (37.2 C) (08/15 0500) Pulse Rate:  [110-116] 112 (08/15 0500) Resp:  [16-18] 16 (08/15 0500) BP: (122-138)/(69-87) 133/87 mmHg (08/15 0500) SpO2:  [93 %-100 %] 93 % (08/15 0500) Weight:  [135.739 kg (299 lb 4 oz)] 135.739 kg (299 lb 4 oz) (08/15 0500) Weight change: -4.882 kg (-10 lb 12.2 oz)  Intake/Output from previous day: 08/14 0701 - 08/15 0700 In: 360 [P.O.:360] Out: 900 [Urine:900] Intake/Output this shift: Total I/O In: 360 [P.O.:360] Out: -   General appearance: alert and cooperative Resp: clear to auscultation bilaterally Chest wall: no tenderness Cardio: regular rate and rhythm, S1, S2 normal, no murmur, click, rub or gallop Extremities: edema 1+\  Lab Results:  Recent Labs  12/30/14 2123 12/31/14 0620  WBC 8.4 7.4  HGB 13.2 11.6*  HCT 38.2* 34.4*  PLT 267 241   BMET:  Recent Labs  01/01/15 0940 01/02/15 0406  NA 134* 131*  K 4.3 5.0  CL 104 98*  CO2 21* 22  GLUCOSE 267* 499*  BUN 38* 45*  CREATININE 4.32* 4.46*  CALCIUM 8.4* 9.0   No results for input(s): PTH in the last 72 hours. Iron Studies: No results for input(s):  IRON, TIBC, TRANSFERRIN, FERRITIN in the last 72 hours. Studies/Results: Dg Chest 2 View  01/01/2015   CLINICAL DATA:  Left upper quadrant abdominal pain, DVT  EXAM: CHEST  2 VIEW  COMPARISON:  12/30/2014  FINDINGS: Lungs are clear.  No pleural effusion or pneumothorax.  Mild cardiomegaly.  Visualized osseous structures are within normal limits.  IMPRESSION: No evidence of acute cardiopulmonary disease.   Electronically Signed   By: Charline Bills M.D.   On: 01/01/2015 19:01   Nm Pulmonary Perf And Vent  01/02/2015   CLINICAL DATA:  Shortness of breath  EXAM: NUCLEAR MEDICINE VENTILATION - PERFUSION LUNG SCAN  TECHNIQUE: Ventilation images were obtained in multiple projections using inhaled aerosol Tc-48m DTPA. Perfusion images were obtained in multiple projections after intravenous injection of Tc-50m MAA.  RADIOPHARMACEUTICALS:  43.4 Technetium-75m DTPA aerosol inhalation and 62.7 Technetium-27m MAA IV  COMPARISON:  Chest x-ray from the same day  FINDINGS: Ventilation: No focal ventilation defect.  Perfusion: No defects.  IMPRESSION: Normal lung perfusion.   Electronically Signed   By: Marnee Spring M.D.   On: 01/02/2015 03:42   Scheduled: . furosemide  80 mg Intravenous 3 times per day  . hydrALAZINE  25 mg Oral 3 times per day  . insulin aspart  0-15 Units Subcutaneous TID WC  . insulin aspart  0-5 Units Subcutaneous QHS  . insulin detemir  35 Units  Subcutaneous BID  . methylPREDNISolone (SOLU-MEDROL) injection  500 mg Intravenous Q24H  . metoprolol tartrate  25 mg Oral BID  . pneumococcal 23 valent vaccine  0.5 mL Intramuscular Tomorrow-1000  . sodium chloride  3 mL Intravenous Q12H     LOS: 2 days   Ayriel Texidor C 01/02/2015,10:07 AM

## 2015-01-02 NOTE — Sedation Documentation (Signed)
02 2L/Wheeler applied for procedure  

## 2015-01-02 NOTE — Progress Notes (Signed)
Back has arrived back into his room. Alert and asking for something to eat. Band-aide intact and clean.

## 2015-01-02 NOTE — Procedures (Signed)
Interventional Radiology Procedure Note  Procedure: CT guided medical/renal biopsy.  Left sided.  4 x 18g core.  Complications: None Recommendations:  - Supine position 3 hours post.  - observe vitals signs, and for any post-op increasing pain - Ok to shower tomorrow - Do not submerge - Routine care  - f/u bx result  Signed,  Yvone Neu. Loreta Ave, DO

## 2015-01-02 NOTE — Progress Notes (Signed)
Ambulated to bathroom ,instructed to remain in bed for next 2 hours and we will observe urine and monitor for bleeding.Patient verbalized understanding.

## 2015-01-02 NOTE — Sedation Documentation (Signed)
Bandaid to L back area intact

## 2015-01-02 NOTE — Progress Notes (Signed)
Patient educated to lay flat- he insist on eating first. Patient ate a sandwich and RN asked that he laid down- he stated that dinner will be here soon, that he will lay down after that.

## 2015-01-02 NOTE — Progress Notes (Signed)
[facility] PROGRESS NOTE  Charles Montgomery HUO:[id number] DOB: 30-Oct-1965 DOA: 12/31/2014 PCP: Georgann Housekeeper, MD  Assessment/Plan:  Acute on CK D stage III- patient has been taking NSAIDs and doxycycline at home. Creatinine went up to 4.37 from baseline of 1.58 on 12/17/2014. Patient was given Lasix yesterday, nephrology has been consulted and workup for the acute kidney injury is underway. Renal ultrasound was normal  Dyspnea- likely due to CHF, patient was started on IV Lasix yesterday, continue Lasix 80 mg IV every 8 hours. Echocardiogram is pending  Hypertension- Blood pressure still elevated, will start metoprolol 25 mg by mouth twice a day, continue when necessary hydralazine.  Diabetes mellitus- continue Lantus, sliding-scale insulin. Metformin on hold.  Left lower extremity DVT- patient found to have left lower ext DVT in the peroneal vein. Discussed with Dr Arlean Hopping, patient to undergo  Kidney biopsy in am, will not be able to anticoagulate. Patient is refusing IVC filter. Called and discussed with vascular surgeon on call Dr Darrick Penna, who recommends to hold the anticoagulation and repeat lower ext doppler after kidney biospy. He will put a note in epic for the recommnedations.   Code Status: Full code Family Communication: *No family at bedside Disposition Plan: Home in medically stable   Consultants:  Nephrology  Procedures:  None  Antibiotics:  None  HPI/Subjective: 49 year old male with a history of hypertension, diabetes mellitus came with worsening shortness of breath and swelling of both lower extremities. Patient found to be in acute kidney injury. Yesterday  Objective: Filed Vitals:   01/02/15 1011  BP: 128/116  Pulse: 105  Temp: 98.2 F (36.8 C)  Resp: 18    Intake/Output Summary (Last 24 hours) at 01/02/15 1419 Last data filed at 01/02/15 1012  Gross per 24 hour  Intake    820 ml  Output    300 ml  Net    520 ml   Filed Weights    12/31/14 0300 01/01/15 0420 01/02/15 0500  Weight: 139.935 kg (308 lb 8 oz) 140.621 kg (310 lb 0.2 oz) 135.739 kg (299 lb 4 oz)    Exam:   General:  Appears in no acute distress  Cardiovascular: S1-S2 regular  Respiratory: Clear to auscultation bilaterally  Abdomen: Soft, nontender, no organomegaly  Musculoskeletal: No edema of the lower extremities   Data Reviewed: Basic Metabolic Panel:  Recent Labs Lab 12/30/14 2123 12/31/14 0620 01/01/15 0940 01/02/15 0406  NA 134* 133* 134* 131*  K 4.7 4.4 4.3 5.0  CL 109 108 104 98*  CO2 17* 20* 21* 22  GLUCOSE 241* 248* 267* 499*  BUN 38* 35* 38* 45*  CREATININE 4.37* 4.42* 4.32* 4.46*  CALCIUM 8.3* 7.9* 8.4* 9.0   Liver Function Tests:  Recent Labs Lab 12/30/14 2123  AST 40  ALT 30  ALKPHOS 111  BILITOT 0.8  PROT 6.4*  ALBUMIN 1.9*    Recent Labs Lab 12/30/14 2123  LIPASE 61*   No results for input(s): AMMONIA in the last 168 hours. CBC:  Recent Labs Lab 12/30/14 2123 12/31/14 0620  WBC 8.4 7.4  HGB 13.2 11.6*  HCT 38.2* 34.4*  MCV 75.6* 75.3*  PLT 267 241   Cardiac Enzymes:  Recent Labs Lab 12/31/14 1854  CKTOTAL 127   BNP (last 3 results)  Recent Labs  12/30/14 2123  BNP 371.8*    ProBNP (last 3 results) No results for input(s): PROBNP in the last 8760 hours.  CBG:  Recent Labs Lab 01/01/15 1138 01/01/15 1635 01/01/15 2011 01/02/15 5520  01/02/15 1153  GLUCAP 194* 244* 322* 419* 435*    Recent Results (from the past 240 hour(s))  Urine culture     Status: None   Collection Time: 12/31/14  9:52 AM  Result Value Ref Range Status   Specimen Description URINE, CLEAN CATCH  Final   Special Requests Normal  Final   Culture 6,000 COLONIES/mL INSIGNIFICANT GROWTH  Final   Report Status 01/01/2015 FINAL  Final     Studies: Dg Chest 2 View  01/01/2015   CLINICAL DATA:  Left upper quadrant abdominal pain, DVT  EXAM: CHEST  2 VIEW  COMPARISON:  12/30/2014  FINDINGS: Lungs are  clear.  No pleural effusion or pneumothorax.  Mild cardiomegaly.  Visualized osseous structures are within normal limits.  IMPRESSION: No evidence of acute cardiopulmonary disease.   Electronically Signed   By: Charline Bills M.D.   On: 01/01/2015 19:01   Nm Pulmonary Perf And Vent  01/02/2015   CLINICAL DATA:  Shortness of breath  EXAM: NUCLEAR MEDICINE VENTILATION - PERFUSION LUNG SCAN  TECHNIQUE: Ventilation images were obtained in multiple projections using inhaled aerosol Tc-22m DTPA. Perfusion images were obtained in multiple projections after intravenous injection of Tc-48m MAA.  RADIOPHARMACEUTICALS:  43.4 Technetium-11m DTPA aerosol inhalation and 62.7 Technetium-32m MAA IV  COMPARISON:  Chest x-ray from the same day  FINDINGS: Ventilation: No focal ventilation defect.  Perfusion: No defects.  IMPRESSION: Normal lung perfusion.   Electronically Signed   By: Marnee Spring M.D.   On: 01/02/2015 03:42    Scheduled Meds: . carvedilol  6.25 mg Oral BID WC  . fentaNYL      . furosemide  160 mg Oral BID  . hydrALAZINE  25 mg Oral 3 times per day  . insulin aspart  0-15 Units Subcutaneous TID WC  . insulin aspart  0-5 Units Subcutaneous QHS  . insulin detemir  35 Units Subcutaneous BID  . isosorbide mononitrate  30 mg Oral Daily  . midazolam      . pneumococcal 23 valent vaccine  0.5 mL Intramuscular Tomorrow-1000  . sodium chloride  3 mL Intravenous Q12H   Continuous Infusions:   Active Problems:   Acute renal failure syndrome   Nephrotic syndrome   Hypoalbuminemia   Peripheral edema   Diabetes mellitus   Hypertension   AKI (acute kidney injury)   AP (abdominal pain)   Proteinuria   Edema   SOB (shortness of breath)   Acute systolic heart failure    Time spent: 20 min    [facility]  [facility] Pager [phone]*. If 7PM-7AM, please contact night-coverage at [email or web address], password [facility] 01/02/2015, 2:19 PM  LOS: 2 days

## 2015-01-02 NOTE — Progress Notes (Signed)
[facility] PROGRESS NOTE  Connie Hilgert ZOX:[id number] DOB: 17-Sep-1965 DOA: 12/31/2014 PCP: Georgann Housekeeper, MD  Assessment/Plan:  Acute on CKD stage III- patient has been taking NSAIDs and doxycycline at home. Creatinine went up to 4.37 from baseline of 1.58 on 12/17/2014. Patient was given Lasix yesterday, nephrology has been consulted and workup for the acute kidney injury is underway. Renal ultrasound was normal  Dyspnea- likely due to CHF, patient was started on IV Lasix yesterday, continue Lasix 80 mg IV every 8 hours. Echocardiogram showed EF 25%. VQ scan negative for pulmonary embolism  Hypertension- . Patient was started on metoprolol 25 g twice a day which was changed to Coreg 6.25 twice a day per cardiology. Continue hydralazine 25 mg by mouth every 8 hours  Diabetes mellitus- continue Lantus, sliding-scale insulin. Metformin on hold.   Left lower extremity DVT- patient found to have left lower ext DVT in the peroneal vein. Discussed with Dr Arlean Hopping, patient to undergo  Kidney biopsy in am, will not be able to anticoagulate. Patient is refusing IVC filter. Called and discussed with vascular surgeon on call Dr Darrick Penna, who recommends to hold the anticoagulation and repeat lower ext doppler after kidney biospy. He will put a note in epic for the recommnedations. (Vascular surgery recommendations) Would place on full anticoagulation as soon as possible after biopsy for 3 months. If worsening leg symptoms or unable to start anticoagulation in less than 7 days would repeat duplex in 1 week. If propagation of clot into popliteal vein at that time he would need an IVC filter if he cannot be anticoagulated.   Code Status: Full code Family Communication: No family at bedside Disposition Plan: Home in medically stable   Consultants:  Nephrology  Procedures:  None  Antibiotics:  None  HPI/Subjective: 49 year old male with a history of hypertension, diabetes mellitus  came with worsening shortness of breath and swelling of both lower extremities. Patient found to be in acute kidney injury. This morning patient feels good, yesterday had left sided chest pain, V/Q scan was negative. He denies chest pain this morning.  Objective: Filed Vitals:   01/02/15 1011  BP: 128/116  Pulse: 105  Temp: 98.2 F (36.8 C)  Resp: 18    Intake/Output Summary (Last 24 hours) at 01/02/15 1427 Last data filed at 01/02/15 1012  Gross per 24 hour  Intake    820 ml  Output    300 ml  Net    520 ml   Filed Weights   12/31/14 0300 01/01/15 0420 01/02/15 0500  Weight: 139.935 kg (308 lb 8 oz) 140.621 kg (310 lb 0.2 oz) 135.739 kg (299 lb 4 oz)    Exam:   General:  Appears in no acute distress  Cardiovascular: S1-S2 regular  Respiratory: Clear to auscultation bilaterally  Abdomen: Soft, nontender, no organomegaly  Musculoskeletal: No edema of the lower extremities   Data Reviewed: Basic Metabolic Panel:  Recent Labs Lab 12/30/14 2123 12/31/14 0620 01/01/15 0940 01/02/15 0406  NA 134* 133* 134* 131*  K 4.7 4.4 4.3 5.0  CL 109 108 104 98*  CO2 17* 20* 21* 22  GLUCOSE 241* 248* 267* 499*  BUN 38* 35* 38* 45*  CREATININE 4.37* 4.42* 4.32* 4.46*  CALCIUM 8.3* 7.9* 8.4* 9.0   Liver Function Tests:  Recent Labs Lab 12/30/14 2123  AST 40  ALT 30  ALKPHOS 111  BILITOT 0.8  PROT 6.4*  ALBUMIN 1.9*    Recent Labs Lab 12/30/14 2123  LIPASE 61*  No results for input(s): AMMONIA in the last 168 hours. CBC:  Recent Labs Lab 12/30/14 2123 12/31/14 0620  WBC 8.4 7.4  HGB 13.2 11.6*  HCT 38.2* 34.4*  MCV 75.6* 75.3*  PLT 267 241   Cardiac Enzymes:  Recent Labs Lab 12/31/14 1854  CKTOTAL 127   BNP (last 3 results)  Recent Labs  12/30/14 2123  BNP 371.8*    ProBNP (last 3 results) No results for input(s): PROBNP in the last 8760 hours.  CBG:  Recent Labs Lab 01/01/15 1138 01/01/15 1635 01/01/15 2011 01/02/15 0759  01/02/15 1153  GLUCAP 194* 244* 322* 419* 435*    Recent Results (from the past 240 hour(s))  Urine culture     Status: None   Collection Time: 12/31/14  9:52 AM  Result Value Ref Range Status   Specimen Description URINE, CLEAN CATCH  Final   Special Requests Normal  Final   Culture 6,000 COLONIES/mL INSIGNIFICANT GROWTH  Final   Report Status 01/01/2015 FINAL  Final     Studies: Dg Chest 2 View  01/01/2015   CLINICAL DATA:  Left upper quadrant abdominal pain, DVT  EXAM: CHEST  2 VIEW  COMPARISON:  12/30/2014  FINDINGS: Lungs are clear.  No pleural effusion or pneumothorax.  Mild cardiomegaly.  Visualized osseous structures are within normal limits.  IMPRESSION: No evidence of acute cardiopulmonary disease.   Electronically Signed   By: Charline Bills M.D.   On: 01/01/2015 19:01   Nm Pulmonary Perf And Vent  01/02/2015   CLINICAL DATA:  Shortness of breath  EXAM: NUCLEAR MEDICINE VENTILATION - PERFUSION LUNG SCAN  TECHNIQUE: Ventilation images were obtained in multiple projections using inhaled aerosol Tc-44m DTPA. Perfusion images were obtained in multiple projections after intravenous injection of Tc-52m MAA.  RADIOPHARMACEUTICALS:  43.4 Technetium-4m DTPA aerosol inhalation and 62.7 Technetium-57m MAA IV  COMPARISON:  Chest x-ray from the same day  FINDINGS: Ventilation: No focal ventilation defect.  Perfusion: No defects.  IMPRESSION: Normal lung perfusion.   Electronically Signed   By: Marnee Spring M.D.   On: 01/02/2015 03:42    Scheduled Meds: . carvedilol  6.25 mg Oral BID WC  . fentaNYL      . furosemide  160 mg Oral BID  . hydrALAZINE  25 mg Oral 3 times per day  . insulin aspart  0-15 Units Subcutaneous TID WC  . insulin aspart  0-5 Units Subcutaneous QHS  . insulin detemir  35 Units Subcutaneous BID  . isosorbide mononitrate  30 mg Oral Daily  . midazolam      . pneumococcal 23 valent vaccine  0.5 mL Intramuscular Tomorrow-1000  . sodium chloride  3 mL Intravenous  Q12H   Continuous Infusions:   Active Problems:   Acute renal failure syndrome   Nephrotic syndrome   Hypoalbuminemia   Peripheral edema   Diabetes mellitus   Hypertension   AKI (acute kidney injury)   AP (abdominal pain)   Proteinuria   Edema   SOB (shortness of breath)   Acute systolic heart failure    Time spent: 20 min    [facility]  [facility] Pager [phone]*. If 7PM-7AM, please contact night-coverage at [email or web address], password [facility] 01/02/2015, 2:27 PM  LOS: 2 days

## 2015-01-03 DIAGNOSIS — I10 Essential (primary) hypertension: Secondary | ICD-10-CM

## 2015-01-03 DIAGNOSIS — E1069 Type 1 diabetes mellitus with other specified complication: Secondary | ICD-10-CM

## 2015-01-03 LAB — GLUCOSE, CAPILLARY
GLUCOSE-CAPILLARY: 570 mg/dL — AB (ref 65–99)
GLUCOSE-CAPILLARY: 234 mg/dL — AB (ref 65–99)
Glucose-Capillary: 213 mg/dL — ABNORMAL HIGH (ref 65–99)
Glucose-Capillary: 233 mg/dL — ABNORMAL HIGH (ref 65–99)
Glucose-Capillary: 288 mg/dL — ABNORMAL HIGH (ref 65–99)

## 2015-01-03 LAB — BASIC METABOLIC PANEL
ANION GAP: 10 (ref 5–15)
Anion gap: 11 (ref 5–15)
BUN: 67 mg/dL — ABNORMAL HIGH (ref 6–20)
BUN: 69 mg/dL — ABNORMAL HIGH (ref 6–20)
CALCIUM: 8.2 mg/dL — AB (ref 8.9–10.3)
CO2: 21 mmol/L — ABNORMAL LOW (ref 22–32)
CO2: 22 mmol/L (ref 22–32)
Calcium: 8.1 mg/dL — ABNORMAL LOW (ref 8.9–10.3)
Chloride: 100 mmol/L — ABNORMAL LOW (ref 101–111)
Chloride: 95 mmol/L — ABNORMAL LOW (ref 101–111)
Creatinine, Ser: 5 mg/dL — ABNORMAL HIGH (ref 0.61–1.24)
Creatinine, Ser: 5.32 mg/dL — ABNORMAL HIGH (ref 0.61–1.24)
GFR calc Af Amer: 13 mL/min — ABNORMAL LOW (ref >60)
GFR calc non Af Amer: 12 mL/min — ABNORMAL LOW (ref >60)
GFR, EST AFRICAN AMERICAN: 15 mL/min — AB (ref >60)
GFR, EST NON AFRICAN AMERICAN: 13 mL/min — AB (ref >60)
Glucose, Bld: 376 mg/dL — ABNORMAL HIGH (ref 65–99)
Glucose, Bld: 650 mg/dL (ref 65–99)
Potassium: 4.2 mmol/L (ref 3.5–5.1)
Potassium: 5.3 mmol/L — ABNORMAL HIGH (ref 3.5–5.1)
SODIUM: 131 mmol/L — AB (ref 135–145)
Sodium: 128 mmol/L — ABNORMAL LOW (ref 135–145)

## 2015-01-03 LAB — ANTINUCLEAR ANTIBODIES, IFA: ANA Ab, IFA: NEGATIVE

## 2015-01-03 LAB — HEPATITIS PANEL, ACUTE
HEP B S AG: NEGATIVE
Hep A IgM: NEGATIVE
Hep B C IgM: NEGATIVE

## 2015-01-03 LAB — ANTISTREPTOLYSIN O TITER: ASO: 46.2 [IU]/mL (ref 0.0–200.0)

## 2015-01-03 LAB — MPO/PR-3 (ANCA) ANTIBODIES: Myeloperoxidase Abs: <9 U/mL (ref 0.0–9.0)

## 2015-01-03 MED ORDER — INSULIN ASPART 100 UNIT/ML ~~LOC~~ SOLN
10.0000 [IU] | Freq: Once | SUBCUTANEOUS | Status: AC
  Administered 2015-01-03: 10 [IU] via SUBCUTANEOUS

## 2015-01-03 MED ORDER — CARVEDILOL 12.5 MG PO TABS
12.5000 mg | ORAL_TABLET | Freq: Two times a day (BID) | ORAL | Status: DC
  Administered 2015-01-03 – 2015-01-08 (×10): 12.5 mg via ORAL
  Filled 2015-01-03 (×4): qty 1
  Filled 2015-01-03 (×2): qty 2
  Filled 2015-01-03 (×3): qty 1
  Filled 2015-01-03: qty 2

## 2015-01-03 MED ORDER — INSULIN ASPART 100 UNIT/ML ~~LOC~~ SOLN
10.0000 [IU] | Freq: Three times a day (TID) | SUBCUTANEOUS | Status: DC
  Administered 2015-01-03 – 2015-01-08 (×14): 10 [IU] via SUBCUTANEOUS

## 2015-01-03 MED ORDER — INSULIN ASPART 100 UNIT/ML ~~LOC~~ SOLN
0.0000 [IU] | Freq: Three times a day (TID) | SUBCUTANEOUS | Status: DC
  Administered 2015-01-03: 11 [IU] via SUBCUTANEOUS
  Administered 2015-01-04: 7 [IU] via SUBCUTANEOUS
  Administered 2015-01-04: 20 [IU] via SUBCUTANEOUS
  Administered 2015-01-05: 4 [IU] via SUBCUTANEOUS
  Administered 2015-01-05 (×2): 11 [IU] via SUBCUTANEOUS
  Administered 2015-01-06: 20 [IU] via SUBCUTANEOUS
  Administered 2015-01-06: 7 [IU] via SUBCUTANEOUS
  Administered 2015-01-06: 20 [IU] via SUBCUTANEOUS
  Administered 2015-01-07: 11 [IU] via SUBCUTANEOUS
  Administered 2015-01-07: 20 [IU] via SUBCUTANEOUS
  Administered 2015-01-07: 11 [IU] via SUBCUTANEOUS
  Administered 2015-01-08: 3 [IU] via SUBCUTANEOUS
  Administered 2015-01-08: 7 [IU] via SUBCUTANEOUS

## 2015-01-03 MED ORDER — INSULIN ASPART 100 UNIT/ML ~~LOC~~ SOLN
0.0000 [IU] | Freq: Every day | SUBCUTANEOUS | Status: DC
  Administered 2015-01-03 – 2015-01-04 (×2): 2 [IU] via SUBCUTANEOUS
  Administered 2015-01-06: 5 [IU] via SUBCUTANEOUS
  Administered 2015-01-07: 2 [IU] via SUBCUTANEOUS

## 2015-01-03 MED ORDER — GUAIFENESIN-DM 100-10 MG/5ML PO SYRP
5.0000 mL | ORAL_SOLUTION | ORAL | Status: DC | PRN
  Administered 2015-01-03: 5 mL via ORAL
  Filled 2015-01-03: qty 5

## 2015-01-03 NOTE — Progress Notes (Addendum)
[facility] PROGRESS NOTE  Charles Montgomery ZOX:[id number] DOB: [birth year] DOA: 12/31/2014 PCP: Georgann Housekeeper, MD  Assessment/Plan:  Acute on CKD stage III- patient has been taking NSAIDs and doxycycline at home. Creatinine went up to 4.37 from baseline of 1.58 on 12/17/2014. Patient was given Lasix yesterday, nephrology has been consulted and workup for the acute kidney injury is underway. Renal ultrasound was normal. Today the creatinine is 5.00. Nephrology following.  CHF - patient had symptoms of dyspnea, was started on IV Lasix yesterday, continue Lasix 80 mg IV every 8 hours. Echocardiogram showed EF 25%. VQ scan negative for pulmonary embolism.  Hypertension- . Patient was started on metoprolol 25 g twice a day which was changed to Coreg 6.25 twice a day per cardiology. Continue hydralazine 25 mg by mouth every 8 hours  Diabetes mellitus- Blood glucose is elevated, will start Novolog 10 units subcut tid with meals, Levemir 35 units subcut bid. Will change the sliding scale to resistant.  Left lower extremity DVT- patient found to have left lower ext DVT in the peroneal vein. Discussed with Dr Arlean Hopping, patient to undergo  Kidney biopsy in am, will not be able to anticoagulate. Patient is refusing IVC filter. Called and discussed with vascular surgeon on call Dr Darrick Penna, who recommends to hold the anticoagulation and repeat lower ext doppler after kidney biospy. He will put a note in epic for the recommnedations. (Vascular surgery recommendations) Would place on full anticoagulation as soon as possible after biopsy for 3 months. If worsening leg symptoms or unable to start anticoagulation in less than 7 days would repeat duplex in 1 week. If propagation of clot into popliteal vein at that time he would need an IVC filter if he cannot be anticoagulated.   Code Status: Full code Family Communication: No family at bedside Disposition Plan: Home in medically  stable   Consultants:  Nephrology  Procedures:  None  Antibiotics:  None  HPI/Subjective: 49 year old male with a history of hypertension, diabetes mellitus came with worsening shortness of breath and swelling of both lower extremities. Patient found to be in acute kidney injury. This morning patient feels good, yesterday had left sided chest pain, V/Q scan was negative.  Patient had renal biopsy done yesterday, denies any complaints.  Objective: Filed Vitals:   01/03/15 0848  BP: 121/70  Pulse: 105  Temp: 98.1 F (36.7 C)  Resp: 18    Intake/Output Summary (Last 24 hours) at 01/03/15 1423 Last data filed at 01/03/15 0849  Gross per 24 hour  Intake   1810 ml  Output   2400 ml  Net   -590 ml   Filed Weights   01/01/15 0420 01/02/15 0500 01/02/15 2100  Weight: 140.621 kg (310 lb 0.2 oz) 135.739 kg (299 lb 4 oz) 134.9 kg (297 lb 6.4 oz)    Exam:   General:  Appears in no acute distress  Cardiovascular: S1-S2 regular  Respiratory: Clear to auscultation bilaterally  Abdomen: Soft, nontender, no organomegaly  Musculoskeletal: No edema of the lower extremities   Data Reviewed: Basic Metabolic Panel:  Recent Labs Lab 12/31/14 0620 01/01/15 0940 01/02/15 0406 01/02/15 2345 01/03/15 0515  NA 133* 134* 131* 128* 131*  K 4.4 4.3 5.0 5.3* 4.2  CL 108 104 98* 95* 100*  CO2 20* 21* 22 22 21*  GLUCOSE 248* 267* 499* 650* 376*  BUN 35* 38* 45* 67* 69*  CREATININE 4.42* 4.32* 4.46* 5.32* 5.00*  CALCIUM 7.9* 8.4* 9.0 8.1* 8.2*   Liver Function Tests:  Recent Labs Lab 12/30/14 2123  AST 40  ALT 30  ALKPHOS 111  BILITOT 0.8  PROT 6.4*  ALBUMIN 1.9*    Recent Labs Lab 12/30/14 2123  LIPASE 61*   CBC:  Recent Labs Lab 12/30/14 2123 12/31/14 0620  WBC 8.4 7.4  HGB 13.2 11.6*  HCT 38.2* 34.4*  MCV 75.6* 75.3*  PLT 267 241   Cardiac Enzymes:  Recent Labs Lab 12/31/14 1854  CKTOTAL 127   BNP (last 3 results)  Recent Labs   12/30/14 2123  BNP 371.8*     CBG:  Recent Labs Lab 01/02/15 1638 01/02/15 2119 01/03/15 0119 01/03/15 0835 01/03/15 1128  GLUCAP 204* 520* 570* 213* 233*    Recent Results (from the past 240 hour(s))  Urine culture     Status: None   Collection Time: 12/31/14  9:52 AM  Result Value Ref Range Status   Specimen Description URINE, CLEAN CATCH  Final   Special Requests Normal  Final   Culture 6,000 COLONIES/mL INSIGNIFICANT GROWTH  Final   Report Status 01/01/2015 FINAL  Final     Studies: Dg Chest 2 View  01/01/2015   CLINICAL DATA:  Left upper quadrant abdominal pain, DVT  EXAM: CHEST  2 VIEW  COMPARISON:  12/30/2014  FINDINGS: Lungs are clear.  No pleural effusion or pneumothorax.  Mild cardiomegaly.  Visualized osseous structures are within normal limits.  IMPRESSION: No evidence of acute cardiopulmonary disease.   Electronically Signed   By: Charline Bills M.D.   On: 01/01/2015 19:01   Nm Pulmonary Perf And Vent  01/02/2015   CLINICAL DATA:  Shortness of breath  EXAM: NUCLEAR MEDICINE VENTILATION - PERFUSION LUNG SCAN  TECHNIQUE: Ventilation images were obtained in multiple projections using inhaled aerosol Tc-30m DTPA. Perfusion images were obtained in multiple projections after intravenous injection of Tc-73m MAA.  RADIOPHARMACEUTICALS:  43.4 Technetium-41m DTPA aerosol inhalation and 62.7 Technetium-71m MAA IV  COMPARISON:  Chest x-ray from the same day  FINDINGS: Ventilation: No focal ventilation defect.  Perfusion: No defects.  IMPRESSION: Normal lung perfusion.   Electronically Signed   By: Marnee Spring M.D.   On: 01/02/2015 03:42   Ct Biopsy  01/02/2015   CLINICAL DATA:  49 year old male with a history of chronic kidney disease. He has been referred for image guided medical renal biopsy.  EXAM: CT-GUIDED BIOPSY left kidney for medical renal purpose  MEDICATIONS AND MEDICAL HISTORY: Versed 1.0 mg, Fentanyl 50 mcg.  Additional Medications: None.  ANESTHESIA/SEDATION:  Moderate sedation time: 15 minutes  PROCEDURE: The procedure, risks, benefits, and alternatives were explained to the patient. Questions regarding the procedure were encouraged and answered. The patient understands and consents to the procedure.  Patient is position in the prone position on CT gantry table and a scout CT of the kidneys was performed.  The left flank was prepped with Betadine in a sterile fashion, and a sterile drape was applied covering the operative field. A sterile gown and sterile gloves were used for the procedure.  Once the patient is prepped and draped in the usual sterile fashion, the skin and subcutaneous tissues were generously infiltrated with 1% lidocaine for local anesthesia.  Under CT guidance, a(n) 17 gauge guide needle was advanced into the lateral cortex of the lower pole of the left kidney. Four separate 18 gauge core biopsy were obtained. Specimen was placed in saline for transportation to the lab.  Two Gel-Foam pledgets were infused with a small amount of saline.  Final CT  image was acquired.  The patient tolerated the procedure well and remained hemodynamically stable throughout.  No complications were encountered and no significant blood loss was encountered.  FINDINGS: CT images during the case demonstrate placement of the needle tip of trocar guide needle into the lateral cortex of the lower pole of the left kidney.  Images after the case demonstrate small amount of expected fluid adjacent to the biopsy site.  Patient remained hemodynamically stable throughout.  COMPLICATIONS: None  IMPRESSION: Status post CT-guided core biopsy of the left kidney for medical renal purpose. Tissue specimen sent to pathology for complete histopathologic analysis.  Signed,  Yvone Neu. Loreta Ave, DO  [facility]  [facility]   Electronically Signed   By: Gilmer Mor D.O.   On: 01/02/2015 17:53    Scheduled Meds: . carvedilol  12.5 mg Oral BID WC  .  furosemide  160 mg Oral BID  . hydrALAZINE  25 mg Oral 3 times per day  . insulin aspart  0-20 Units Subcutaneous TID WC  . insulin aspart  0-5 Units Subcutaneous QHS  . insulin aspart  10 Units Subcutaneous TID WC  . insulin detemir  35 Units Subcutaneous BID  . isosorbide mononitrate  30 mg Oral Daily  . pneumococcal 23 valent vaccine  0.5 mL Intramuscular Tomorrow-1000  . sodium chloride  3 mL Intravenous Q12H   Continuous Infusions:   Active Problems:   Acute renal failure syndrome   Nephrotic syndrome   Hypoalbuminemia   Peripheral edema   Diabetes mellitus   Hypertension   AKI (acute kidney injury)   AP (abdominal pain)   Proteinuria   Edema   SOB (shortness of breath)   Acute systolic heart failure    Time spent: 20 min    [facility] S  [facility] Pager [phone]*. If 7PM-7AM, please contact night-coverage at [email or web address], password [facility] 01/03/2015, 2:23 PM  LOS: 3 days

## 2015-01-03 NOTE — Progress Notes (Signed)
Inpatient Diabetes Program Recommendations  AACE/ADA: New Consensus Statement on Inpatient Glycemic Control (2013)  Target Ranges:  Prepandial:   less than 140 mg/dL      Peak postprandial:   less than 180 mg/dL (1-2 hours)      Critically ill patients:  140 - 180 mg/dL   Inpatient Diabetes Program Recommendations Correction (SSI): Increase to RESISTANT scale  Insulin - Meal Coverage: add Novolog 10 units with meals   Thank you  Piedad Climes BSN, RN,CDE Inpatient Diabetes Coordinator [phone] (team pager)

## 2015-01-03 NOTE — Progress Notes (Signed)
Patient Profile: 49 year old male with recently diagnosed nephrotic syndrome and acute systolic heart failure/newly diagnosed cardiomyopathy with ejection fraction of 20-25% with underlying hypertension, family history of end-stage renal disease. Heavy alcohol use, prior cocaine use, smoker.  Subjective: No complaints. Feels significantly better. No further dyspnea. He does note mild palpitations earlier this am. No syncope/ near syncope.   Objective: Vital signs in last 24 hours: Temp:  [97.5 F (36.4 C)-98.2 F (36.8 C)] 98.1 F (36.7 C) (08/16 0848) Pulse Rate:  [90-105] 105 (08/16 0848) Resp:  [16-22] 18 (08/16 0848) BP: (114-146)/(70-87) 121/70 mmHg (08/16 0848) SpO2:  [96 %-100 %] 99 % (08/16 0848) Weight:  [297 lb 6.4 oz (134.9 kg)] 297 lb 6.4 oz (134.9 kg) (08/15 2100) Last BM Date: 01/02/15  Intake/Output from previous day: 08/15 0701 - 08/16 0700 In: 2150 [P.O.:2150] Out: 1450 [Urine:1450] Intake/Output this shift: Total I/O In: 240 [P.O.:240] Out: 200 [Urine:200]  Medications Current Facility-Administered Medications  Medication Dose Route Frequency Provider Last Rate Last Dose  . acetaminophen (TYLENOL) tablet 650 mg  650 mg Oral Q6H PRN Ron Parker, MD       Or  . acetaminophen (TYLENOL) suppository 650 mg  650 mg Rectal Q6H PRN Ron Parker, MD      . alum & mag hydroxide-simeth (MAALOX/MYLANTA) 200-200-20 MG/5ML suspension 30 mL  30 mL Oral Q6H PRN Ron Parker, MD   30 mL at 01/01/15 1245  . carvedilol (COREG) tablet 6.25 mg  6.25 mg Oral BID WC Jake Bathe, MD   6.25 mg at 01/03/15 0902  . cloNIDine (CATAPRES) tablet 0.1 mg  0.1 mg Oral TID PRN Delano Metz, MD      . furosemide (LASIX) tablet 160 mg  160 mg Oral BID Lauris Poag, MD   160 mg at 01/03/15 0902  . guaiFENesin-dextromethorphan (ROBITUSSIN DM) 100-10 MG/5ML syrup 5 mL  5 mL Oral Q4H PRN Meredeth Ide, MD      . hydrALAZINE (APRESOLINE) tablet 25 mg  25 mg Oral 3 times  per day Delano Metz, MD   25 mg at 01/03/15 0649  . HYDROmorphone (DILAUDID) injection 0.5-1 mg  0.5-1 mg Intravenous Q3H PRN Ron Parker, MD   1 mg at 01/01/15 1704  . insulin aspart (novoLOG) injection 0-15 Units  0-15 Units Subcutaneous TID WC Meredeth Ide, MD   5 Units at 01/03/15 [phone]  . insulin aspart (novoLOG) injection 0-5 Units  0-5 Units Subcutaneous QHS Meredeth Ide, MD   0 Units at 01/03/15 0200  . insulin detemir (LEVEMIR) injection 35 Units  35 Units Subcutaneous BID Meredeth Ide, MD   35 Units at 01/02/15 2253  . iohexol (OMNIPAQUE) 300 MG/ML solution 25 mL  25 mL Oral Once PRN Medication Radiologist, MD      . iohexol (OMNIPAQUE) 300 MG/ML solution 25 mL  25 mL Oral Once PRN Medication Radiologist, MD      . isosorbide mononitrate (IMDUR) 24 hr tablet 30 mg  30 mg Oral Daily Jake Bathe, MD   30 mg at 01/02/15 1215  . oxyCODONE (Oxy IR/ROXICODONE) immediate release tablet 5 mg  5 mg Oral Q4H PRN Ron Parker, MD   5 mg at 01/01/15 1602  . pneumococcal 23 valent vaccine (PNU-IMMUNE) injection 0.5 mL  0.5 mL Intramuscular Tomorrow-1000 Harvette C Jenkins, MD      . sodium chloride 0.9 % injection 3 mL  3 mL Intravenous Q12H Harvette C Jenkins,  MD   3 mL at 01/02/15 2200  . technetium TC 72M diethylenetriame-pentaacetic acid (DTPA) injection 43.4 milli Curie  43.4 milli Curie Inhalation Once PRN Medication Radiologist, MD        PE: General appearance: alert, cooperative, no distress and moderately obese Neck: no carotid bruit and no JVD Lungs: clear to auscultation bilaterally Heart: regular rhythm, tachy rate, no murmurs Extremities: no LEE Pulses: 2+ and symmetric Skin: warm and dry Neurologic: Grossly normal  Lab Results:  No results for input(s): WBC, HGB, HCT, PLT in the last 72 hours. BMET  Recent Labs  01/02/15 0406 01/02/15 2345 01/03/15 0515  NA 131* 128* 131*  K 5.0 5.3* 4.2  CL 98* 95* 100*  CO2 22 22 21*  GLUCOSE 499* 650* 376*  BUN  45* 67* 69*  CREATININE 4.46* 5.32* 5.00*  CALCIUM 9.0 8.1* 8.2*   PT/INR  Recent Labs  01/02/15 0959  LABPROT 16.0*  INR 1.26    Assessment/Plan  Active Problems:   Acute renal failure syndrome   Nephrotic syndrome   Hypoalbuminemia   Peripheral edema   Diabetes mellitus   Hypertension   AKI (acute kidney injury)   AP (abdominal pain)   Proteinuria   Edema   SOB (shortness of breath)   Acute systolic heart failure   1. Acute Systolic Heart Failure/Cardiomyopathy: suspect likely nonischemic/hypertensive/ alcoholic dialated cardiomyopathy given history of HTN and heavy alcohol abuse (case of beer/day). However, cannot fully exclude ischemic cardiomyopathy. However, given elevated SCr ~5.0, will not pursue LHC at this time. Continue medical management for HF for now. He is diuresing well. -1.5L yesterday and 3.4L total since admit. Breathing improved. Continue high dose furosemide, 160 mg twice a day as prescribed by nephrology. Continue BB therapy with Coreg and hydralazine + Imdur for afterload reduction, in place of ACE/ARB, given renal disease. Continue low sodium diet, strict I/Os and daily weights.   2. Acute renal failure - Per nephrology, obtaining renal biopsy  3. DVT - Lovenox on hold for renal biopsy - Likely hypercoagulable because of nephrotic syndrome  4. Alcohol use - Discussed alcohol cessation. Understands toxicity to heart.  5. Cocaine use - He admits to using in the past. Understands risk of sudden death.  5. Sinus Tachycardia: patient symptomatic earlier with palpitations. With decreased LV systolic function, he is at risk for ventricular arrhthymias. Telemetry currently shows sinus tach. There are several alarms for NSVT, but this looks to be artifact. However, given his elevated resting rate in the low 100s, would recommend further titration of Coreg to 12.5 mg BID. Monitor BP.    LOS: 3 days    Brittainy M. Sharol Harness, PA-C 01/03/2015 10:30  AM  I have examined the patient and reviewed assessment and plan.  Agree with above as stated.  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 Medical therapy as noted above.    Charliene Inoue S.

## 2015-01-03 NOTE — Progress Notes (Signed)
Assessment: 1. Acute on CKD 3 - active sediment w rbc/wbc casts and heavy proteinuria (13 gm by UPC ratio). 24 hr urine 809 protein. CKD may have from diabetes and recent fevers and took NSAIDs for fever PTA for several days.  S/p Bx 8/15 to r/o post-infectious GN (just completed abx for small axilla abcess) or other acute/ rapidly progressive GN.  Results pending for bx and serologies. ASO neg. Needs to avoid NSAID in the future.  2. 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 Card plans no LHC due to renal insufficiency. Diuresing well on lasix.  3. DM x 20 years  4. HTN - new onset, controlled currently on coreg 6.25mg  bid, lasix 160 po BID, hydral 25 q8hr and imdur 24hr tab 30 daily.  5. Obesity - had discussion about weight loss strategies. Agreed to decreasing quantity of food and exercising.  6    FH of ESRD(father)  Plan: 1. Avoid NSAIDs. F/up serology and bx results. 2. Continue lasix PO  BID.   Subjective: Interval History: feeling better. Tolerated renal bx fine. States less "foaming" of urine today. Stated desire to lose weight control his BP and DM II.   Objective: Vital signs in last 24 hours: Temp:  [97.5 F (36.4 C)-98.2 F (36.8 C)] 97.7 F (36.5 C) (08/16 0455) Pulse Rate:  [90-105] 90 (08/16 0455) Resp:  [16-22] 19 (08/16 0455) BP: (114-146)/(70-116) 146/84 mmHg (08/16 0455) SpO2:  [92 %-100 %] 100 % (08/16 0455) Weight:  [297 lb 6.4 oz (134.9 kg)] 297 lb 6.4 oz (134.9 kg) (08/15 2100) Weight change: -1 lb 13.6 oz (-0.839 kg)  Intake/Output from previous day: 08/15 0701 - 08/16 0700 In: 2150 [P.O.:2150] Out: 1450 [Urine:1450] Intake/Output this shift:    General appearance: alert and cooperative Resp: clear to auscultation bilaterally Chest wall: no tenderness Cardio: regular rate and rhythm, S1, S2 normal, no murmur, click, rub or gallop Extremities: edema 1+ b/l LE's.   Lab Results: No results for input(s): WBC, HGB, HCT, PLT in the  last 72 hours. BMET:   Recent Labs  01/02/15 2345 01/03/15 0515  NA 128* 131*  K 5.3* 4.2  CL 95* 100*  CO2 22 21*  GLUCOSE 650* 376*  BUN 67* 69*  CREATININE 5.32* 5.00*  CALCIUM 8.1* 8.2*   No results for input(s): PTH in the last 72 hours. Iron Studies: No results for input(s): IRON, TIBC, TRANSFERRIN, FERRITIN in the last 72 hours. Studies/Results: Dg Chest 2 View  01/01/2015   CLINICAL DATA:  Left upper quadrant abdominal pain, DVT  EXAM: CHEST  2 VIEW  COMPARISON:  12/30/2014  FINDINGS: Lungs are clear.  No pleural effusion or pneumothorax.  Mild cardiomegaly.  Visualized osseous structures are within normal limits.  IMPRESSION: No evidence of acute cardiopulmonary disease.   Electronically Signed   By: Charline Bills M.D.   On: 01/01/2015 19:01   Nm Pulmonary Perf And Vent  01/02/2015   CLINICAL DATA:  Shortness of breath  EXAM: NUCLEAR MEDICINE VENTILATION - PERFUSION LUNG SCAN  TECHNIQUE: Ventilation images were obtained in multiple projections using inhaled aerosol Tc-33m DTPA. Perfusion images were obtained in multiple projections after intravenous injection of Tc-41m MAA.  RADIOPHARMACEUTICALS:  43.4 Technetium-95m DTPA aerosol inhalation and 62.7 Technetium-13m MAA IV  COMPARISON:  Chest x-ray from the same day  FINDINGS: Ventilation: No focal ventilation defect.  Perfusion: No defects.  IMPRESSION: Normal lung perfusion.   Electronically Signed   By: Marnee Spring  M.D.   On: 01/02/2015 03:42   Ct Biopsy  01/02/2015   CLINICAL DATA:  49 year old male with a history of chronic kidney disease. He has been referred for image guided medical renal biopsy.  EXAM: CT-GUIDED BIOPSY left kidney for medical renal purpose  MEDICATIONS AND MEDICAL HISTORY: Versed 1.0 mg, Fentanyl 50 mcg.  Additional Medications: None.  ANESTHESIA/SEDATION: Moderate sedation time: 15 minutes  PROCEDURE: The procedure, risks, benefits, and alternatives were explained to the patient. Questions regarding  the procedure were encouraged and answered. The patient understands and consents to the procedure.  Patient is position in the prone position on CT gantry table and a scout CT of the kidneys was performed.  The left flank was prepped with Betadine in a sterile fashion, and a sterile drape was applied covering the operative field. A sterile gown and sterile gloves were used for the procedure.  Once the patient is prepped and draped in the usual sterile fashion, the skin and subcutaneous tissues were generously infiltrated with 1% lidocaine for local anesthesia.  Under CT guidance, a(n) 17 gauge guide needle was advanced into the lateral cortex of the lower pole of the left kidney. Four separate 18 gauge core biopsy were obtained. Specimen was placed in saline for transportation to the lab.  Two Gel-Foam pledgets were infused with a small amount of saline.  Final CT image was acquired.  The patient tolerated the procedure well and remained hemodynamically stable throughout.  No complications were encountered and no significant blood loss was encountered.  FINDINGS: CT images during the case demonstrate placement of the needle tip of trocar guide needle into the lateral cortex of the lower pole of the left kidney.  Images after the case demonstrate small amount of expected fluid adjacent to the biopsy site.  Patient remained hemodynamically stable throughout.  COMPLICATIONS: None  IMPRESSION: Status post CT-guided core biopsy of the left kidney for medical renal purpose. Tissue specimen sent to pathology for complete histopathologic analysis.  Signed,  Yvone Neu. Loreta Ave, DO  [facility]  [facility]   Electronically Signed   By: Gilmer Mor D.O.   On: 01/02/2015 17:53   Scheduled: . carvedilol  6.25 mg Oral BID WC  . furosemide  160 mg Oral BID  . hydrALAZINE  25 mg Oral 3 times per day  . insulin aspart  0-15 Units Subcutaneous TID WC  . insulin aspart  0-5 Units  Subcutaneous QHS  . insulin detemir  35 Units Subcutaneous BID  . isosorbide mononitrate  30 mg Oral Daily  . pneumococcal 23 valent vaccine  0.5 mL Intramuscular Tomorrow-1000  . sodium chloride  3 mL Intravenous Q12H     LOS: 3 days   [name], [name] 01/03/2015,8:36 AM  Renal Attending: As above note, I anticipate he will have diabetic nephropathy plus some ATN, but biopsy is pending.  CM problematic given his young age.  Cont to give diuretics and taper as nedded. Marria Mathison C

## 2015-01-03 NOTE — Progress Notes (Signed)
Utilization review completed. Jamaris Biernat, RN, BSN. 

## 2015-01-04 LAB — BASIC METABOLIC PANEL
Anion gap: 11 (ref 5–15)
BUN: 72 mg/dL — AB (ref 6–20)
CALCIUM: 8.2 mg/dL — AB (ref 8.9–10.3)
CO2: 22 mmol/L (ref 22–32)
CREATININE: 5.21 mg/dL — AB (ref 0.61–1.24)
Chloride: 99 mmol/L — ABNORMAL LOW (ref 101–111)
GFR calc Af Amer: 14 mL/min — ABNORMAL LOW (ref >60)
GFR, EST NON AFRICAN AMERICAN: 12 mL/min — AB (ref >60)
GLUCOSE: 194 mg/dL — AB (ref 65–99)
POTASSIUM: 4.1 mmol/L (ref 3.5–5.1)
SODIUM: 132 mmol/L — AB (ref 135–145)

## 2015-01-04 LAB — GLUCOSE, CAPILLARY
GLUCOSE-CAPILLARY: 114 mg/dL — AB (ref 65–99)
GLUCOSE-CAPILLARY: 181 mg/dL — AB (ref 65–99)
Glucose-Capillary: 428 mg/dL — ABNORMAL HIGH (ref 65–99)
Glucose-Capillary: 326 mg/dL — ABNORMAL HIGH (ref 65–99)
Glucose-Capillary: 237 mg/dL — ABNORMAL HIGH (ref 65–99)

## 2015-01-04 LAB — GLOMERULAR BASEMENT MEMBRANE ANTIBODIES: GBM AB: 11 U (ref 0–20)

## 2015-01-04 MED ORDER — PATIENT'S GUIDE TO USING COUMADIN BOOK
Freq: Once | Status: AC
  Administered 2015-01-04: 20:00:00
  Filled 2015-01-04: qty 1

## 2015-01-04 MED ORDER — WARFARIN VIDEO
Freq: Once | Status: AC
  Administered 2015-01-04: 21:00:00

## 2015-01-04 MED ORDER — HEPARIN (PORCINE) IN NACL 100-0.45 UNIT/ML-% IJ SOLN
1850.0000 [IU]/h | INTRAMUSCULAR | Status: DC
  Administered 2015-01-04 (×2): 1650 [IU]/h via INTRAVENOUS
  Administered 2015-01-05: 1850 [IU]/h via INTRAVENOUS
  Filled 2015-01-04 (×2): qty 250

## 2015-01-04 MED ORDER — WARFARIN SODIUM 10 MG PO TABS
10.0000 mg | ORAL_TABLET | Freq: Once | ORAL | Status: AC
  Administered 2015-01-04: 10 mg via ORAL
  Filled 2015-01-04: qty 1

## 2015-01-04 MED ORDER — ZOLPIDEM TARTRATE 5 MG PO TABS
5.0000 mg | ORAL_TABLET | Freq: Once | ORAL | Status: AC
  Administered 2015-01-04: 5 mg via ORAL
  Filled 2015-01-04: qty 1

## 2015-01-04 MED ORDER — WARFARIN - PHARMACIST DOSING INPATIENT
Freq: Every day | Status: DC
  Administered 2015-01-07: 19:00:00

## 2015-01-04 MED ORDER — FUROSEMIDE 80 MG PO TABS
80.0000 mg | ORAL_TABLET | Freq: Two times a day (BID) | ORAL | Status: DC
  Administered 2015-01-04 – 2015-01-06 (×4): 80 mg via ORAL
  Filled 2015-01-04: qty 1

## 2015-01-04 MED ORDER — HEPARIN BOLUS VIA INFUSION
5000.0000 [IU] | Freq: Once | INTRAVENOUS | Status: AC
  Administered 2015-01-04: 5000 [IU] via INTRAVENOUS
  Filled 2015-01-04: qty 5000

## 2015-01-04 NOTE — Progress Notes (Signed)
SUBJECTIVE:  Feels well this AM.  Has DVT.  Does not want IVC filter.  Waiting for safe time for anticoagulation to treat DVT.  OBJECTIVE:   Vitals:   Filed Vitals:   01/03/15 0848 01/03/15 1715 01/03/15 2100 01/04/15 0440  BP: 121/70 106/60 111/64 127/82  Pulse: 105 100 99 91  Temp: 98.1 F (36.7 C) 97.9 F (36.6 C) 98.4 F (36.9 C) 97.8 F (36.6 C)  TempSrc: Oral Oral Oral Oral  Resp: 18 18 18 18   Weight:   291 lb (131.997 kg)   SpO2: 99% 99% 97% 94%   I&O's:   Intake/Output Summary (Last 24 hours) at 01/04/15 0914 Last data filed at 01/04/15 0600  Gross per 24 hour  Intake   1900 ml  Output   2675 ml  Net   -775 ml       PHYSICAL EXAM General: Well developed, well nourished, in no acute distress Head:   Normal cephalic and atramatic  Lungs:   Clear bilaterally to auscultation. Heart:   HRRR S1 S2  No JVD.   Abdomen: abdomen soft and non-tender Msk:  Back normal,  Normal strength and tone for age. Extremities:   L leg > R leg Neuro: Alert and oriented. Psych:  Normal affect, responds appropriately Skin: No rash   LABS: Basic Metabolic Panel:  Recent Labs  41/66/06 0515 01/04/15 0820  NA 131* 132*  K 4.2 4.1  CL 100* 99*  CO2 21* 22  GLUCOSE 376* 194*  BUN 69* 72*  CREATININE 5.00* 5.21*  CALCIUM 8.2* 8.2*   Liver Function Tests: No results for input(s): AST, ALT, ALKPHOS, BILITOT, PROT, ALBUMIN in the last 72 hours. No results for input(s): LIPASE, AMYLASE in the last 72 hours. CBC: No results for input(s): WBC, NEUTROABS, HGB, HCT, MCV, PLT in the last 72 hours. Cardiac Enzymes: No results for input(s): CKTOTAL, CKMB, CKMBINDEX, TROPONINI in the last 72 hours. BNP: Invalid input(s): POCBNP D-Dimer: No results for input(s): DDIMER in the last 72 hours. Hemoglobin A1C: No results for input(s): HGBA1C in the last 72 hours. Fasting Lipid Panel: No results for input(s): CHOL, HDL, LDLCALC, TRIG, CHOLHDL, LDLDIRECT in the last 72  hours. Thyroid Function Tests: No results for input(s): TSH, T4TOTAL, T3FREE, THYROIDAB in the last 72 hours.  Invalid input(s): FREET3 Anemia Panel: No results for input(s): VITAMINB12, FOLATE, FERRITIN, TIBC, IRON, RETICCTPCT in the last 72 hours. Coag Panel:   Lab Results  Component Value Date   INR 1.26 01/02/2015    RADIOLOGY: Ct Abdomen Pelvis Wo Contrast  12/31/2014   CLINICAL DATA:  Subacute onset of left upper quadrant and left lower quadrant abdominal pain. Nausea, diarrhea and vomiting. Initial encounter.  EXAM: CT ABDOMEN AND PELVIS WITHOUT CONTRAST  TECHNIQUE: Multidetector CT imaging of the abdomen and pelvis was performed following the standard protocol without IV contrast.  COMPARISON:  Lumbar spine radiographs performed 08/30/2013  FINDINGS: Mild bibasilar atelectasis is noted.  The liver and spleen are unremarkable in appearance. The gallbladder is within normal limits. The pancreas and adrenal glands are unremarkable.  Bilateral perinephric stranding and fluid are seen, with stranding tracking along the courses of the ureters bilaterally. Retroperitoneal stranding is also seen. This raises concern for bilateral pyelonephritis. There is no evidence of hydronephrosis. No obstructing ureteral stones are seen.  No free fluid is identified. The small bowel is unremarkable in appearance. The stomach is within normal limits. No acute vascular abnormalities are seen.  The appendix is normal in  caliber and contains air, without evidence of appendicitis. Contrast progresses to the level of the splenic flexure of the colon. The colon is partially filled with stool and is unremarkable in appearance.  The bladder is moderately distended and grossly unremarkable. The prostate remains normal in size. No inguinal lymphadenopathy is seen.  No acute osseous abnormalities are identified. Facet disease is noted at the lower lumbar spine.  IMPRESSION: 1. Bilateral perinephric stranding and fluid, with  stranding tracking along the courses of the ureters, and retroperitoneal stranding. This is concerning for bilateral pyelonephritis. Would correlate for associated symptoms and lab findings. No evidence of hydronephrosis. 2. Mild bibasilar atelectasis noted.   Electronically Signed   By: Roanna Raider M.D.   On: 12/31/2014 04:34   Dg Chest 2 View  01/01/2015   CLINICAL DATA:  Left upper quadrant abdominal pain, DVT  EXAM: CHEST  2 VIEW  COMPARISON:  12/30/2014  FINDINGS: Lungs are clear.  No pleural effusion or pneumothorax.  Mild cardiomegaly.  Visualized osseous structures are within normal limits.  IMPRESSION: No evidence of acute cardiopulmonary disease.   Electronically Signed   By: Charline Bills M.D.   On: 01/01/2015 19:01   Dg Chest 2 View  12/30/2014   CLINICAL DATA:  Shortness breath for 1 month.  Diabetes.  EXAM: CHEST - 2 VIEW  COMPARISON:  Two-view chest x-ray 03/24/2012  FINDINGS: The heart is enlarged. Mild pulmonary vascular congestion is present. No focal airspace disease is present. There are no effusions.  IMPRESSION: Cardiomegaly and mild pulmonary vascular congestion.   Electronically Signed   By: Marin Roberts M.D.   On: 12/30/2014 21:46   US Renal  12/31/2014   CLINICAL DATA:  Nephrotic syndrome.  EXAM: RENAL / URINARY TRACT ULTRASOUND COMPLETE  COMPARISON:  CT abdomen 12/31/2014  FINDINGS: Right Kidney:  Length: 12.2 cm. Echogenicity within normal limits. No mass or hydronephrosis visualized.  Left Kidney:  Length: 10.5 cm. Echogenicity within normal limits. No mass or hydronephrosis visualized.  Bladder:  Appears normal for degree of bladder distention.  IMPRESSION: Normal renal ultrasound.   Electronically Signed   By: Elige Ko   On: 12/31/2014 07:45   Nm Pulmonary Perf And Vent  01/02/2015   CLINICAL DATA:  Shortness of breath  EXAM: NUCLEAR MEDICINE VENTILATION - PERFUSION LUNG SCAN  TECHNIQUE: Ventilation images were obtained in multiple projections using  inhaled aerosol Tc-68m DTPA. Perfusion images were obtained in multiple projections after intravenous injection of Tc-35m MAA.  RADIOPHARMACEUTICALS:  43.4 Technetium-68m DTPA aerosol inhalation and 62.7 Technetium-24m MAA IV  COMPARISON:  Chest x-ray from the same day  FINDINGS: Ventilation: No focal ventilation defect.  Perfusion: No defects.  IMPRESSION: Normal lung perfusion.   Electronically Signed   By: Marnee Spring M.D.   On: 01/02/2015 03:42   Ct Biopsy  01/02/2015   CLINICAL DATA:  49 year old male with a history of chronic kidney disease. He has been referred for image guided medical renal biopsy.  EXAM: CT-GUIDED BIOPSY left kidney for medical renal purpose  MEDICATIONS AND MEDICAL HISTORY: Versed 1.0 mg, Fentanyl 50 mcg.  Additional Medications: None.  ANESTHESIA/SEDATION: Moderate sedation time: 15 minutes  PROCEDURE: The procedure, risks, benefits, and alternatives were explained to the patient. Questions regarding the procedure were encouraged and answered. The patient understands and consents to the procedure.  Patient is position in the prone position on CT gantry table and a scout CT of the kidneys was performed.  The left flank was prepped with Betadine  in a sterile fashion, and a sterile drape was applied covering the operative field. A sterile gown and sterile gloves were used for the procedure.  Once the patient is prepped and draped in the usual sterile fashion, the skin and subcutaneous tissues were generously infiltrated with 1% lidocaine for local anesthesia.  Under CT guidance, a(n) 17 gauge guide needle was advanced into the lateral cortex of the lower pole of the left kidney. Four separate 18 gauge core biopsy were obtained. Specimen was placed in saline for transportation to the lab.  Two Gel-Foam pledgets were infused with a small amount of saline.  Final CT image was acquired.  The patient tolerated the procedure well and remained hemodynamically stable throughout.  No  complications were encountered and no significant blood loss was encountered.  FINDINGS: CT images during the case demonstrate placement of the needle tip of trocar guide needle into the lateral cortex of the lower pole of the left kidney.  Images after the case demonstrate small amount of expected fluid adjacent to the biopsy site.  Patient remained hemodynamically stable throughout.  COMPLICATIONS: None  IMPRESSION: Status post CT-guided core biopsy of the left kidney for medical renal purpose. Tissue specimen sent to pathology for complete histopathologic analysis.  Signed,  Yvone Neu. Loreta Ave, DO  [facility]  [facility]   Electronically Signed   By: Gilmer Mor D.O.   On: 01/02/2015 17:53      ASSESSMENT: Roosvelt Harps:  Presumed NICM  1) Continue medical therapy.  Spoke of importance of abstaining from alcohol.  He states he will quit. Unable to take ACE due to renal insufficiency.   2) HTN: Controlled at this time.  Will sign off for now.  He needs f/u with Dr. Anne Fu and echo in a few months to see if EF has improved with medical therapy.   Corky Crafts, MD  01/04/2015  9:14 AM

## 2015-01-04 NOTE — Progress Notes (Signed)
Assessment:  49 yo male with DM II, HTN, no health care contact in a long time, here with CHF, AKI, and LLE DVT.  1. Acute on CKD 3 - b/l SCr 1.2 on 07/2014. Had active sediment w rbc/wbc casts and heavy proteinuria (13 gm by UPC ratio). 24 hr urine 809 protein. CKD may have from diabetes/HTN and AKI could be from heavy NSAID use for fever PTA for several days.  S/p Bx 8/15 to r/o post-infectious GN (just completed abx for small axilla abcess) or other acute/ rapidly progressive GN.  Results pending for bx, most of serologies are back and normal. 2. 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 Card plans no LHC due to renal insufficiency. Diuresed well on lasix.  3. DM x 20 years  4. HTN - new onset, controlled currently on coreg 6.25mg  bid, lasix 160 po BID, hydral 25 q8hr and imdur 24hr tab 30 daily.  5. Obesity - had discussion about weight loss strategies. Agreed to decreasing quantity of food and exercising.  6    FH of ESRD(father) 7. LLE DVT - off anticoag currently due to recent kidney bx.   Plan: 1. Avoid NSAIDs and other nephrotoxins. F/up bx results. 2. Reduce lasix from 160mg  bid to 80mg  BID today. 3. Can resume anticoag on 01/06/15 for his DVT.  4. Follow up with Dr. Grant Montgomery outpatient.   Subjective: Interval History: feeling better. Again, had long discussion about healthy lifestyle and diet.   Objective: Vital signs in last 24 hours: Temp:  [97.8 F (36.6 Montgomery)-98.4 F (36.9 Montgomery)] 97.9 F (36.6 Montgomery) (08/17 1025) Pulse Rate:  [91-100] 100 (08/17 1025) Resp:  [18] 18 (08/17 1025) BP: (106-127)/(60-87) 126/87 mmHg (08/17 1025) SpO2:  [94 %-99 %] 95 % (08/17 1025) Weight:  [291 lb (131.997 kg)] 291 lb (131.997 kg) (08/16 2100) Weight change: -6 lb 6.4 oz (-2.903 kg)  Intake/Output from previous day: 08/16 0701 - 08/17 0700 In: 2140 [P.O.:2140] Out: 3625 [Urine:3625] Intake/Output this shift: Total I/O In: 480 [P.O.:480] Out: 600 [Urine:600]  General  appearance: alert and cooperative Resp: clear to auscultation bilaterally Chest wall: no tenderness Cardio: regular rate and rhythm, S1, S2 normal, no murmur, click, rub or gallop Extremities: edema 1+ b/l LE's.   Lab Results: No results for input(s): WBC, HGB, HCT, PLT in the last 72 hours. BMET:   Recent Labs  01/03/15 0515 01/04/15 0820  NA 131* 132*  K 4.2 4.1  CL 100* 99*  CO2 21* 22  GLUCOSE 376* 194*  BUN 69* 72*  CREATININE 5.00* 5.21*  CALCIUM 8.2* 8.2*   No results for input(s): PTH in the last 72 hours. Iron Studies: No results for input(s): IRON, TIBC, TRANSFERRIN, FERRITIN in the last 72 hours. Studies/Results: Ct Biopsy  01/02/2015   CLINICAL DATA:  49 year old male with a history of chronic kidney disease. He has been referred for image guided medical renal biopsy.  EXAM: CT-GUIDED BIOPSY left kidney for medical renal purpose  MEDICATIONS AND MEDICAL HISTORY: Versed 1.0 mg, Fentanyl 50 mcg.  Additional Medications: None.  ANESTHESIA/SEDATION: Moderate sedation time: 15 minutes  PROCEDURE: The procedure, risks, benefits, and alternatives were explained to the patient. Questions regarding the procedure were encouraged and answered. The patient understands and consents to the procedure.  Patient is position in the prone position on CT gantry table and a scout CT of the kidneys was performed.  The left flank was prepped with Betadine in a sterile  fashion, and a sterile drape was applied covering the operative field. A sterile gown and sterile gloves were used for the procedure.  Once the patient is prepped and draped in the usual sterile fashion, the skin and subcutaneous tissues were generously infiltrated with 1% lidocaine for local anesthesia.  Under CT guidance, a(n) 17 gauge guide needle was advanced into the lateral cortex of the lower pole of the left kidney. Four separate 18 gauge core biopsy were obtained. Specimen was placed in saline for transportation to the lab.   Two Gel-Foam pledgets were infused with a small amount of saline.  Final CT image was acquired.  The patient tolerated the procedure well and remained hemodynamically stable throughout.  No complications were encountered and no significant blood loss was encountered.  FINDINGS: CT images during the case demonstrate placement of the needle tip of trocar guide needle into the lateral cortex of the lower pole of the left kidney.  Images after the case demonstrate small amount of expected fluid adjacent to the biopsy site.  Patient remained hemodynamically stable throughout.  COMPLICATIONS: None  IMPRESSION: Status post CT-guided core biopsy of the left kidney for medical renal purpose. Tissue specimen sent to pathology for complete histopathologic analysis.  Signed,  Charles Montgomery. Loreta Ave, DO  [facility]  [facility]   Electronically Signed   By: Charles Montgomery D.O.   On: 01/02/2015 17:53   Scheduled: . carvedilol  12.5 mg Oral BID WC  . furosemide  160 mg Oral BID  . hydrALAZINE  25 mg Oral 3 times per day  . insulin aspart  0-20 Units Subcutaneous TID WC  . insulin aspart  0-5 Units Subcutaneous QHS  . insulin aspart  10 Units Subcutaneous TID WC  . insulin detemir  35 Units Subcutaneous BID  . isosorbide mononitrate  30 mg Oral Daily  . pneumococcal 23 valent vaccine  0.5 mL Intramuscular Tomorrow-1000  . sodium chloride  3 mL Intravenous Q12H     LOS: 4 days   Charles Montgomery 01/04/2015,1:55 PM  Renal Attending: Agree with note as articulated above and I examined the pt and reviewed and discussed plans with the Resident Physician above. He will need Renal f/u and he will see me in 3-4 weeks.  He needs to see a PCP as well.  I will review biopsy next week and contact him if any surprises are noted. [facility] has not yet received tissue(this AM). Charles Montgomery  Charles Montgomery

## 2015-01-04 NOTE — Progress Notes (Signed)
[facility] PROGRESS NOTE  Blas Riches WJX:[id number] DOB: January 30, 1966 DOA: 12/31/2014 PCP: Georgann Housekeeper, MD  Assessment/Plan:  Acute on CKD stage III- patient has been taking NSAIDs and doxycycline at home. Creatinine went up to 4.37 from baseline of 1.58 on 12/17/2014. Patient was given Lasix yesterday, nephrology has been consulted and workup for the acute kidney injury is underway. Renal ultrasound was normal.  - Nephrology assisting.  CHF - patient had symptoms of dyspnea, currently on lasix 80 mg po bid. Echocardiogram showed EF 25%. VQ scan negative for pulmonary embolism.  Hypertension- . Patient was started on metoprolol 25 g twice a day which was changed to Coreg 6.25 twice a day per cardiology. Continue hydralazine 25 mg by mouth every 8 hours  Diabetes mellitus-  - Continue current regimen - On Levemir 35 U SQ BID  Left lower extremity DVT- patient found to have left lower ext DVT in the peroneal vein. Discussed with Dr Arlean Hopping, patient to undergo  Kidney biopsy in am, will not be able to anticoagulate. Patient is refusing IVC filter. - IR reports that we can start anticoagulation, will consult Pharmacy to assist.   Code Status: Full code Family Communication: No family at bedside Disposition Plan: Home in medically stable   Consultants:  Nephrology  Procedures:  None  Antibiotics:  None  HPI/Subjective: 49 year old male with a history of hypertension, diabetes mellitus came with worsening shortness of breath and swelling of both lower extremities. Patient found to be in acute kidney injury. This morning patient feels good, yesterday had left sided chest pain, V/Q scan was negative.  No new complaints reported.  Objective: Filed Vitals:   01/04/15 1025  BP: 126/87  Pulse: 100  Temp: 97.9 F (36.6 C)  Resp: 18    Intake/Output Summary (Last 24 hours) at 01/04/15 1755 Last data filed at 01/04/15 1431  Gross per 24 hour  Intake   1500 ml   Output   3275 ml  Net  -1775 ml   Filed Weights   01/02/15 0500 01/02/15 2100 01/03/15 2100  Weight: 135.739 kg (299 lb 4 oz) 134.9 kg (297 lb 6.4 oz) 131.997 kg (291 lb)    Exam:   General:  Appears in no acute distress, awake and alert.  Cardiovascular: S1-S2 regular, no rubs  Respiratory: Clear to auscultation bilaterally, no wheezes  Abdomen: Soft, nontender, no organomegaly  Musculoskeletal: No edema of the lower extremities   Data Reviewed: Basic Metabolic Panel:  Recent Labs Lab 01/01/15 0940 01/02/15 0406 01/02/15 2345 01/03/15 0515 01/04/15 0820  NA 134* 131* 128* 131* 132*  K 4.3 5.0 5.3* 4.2 4.1  CL 104 98* 95* 100* 99*  CO2 21* 22 22 21* 22  GLUCOSE 267* 499* 650* 376* 194*  BUN 38* 45* 67* 69* 72*  CREATININE 4.32* 4.46* 5.32* 5.00* 5.21*  CALCIUM 8.4* 9.0 8.1* 8.2* 8.2*   Liver Function Tests:  Recent Labs Lab 12/30/14 2123  AST 40  ALT 30  ALKPHOS 111  BILITOT 0.8  PROT 6.4*  ALBUMIN 1.9*    Recent Labs Lab 12/30/14 2123  LIPASE 61*   CBC:  Recent Labs Lab 12/30/14 2123 12/31/14 0620  WBC 8.4 7.4  HGB 13.2 11.6*  HCT 38.2* 34.4*  MCV 75.6* 75.3*  PLT 267 241   Cardiac Enzymes:  Recent Labs Lab 12/31/14 1854  CKTOTAL 127   BNP (last 3 results)  Recent Labs  12/30/14 2123  BNP 371.8*     CBG:  Recent Labs Lab 01/04/15  1610 01/04/15 0749 01/04/15 1213 01/04/15 1400 01/04/15 1708  GLUCAP 114* 181* 428* 326* 237*    Recent Results (from the past 240 hour(s))  Urine culture     Status: None   Collection Time: 12/31/14  9:52 AM  Result Value Ref Range Status   Specimen Description URINE, CLEAN CATCH  Final   Special Requests Normal  Final   Culture 6,000 COLONIES/mL INSIGNIFICANT GROWTH  Final   Report Status 01/01/2015 FINAL  Final     Studies: No results found.  Scheduled Meds: . carvedilol  12.5 mg Oral BID WC  . furosemide  80 mg Oral BID  . hydrALAZINE  25 mg Oral 3 times per day  .  insulin aspart  0-20 Units Subcutaneous TID WC  . insulin aspart  0-5 Units Subcutaneous QHS  . insulin aspart  10 Units Subcutaneous TID WC  . insulin detemir  35 Units Subcutaneous BID  . isosorbide mononitrate  30 mg Oral Daily  . pneumococcal 23 valent vaccine  0.5 mL Intramuscular Tomorrow-1000  . sodium chloride  3 mL Intravenous Q12H   Continuous Infusions:   Active Problems:   Acute renal failure syndrome   Nephrotic syndrome   Hypoalbuminemia   Peripheral edema   Diabetes mellitus   Hypertension   AKI (acute kidney injury)   AP (abdominal pain)   Proteinuria   Edema   SOB (shortness of breath)   Acute systolic heart failure    Time spent: 30 min    [name]  [facility] Pager [phone]. If 7PM-7AM, please contact night-coverage at [email or web address], password [facility] 01/04/2015, 5:55 PM  LOS: 4 days

## 2015-01-04 NOTE — Progress Notes (Signed)
ANTICOAGULATION CONSULT NOTE - Initial Consult  Pharmacy Consult for heparin>>warfarin Indication: DVT  No Known Allergies  Patient Measurements: Height: 5' 10.87" (180 cm) Weight: 291 lb (131.997 kg) IBW/kg (Calculated) : 74.99 Heparin Dosing Weight: 105kg  Vital Signs: Temp: 97.9 F (36.6 C) (08/17 1025) Temp Source: Oral (08/17 1025) BP: 126/87 mmHg (08/17 1025) Pulse Rate: 100 (08/17 1025)  Labs:  Recent Labs  01/02/15 0959 01/02/15 2345 01/03/15 0515 01/04/15 0820  LABPROT 16.0*  --   --   --   INR 1.26  --   --   --   CREATININE  --  5.32* 5.00* 5.21*    Estimated Creatinine Clearance: 24.2 mL/min (by C-G formula based on Cr of 5.21).   Medical History: Past Medical History  Diagnosis Date  . Diabetes mellitus   . Hypertension   . High cholesterol     Assessment: 49 y.o. male with a history of HTN, DM2, and Hyperlipidemia who presents tot he ED with complaints of worsening SOB and swelling of both lower legs worsening over 1 month.  Patient found to have LLE DVT, negative for PE. Patient also scheduled for kidney bx in am. Anticoagulation has been on hold d/t bx, but IR now ok with starting tonight with heparin>>warfarin.  Patient not on anticoagulation pta, baseline INR 1.26.  Goal of Therapy:  INR 2-3 Heparin level 0.3-0.7 units/ml Monitor platelets by anticoagulation protocol: Yes   Plan:  Give 5000 units bolus x 1 Start heparin infusion at 1650 units/hr units/hr Check anti-Xa level in 6 hours and daily while on heparin Continue to monitor H&H and platelets  Warfarin 10mg  tonight Daily INR  Sheppard Coil PharmD., BCPS Clinical Pharmacist Pager [phone] 01/04/2015 6:09 PM

## 2015-01-04 NOTE — Progress Notes (Signed)
Pt was given the coumadin education booklet and watch the educational video. All questions were answered appropriately. Will f/u.

## 2015-01-05 LAB — BASIC METABOLIC PANEL
ANION GAP: 10 (ref 5–15)
BUN: 75 mg/dL — ABNORMAL HIGH (ref 6–20)
CALCIUM: 8.3 mg/dL — AB (ref 8.9–10.3)
CHLORIDE: 102 mmol/L (ref 101–111)
CO2: 22 mmol/L (ref 22–32)
Creatinine, Ser: 5.17 mg/dL — ABNORMAL HIGH (ref 0.61–1.24)
GFR calc non Af Amer: 12 mL/min — ABNORMAL LOW (ref >60)
GFR, EST AFRICAN AMERICAN: 14 mL/min — AB (ref >60)
GLUCOSE: 265 mg/dL — AB (ref 65–99)
POTASSIUM: 5 mmol/L (ref 3.5–5.1)
Sodium: 134 mmol/L — ABNORMAL LOW (ref 135–145)

## 2015-01-05 LAB — GLUCOSE, CAPILLARY
GLUCOSE-CAPILLARY: 243 mg/dL — AB (ref 65–99)
GLUCOSE-CAPILLARY: 283 mg/dL — AB (ref 65–99)
GLUCOSE-CAPILLARY: 124 mg/dL — AB (ref 65–99)
Glucose-Capillary: 259 mg/dL — ABNORMAL HIGH (ref 65–99)
Glucose-Capillary: 152 mg/dL — ABNORMAL HIGH (ref 65–99)

## 2015-01-05 LAB — CBC
HCT: 35.7 % — ABNORMAL LOW (ref 39.0–52.0)
Hemoglobin: 12.2 g/dL — ABNORMAL LOW (ref 13.0–17.0)
MCH: 25.7 pg — ABNORMAL LOW (ref 26.0–34.0)
MCHC: 34.2 g/dL (ref 30.0–36.0)
MCV: 75.3 fL — AB (ref 78.0–100.0)
PLATELETS: 293 10*3/uL (ref 150–400)
RBC: 4.74 MIL/uL (ref 4.22–5.81)
RDW: 14.2 % (ref 11.5–15.5)
WBC: 7.4 10*3/uL (ref 4.0–10.5)

## 2015-01-05 LAB — HEPARIN LEVEL (UNFRACTIONATED)
HEPARIN UNFRACTIONATED: 0.34 [IU]/mL (ref 0.30–0.70)
Heparin Unfractionated: 0.27 IU/mL — ABNORMAL LOW (ref 0.30–0.70)
Heparin Unfractionated: 0.22 IU/mL — ABNORMAL LOW (ref 0.30–0.70)

## 2015-01-05 LAB — PROTIME-INR
INR: 1.06 (ref 0.00–1.49)
Prothrombin Time: 14 seconds (ref 11.6–15.2)

## 2015-01-05 MED ORDER — PREDNISONE 50 MG PO TABS
60.0000 mg | ORAL_TABLET | Freq: Every day | ORAL | Status: DC
  Administered 2015-01-06 – 2015-01-08 (×3): 60 mg via ORAL
  Filled 2015-01-05 (×6): qty 1

## 2015-01-05 MED ORDER — HEPARIN (PORCINE) IN NACL 100-0.45 UNIT/ML-% IJ SOLN
1900.0000 [IU]/h | INTRAMUSCULAR | Status: DC
  Administered 2015-01-05: 2100 [IU]/h via INTRAVENOUS
  Administered 2015-01-06: 2200 [IU]/h via INTRAVENOUS
  Administered 2015-01-07: 1900 [IU]/h via INTRAVENOUS
  Filled 2015-01-05 (×5): qty 250

## 2015-01-05 MED ORDER — WARFARIN SODIUM 10 MG PO TABS
10.0000 mg | ORAL_TABLET | Freq: Once | ORAL | Status: AC
  Administered 2015-01-05: 10 mg via ORAL
  Filled 2015-01-05: qty 1

## 2015-01-05 MED ORDER — HEPARIN BOLUS VIA INFUSION
1500.0000 [IU] | Freq: Once | INTRAVENOUS | Status: AC
  Administered 2015-01-05: 1500 [IU] via INTRAVENOUS
  Filled 2015-01-05: qty 1500

## 2015-01-05 NOTE — Discharge Instructions (Signed)
Information on my medicine - Coumadin®   (Warfarin) ° °This medication education was reviewed with me or my healthcare representative as part of my discharge preparation.  The pharmacist that spoke with me during my hospital stay was:  Jaimeson Gopal Ann, RPH ° °Why was Coumadin prescribed for you? °Coumadin was prescribed for you because you have a blood clot or a medical condition that can cause an increased risk of forming blood clots. Blood clots can cause serious health problems by blocking the flow of blood to the heart, lung, or brain. Coumadin can prevent harmful blood clots from forming. °As a reminder your indication for Coumadin is:   Deep Vein Thrombosis Treatment ° °What test will check on my response to Coumadin? °While on Coumadin (warfarin) you will need to have an INR test regularly to ensure that your dose is keeping you in the desired range. The INR (international normalized ratio) number is calculated from the result of the laboratory test called prothrombin time (PT). ° °If an INR APPOINTMENT HAS NOT ALREADY BEEN MADE FOR YOU please schedule an appointment to have this lab work done by your health care provider within 7 days. °Your INR goal is usually a number between:  2 to 3 or your provider may give you a more narrow range like 2-2.5.  Ask your health care provider during an office visit what your goal INR is. ° °What  do you need to  know  About  COUMADIN? °Take Coumadin (warfarin) exactly as prescribed by your healthcare provider about the same time each day.  DO NOT stop taking without talking to the doctor who prescribed the medication.  Stopping without other blood clot prevention medication to take the place of Coumadin may increase your risk of developing a new clot or stroke.  Get refills before you run out. ° °What do you do if you miss a dose? °If you miss a dose, take it as soon as you remember on the same day then continue your regularly scheduled regimen the next day.  Do not  take two doses of Coumadin at the same time. ° °Important Safety Information °A possible side effect of Coumadin (Warfarin) is an increased risk of bleeding. You should call your healthcare provider right away if you experience any of the following: °  Bleeding from an injury or your nose that does not stop. °  Unusual colored urine (red or dark brown) or unusual colored stools (red or black). °  Unusual bruising for unknown reasons. °  A serious fall or if you hit your head (even if there is no bleeding). ° °Some foods or medicines interact with Coumadin® (warfarin) and might alter your response to warfarin. To help avoid this: °  Eat a balanced diet, maintaining a consistent amount of Vitamin K. °  Notify your provider about major diet changes you plan to make. °  Avoid alcohol or limit your intake to 1 drink for women and 2 drinks for men per day. °(1 drink is 5 oz. wine, 12 oz. beer, or 1.5 oz. liquor.) ° °Make sure that ANY health care provider who prescribes medication for you knows that you are taking Coumadin (warfarin).  Also make sure the healthcare provider who is monitoring your Coumadin knows when you have started a new medication including herbals and non-prescription products. ° °Coumadin® (Warfarin)  Major Drug Interactions  °Increased Warfarin Effect Decreased Warfarin Effect  °Alcohol (large quantities) °Antibiotics (esp. Septra/Bactrim, Flagyl, Cipro) °Amiodarone (Cordarone) °Aspirin (ASA) °Cimetidine (  Tagamet) °Megestrol (Megace) °NSAIDs (ibuprofen, naproxen, etc.) °Piroxicam (Feldene) °Propafenone (Rythmol SR) °Propranolol (Inderal) °Isoniazid (INH) °Posaconazole (Noxafil) Barbiturates (Phenobarbital) °Carbamazepine (Tegretol) °Chlordiazepoxide (Librium) °Cholestyramine (Questran) °Griseofulvin °Oral Contraceptives °Rifampin °Sucralfate (Carafate) °Vitamin K  ° °Coumadin® (Warfarin) Major Herbal Interactions  °Increased Warfarin Effect Decreased Warfarin Effect  °Garlic °Ginseng °Ginkgo biloba  Coenzyme Q10 °Green tea °St. John’s wort   ° °Coumadin® (Warfarin) FOOD Interactions  °Eat a consistent number of servings per week of foods HIGH in Vitamin K °(1 serving = ½ cup)  °Collards (cooked, or boiled & drained) °Kale (cooked, or boiled & drained) °Mustard greens (cooked, or boiled & drained) °Parsley *serving size only = ¼ cup °Spinach (cooked, or boiled & drained) °Swiss chard (cooked, or boiled & drained) °Turnip greens (cooked, or boiled & drained)  °Eat a consistent number of servings per week of foods MEDIUM-HIGH in Vitamin K °(1 serving = 1 cup)  °Asparagus (cooked, or boiled & drained) °Broccoli (cooked, boiled & drained, or raw & chopped) °Brussel sprouts (cooked, or boiled & drained) *serving size only = ½ cup °Lettuce, raw (green leaf, endive, romaine) °Spinach, raw °Turnip greens, raw & chopped  ° °These websites have more information on Coumadin (warfarin):  www.coumadin.com; °www.ahrq.gov/consumer/coumadin.htm; ° °  °

## 2015-01-05 NOTE — Progress Notes (Signed)
[facility] PROGRESS NOTE  Charles Montgomery JHE:[id number] DOB: 1965/08/13 DOA: 12/31/2014 PCP: Georgann Housekeeper, MD  Assessment/Plan:  Acute on CKD stage III- patient has been taking NSAIDs and doxycycline at home. Creatinine went up to 4.37 from baseline of 1.58 on 12/17/2014. Patient was given Lasix yesterday, nephrology has been consulted and workup for the acute kidney injury is underway. Renal ultrasound was normal.  - Nephrology assisting.  CHF  - currently on lasix 80 mg po bid. Echocardiogram showed EF 25%. VQ scan negative for pulmonary embolism.  Hypertension - Not well controlled currently Coreg recently increased, continue imdur  Diabetes mellitus-  - Continue current regimen - On Levemir 35 U SQ BID  Left lower extremity DVT- patient found to have left lower ext DVT in the peroneal vein.  Patient is refusing IVC filter. Discussed with interventional radiology who felt patient was okay to start anticoagulation 2 days out. -Consulted Pharmacy to assist with anticoagulation recommendations area and we'll place on heparin with bridging to Coumadin   Code Status: Full code Family Communication: No family at bedside Disposition Plan: Home in medically stable   Consultants:  Nephrology  Vascular  Cardiology  Procedures:  None  Antibiotics:  None  HPI/Subjective: 49 year old male with a history of hypertension, diabetes mellitus came with worsening shortness of breath and swelling of both lower extremities. Patient found to be in acute kidney injury. This morning patient feels good, yesterday had left sided chest pain, V/Q scan was negative.  No new complaints reported.  Objective: Filed Vitals:   01/05/15 0932  BP: 173/50  Pulse: 58  Temp: 98 F (36.7 C)  Resp: 18    Intake/Output Summary (Last 24 hours) at 01/05/15 1254 Last data filed at 01/05/15 0933  Gross per 24 hour  Intake   1680 ml  Output   3600 ml  Net  -1920 ml   Filed Weights    01/03/15 2100 01/04/15 1800 01/05/15 0500  Weight: 131.997 kg (291 lb) 131.997 kg (291 lb) 131.997 kg (291 lb)    Exam:   General:  Appears in no acute distress, awake and alert.  Cardiovascular: S1-S2 regular, no rubs  Respiratory: Clear to auscultation bilaterally, no wheezes  Abdomen: Soft, nontender, no organomegaly  Musculoskeletal: No edema of the lower extremities   Data Reviewed: Basic Metabolic Panel:  Recent Labs Lab 01/02/15 0406 01/02/15 2345 01/03/15 0515 01/04/15 0820 01/05/15 0053  NA 131* 128* 131* 132* 134*  K 5.0 5.3* 4.2 4.1 5.0  CL 98* 95* 100* 99* 102  CO2 22 22 21* 22 22  GLUCOSE 499* 650* 376* 194* 265*  BUN 45* 67* 69* 72* 75*  CREATININE 4.46* 5.32* 5.00* 5.21* 5.17*  CALCIUM 9.0 8.1* 8.2* 8.2* 8.3*   Liver Function Tests:  Recent Labs Lab 12/30/14 2123  AST 40  ALT 30  ALKPHOS 111  BILITOT 0.8  PROT 6.4*  ALBUMIN 1.9*    Recent Labs Lab 12/30/14 2123  LIPASE 61*   CBC:  Recent Labs Lab 12/30/14 2123 12/31/14 0620 01/05/15 0053  WBC 8.4 7.4 7.4  HGB 13.2 11.6* 12.2*  HCT 38.2* 34.4* 35.7*  MCV 75.6* 75.3* 75.3*  PLT 267 241 293   Cardiac Enzymes:  Recent Labs Lab 12/31/14 1854  CKTOTAL 127   BNP (last 3 results)  Recent Labs  12/30/14 2123  BNP 371.8*     CBG:  Recent Labs Lab 01/04/15 1400 01/04/15 1708 01/04/15 2126 01/05/15 0740 01/05/15 1131  GLUCAP 326* 237* 243* 259* 152*  Recent Results (from the past 240 hour(s))  Urine culture     Status: None   Collection Time: 12/31/14  9:52 AM  Result Value Ref Range Status   Specimen Description URINE, CLEAN CATCH  Final   Special Requests Normal  Final   Culture 6,000 COLONIES/mL INSIGNIFICANT GROWTH  Final   Report Status 01/01/2015 FINAL  Final     Studies: No results found.  Scheduled Meds: . carvedilol  12.5 mg Oral BID WC  . furosemide  80 mg Oral BID  . hydrALAZINE  25 mg Oral 3 times per day  . insulin aspart  0-20 Units  Subcutaneous TID WC  . insulin aspart  0-5 Units Subcutaneous QHS  . insulin aspart  10 Units Subcutaneous TID WC  . insulin detemir  35 Units Subcutaneous BID  . isosorbide mononitrate  30 mg Oral Daily  . pneumococcal 23 valent vaccine  0.5 mL Intramuscular Tomorrow-1000  . sodium chloride  3 mL Intravenous Q12H  . warfarin  10 mg Oral ONCE-1800  . Warfarin - Pharmacist Dosing Inpatient   Does not apply q1800   Continuous Infusions: . heparin 2,100 Units/hr (01/05/15 1201)    Active Problems:   Acute renal failure syndrome   Nephrotic syndrome   Hypoalbuminemia   Peripheral edema   Diabetes mellitus   Hypertension   AKI (acute kidney injury)   AP (abdominal pain)   Proteinuria   Edema   SOB (shortness of breath)   Acute systolic heart failure    Time spent: 30 min    [name]  [facility] Pager [phone]. If 7PM-7AM, please contact night-coverage at [email or web address], password [facility] 01/05/2015, 12:54 PM  LOS: 5 days

## 2015-01-05 NOTE — Progress Notes (Signed)
ANTICOAGULATION CONSULT NOTE - Follow Up Consult  Pharmacy Consult for Heparin + Warfarin  Indication: New LLE DVT  No Known Allergies  Patient Measurements: Height: 5' 10.87" (180 cm) Weight: 291 lb (131.997 kg) IBW/kg (Calculated) : 74.99 Heparin Dosing Weight: 105 kg  Vital Signs: Temp: 97.8 F (36.6 C) (08/18 1805) Temp Source: Oral (08/18 1805) BP: 150/91 mmHg (08/18 1805) Pulse Rate: 99 (08/18 1805)  Labs:  Recent Labs  01/03/15 0515 01/04/15 0820 01/05/15 0053 01/05/15 0930 01/05/15 1818  HGB  --   --  12.2*  --   --   HCT  --   --  35.7*  --   --   PLT  --   --  293  --   --   LABPROT  --   --  14.0  --   --   INR  --   --  1.06  --   --   HEPARINUNFRC  --   --  0.27* 0.22* 0.34  CREATININE 5.00* 5.21* 5.17*  --   --     Estimated Creatinine Clearance: 24.4 mL/min (by C-G formula based on Cr of 5.17).   Assessment: 48 YOM started on Heparin + Warfarin on 8/17 for new LLE DVT found via dopplers on 8/14. Anticoagulation was held initially due to need for a renal biopsy. Heparin level this AM is SUBtherapeutic (HL 0.22 << 0.27, goal of 0.3-0.7). The patient was noted to lose IV access after the phlebotomist drew blood for the level - so it can be assumed that the measured level was accurate. A new access is being placed currently. INR remains SUBtherapeutic as expected after 1 dose (INR 1.06, goal of 2-3). CBC okay - no overt s/sx of bleeding noted  Today is VTE overlap D#2/5 as recommended per CHEST guidelines however best practice is to continue bridging until INR >2 for 24 hours (so >5 days may be necessary in some instances). The patient was educated on warfarin today.  HL therapeutic 0.34 tonight @ 2100 units/h. Will check 8h HL to confirm. No bleed issues documented  Goal of Therapy:  INR 2-3 Heparin level 0.3-0.7 units/ml Monitor platelets by anticoagulation protocol: Yes   Plan:  -Continue heparin @ 2100 units/h -8h HL (confirm) -Daily  HL/CBC -Warfarin 10 mg x 1 dose at 1800 today -Will continue to monitor for any signs/symptoms of bleeding  -Will continue to monitor for any signs/symptoms of bleeding and will follow up with PT/INR in the a.m.   Babs Bertin, PharmD Clinical Pharmacist Pager [phone] 01/05/2015 7:06 PM

## 2015-01-05 NOTE — Progress Notes (Signed)
ANTICOAGULATION CONSULT NOTE - Follow Up Consult  Pharmacy Consult for heparin>>warfarin Indication: DVT  No Known Allergies  Patient Measurements: Height: 5' 10.87" (180 cm) Weight: 291 lb (131.997 kg) IBW/kg (Calculated) : 74.99 Heparin Dosing Weight: 105kg  Vital Signs: Temp: 98.6 F (37 C) (08/17 2100) Temp Source: Oral (08/17 2100) BP: 142/82 mmHg (08/17 2100) Pulse Rate: 103 (08/17 2100)  Labs:  Recent Labs  01/02/15 0959  01/03/15 0515 01/04/15 0820 01/05/15 0053  HGB  --   --   --   --  12.2*  HCT  --   --   --   --  35.7*  PLT  --   --   --   --  293  LABPROT 16.0*  --   --   --  14.0  INR 1.26  --   --   --  1.06  HEPARINUNFRC  --   --   --   --  0.27*  CREATININE  --   < > 5.00* 5.21* 5.17*  < > = values in this interval not displayed.  Estimated Creatinine Clearance: 24.4 mL/min (by C-G formula based on Cr of 5.17).   Medical History: Past Medical History  Diagnosis Date  . Diabetes mellitus   . Hypertension   . High cholesterol     Assessment: 49 y.o. male with a history of HTN, DM2, and Hyperlipidemia who presents tot he ED with complaints of worsening SOB and swelling of both lower legs worsening over 1 month.  Patient found to have LLE DVT, negative for PE. Patient also scheduled for kidney bx in am. Anticoagulation has been on hold d/t bx, but IR now ok with starting tonight with heparin>>warfarin.   Patient not on anticoagulation pta, baseline INR 1.26.  F/u HL is sub-therapeutic at 0.27. RN reports no s/s of bleeding   Goal of Therapy:  INR 2-3 Heparin level 0.3-0.7 units/ml Monitor platelets by anticoagulation protocol: Yes   Plan:  Give heparin 1500 units bolus Increase heparin infusion to 1850 units/hr units/hr Check anti-Xa level in 6 hours and daily while on heparin Continue to monitor H&H and platelets    Vinnie Level, PharmD., BCPS Clinical Pharmacist Pager [phone]

## 2015-01-05 NOTE — Progress Notes (Signed)
Assessment:  49 yo male with DM II, HTN, no health care contact in a long time, here with CHF, AKI, and LLE DVT.  1. Acute on CKD 3 - b/l SCr 1.2 on 07/2014. Had active sediment w rbc/wbc casts and heavy proteinuria (13 gm by UPC ratio). 24 hr urine 809 protein. AKI likely from cardionreal, but also could be from heavy NSAID use.  S/p Bx 8/15 to r/o post-infectious GN (just completed abx for small axilla abcess) or other acute/ rapidly progressive GN.  Results pending for bx,serologies neg. 2. CKD likely from diabetes/HTN  3. 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 Card plans no LHC due to renal insufficiency. Diuresing well on lasix.  4. DM x 20 years  5. HTN - new onset, controlled currently on coreg 6.25mg  bid, lasix 160 po BID, hydral 25 q8hr and imdur 24hr tab 30 daily.  5. Obesity - had discussion about weight loss strategies. Agreed to decreasing quantity of food and exercising.  6    FH of ESRD(father) 7. LLE DVT - was off anticoag currently due to recent kidney bx, now on coumadin+heparin bridge.   Plan: 1. Avoid NSAIDs and other nephrotoxins. F/up bx results. 2. Diuresing well on current lasix dose. Continue Lasix 80mg  po BID, this may be his home dose.  4. Follow up with Dr. Grant Fontana outpatient in 3-4 weeks. Also needs PCP follow up. Renal Attending: I have reviewed this patient with the Resident Physician, examined pt and took a history.  I agree with the plan and evaluation outlined above. Nehemyah Foushee C  Subjective: Interval History: feeling better, tried to do pushup yesterday without much success. SOB is better.   Objective: Vital signs in last 24 hours: Temp:  [98 F (36.7 C)-98.6 F (37 C)] 98 F (36.7 C) (08/18 0932) Pulse Rate:  [58-110] 58 (08/18 0932) Resp:  [18] 18 (08/18 0932) BP: (133-173)/(50-88) 173/50 mmHg (08/18 0932) SpO2:  [98 %-100 %] 100 % (08/18 0932) Weight:  [291 lb (131.997 kg)] 291 lb (131.997 kg) (08/18 0500) Weight  change: 0 lb (0 kg)  Intake/Output from previous day: 08/17 0701 - 08/18 0700 In: 1680 [P.O.:1680] Out: 3775 [Urine:3775] Intake/Output this shift: Total I/O In: 240 [P.O.:240] Out: 425 [Urine:425]  General appearance: alert and cooperative Resp: clear to auscultation bilaterally Chest wall: no tenderness Cardio: regular rate and rhythm, S1, S2 normal, no murmur, click, rub or gallop Extremities: edema 1+ b/l LE's. Improving. L>R.   Lab Results:  Recent Labs  01/05/15 0053  WBC 7.4  HGB 12.2*  HCT 35.7*  PLT 293   BMET:   Recent Labs  01/04/15 0820 01/05/15 0053  NA 132* 134*  K 4.1 5.0  CL 99* 102  CO2 22 22  GLUCOSE 194* 265*  BUN 72* 75*  CREATININE 5.21* 5.17*  CALCIUM 8.2* 8.3*   No results for input(s): PTH in the last 72 hours. Iron Studies: No results for input(s): IRON, TIBC, TRANSFERRIN, FERRITIN in the last 72 hours. Studies/Results: No results found. Scheduled: . carvedilol  12.5 mg Oral BID WC  . furosemide  80 mg Oral BID  . hydrALAZINE  25 mg Oral 3 times per day  . insulin aspart  0-20 Units Subcutaneous TID WC  . insulin aspart  0-5 Units Subcutaneous QHS  . insulin aspart  10 Units Subcutaneous TID WC  . insulin detemir  35 Units Subcutaneous BID  . isosorbide mononitrate  30 mg Oral Daily  .  pneumococcal 23 valent vaccine  0.5 mL Intramuscular Tomorrow-1000  . sodium chloride  3 mL Intravenous Q12H  . warfarin  10 mg Oral ONCE-1800  . Warfarin - Pharmacist Dosing Inpatient   Does not apply q1800     LOS: 5 days   [name], [name] 01/05/2015,11:41 AM

## 2015-01-05 NOTE — Progress Notes (Signed)
ANTICOAGULATION CONSULT NOTE - Follow Up Consult  Pharmacy Consult for Heparin + Warfarin  Indication: New LLE DVT  No Known Allergies  Patient Measurements: Height: 5' 10.87" (180 cm) Weight: 291 lb (131.997 kg) IBW/kg (Calculated) : 74.99 Heparin Dosing Weight: 105 kg  Vital Signs: Temp: 98.4 F (36.9 C) (08/18 0500) Temp Source: Oral (08/18 0500) BP: 133/88 mmHg (08/18 0500) Pulse Rate: 98 (08/18 0500)  Labs:  Recent Labs  01/02/15 0959  01/03/15 0515 01/04/15 0820 01/05/15 0053  HGB  --   --   --   --  12.2*  HCT  --   --   --   --  35.7*  PLT  --   --   --   --  293  LABPROT 16.0*  --   --   --  14.0  INR 1.26  --   --   --  1.06  HEPARINUNFRC  --   --   --   --  0.27*  CREATININE  --   < > 5.00* 5.21* 5.17*  < > = values in this interval not displayed.  Estimated Creatinine Clearance: 24.4 mL/min (by C-G formula based on Cr of 5.17).   Assessment: 48 YOM started on Heparin + Warfarin on 8/17 for new LLE DVT found via dopplers on 8/14. Anticoagulation was held initially due to need for a renal biopsy. Heparin level this AM is SUBtherapeutic (HL 0.22 << 0.27, goal of 0.3-0.7). The patient was noted to lose IV access after the phlebotomist drew blood for the level - so it can be assumed that the measured level was accurate. A new access is being placed currently. INR remains SUBtherapeutic as expected after 1 dose (INR 1.06, goal of 2-3). CBC okay - no overt s/sx of bleeding noted  Today is VTE overlap D#2/5 as recommended per CHEST guidelines however best practice is to continue bridging until INR >2 for 24 hours (so >5 days may be necessary in some instances). The patient was educated on warfarin today.  Goal of Therapy:  INR 2-3 Heparin level 0.3-0.7 units/ml Monitor platelets by anticoagulation protocol: Yes   Plan:  1. Once access re-established, increase the heparin drip to 2100 units/hr (21 ml/hr) 2. Warfarin 10 mg x 1 dose at 1800 today 3. Will continue  to monitor for any signs/symptoms of bleeding and will follow up with heparin level in 6 hours after drip resumed 4. Will continue to monitor for any signs/symptoms of bleeding and will follow up with PT/INR in the a.m.   Georgina Pillion, PharmD, BCPS Clinical Pharmacist Pager: [phone] 01/05/2015 8:48 AM

## 2015-01-05 NOTE — Progress Notes (Signed)
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  Charles Montgomery C

## 2015-01-06 DIAGNOSIS — I82409 Acute embolism and thrombosis of unspecified deep veins of unspecified lower extremity: Secondary | ICD-10-CM

## 2015-01-06 LAB — GLUCOSE, CAPILLARY
GLUCOSE-CAPILLARY: 232 mg/dL — AB (ref 65–99)
GLUCOSE-CAPILLARY: 465 mg/dL — AB (ref 65–99)
GLUCOSE-CAPILLARY: 469 mg/dL — AB (ref 65–99)
Glucose-Capillary: 361 mg/dL — ABNORMAL HIGH (ref 65–99)
Glucose-Capillary: 379 mg/dL — ABNORMAL HIGH (ref 65–99)

## 2015-01-06 LAB — CBC
HEMATOCRIT: 35.3 % — AB (ref 39.0–52.0)
HEMOGLOBIN: 11.9 g/dL — AB (ref 13.0–17.0)
MCH: 25.6 pg — ABNORMAL LOW (ref 26.0–34.0)
MCHC: 33.7 g/dL (ref 30.0–36.0)
MCV: 76.1 fL — ABNORMAL LOW (ref 78.0–100.0)
Platelets: 257 10*3/uL (ref 150–400)
RBC: 4.64 MIL/uL (ref 4.22–5.81)
RDW: 14.5 % (ref 11.5–15.5)
WBC: 6.4 10*3/uL (ref 4.0–10.5)

## 2015-01-06 LAB — BASIC METABOLIC PANEL
ANION GAP: 12 (ref 5–15)
BUN: 64 mg/dL — ABNORMAL HIGH (ref 6–20)
CALCIUM: 8.3 mg/dL — AB (ref 8.9–10.3)
CO2: 21 mmol/L — ABNORMAL LOW (ref 22–32)
Chloride: 101 mmol/L (ref 101–111)
Creatinine, Ser: 4.8 mg/dL — ABNORMAL HIGH (ref 0.61–1.24)
GFR, EST AFRICAN AMERICAN: 15 mL/min — AB (ref >60)
GFR, EST NON AFRICAN AMERICAN: 13 mL/min — AB (ref >60)
GLUCOSE: 270 mg/dL — AB (ref 65–99)
POTASSIUM: 4.1 mmol/L (ref 3.5–5.1)
SODIUM: 134 mmol/L — AB (ref 135–145)

## 2015-01-06 LAB — CRYOGLOBULIN

## 2015-01-06 LAB — PROTIME-INR
INR: 1.1 (ref 0.00–1.49)
PROTHROMBIN TIME: 14.4 s (ref 11.6–15.2)

## 2015-01-06 LAB — HEPARIN LEVEL (UNFRACTIONATED): HEPARIN UNFRACTIONATED: 0.31 [IU]/mL (ref 0.30–0.70)

## 2015-01-06 MED ORDER — NICOTINE 21 MG/24HR TD PT24
21.0000 mg | MEDICATED_PATCH | Freq: Every day | TRANSDERMAL | Status: DC
  Administered 2015-01-06 – 2015-01-08 (×3): 21 mg via TRANSDERMAL
  Filled 2015-01-06 (×3): qty 1

## 2015-01-06 MED ORDER — FUROSEMIDE 40 MG PO TABS
40.0000 mg | ORAL_TABLET | Freq: Two times a day (BID) | ORAL | Status: DC
  Administered 2015-01-06 – 2015-01-08 (×4): 40 mg via ORAL
  Filled 2015-01-06 (×4): qty 1

## 2015-01-06 MED ORDER — WARFARIN SODIUM 7.5 MG PO TABS
15.0000 mg | ORAL_TABLET | Freq: Once | ORAL | Status: AC
  Administered 2015-01-06: 15 mg via ORAL
  Filled 2015-01-06: qty 2

## 2015-01-06 NOTE — Progress Notes (Signed)
[facility] PROGRESS NOTE  Charles Montgomery XBJ:[id number] DOB: 1966/01/28 DOA: 12/31/2014 PCP: Charles Housekeeper, MD  Assessment/Plan:  Acute on CKD stage III- patient has been taking NSAIDs and doxycycline at home. Creatinine went up to 4.37 from baseline of 1.58 on 12/17/2014. Patient was given Lasix yesterday, nephrology has been consulted and workup for the acute kidney injury is underway. Renal ultrasound was normal.  - Nephrology managing. Bx showed Collapsing FSGS patient started on 60 mg of prednisone daily.  CHF  - currently on lasix 80 mg po bid. Echocardiogram showed EF 25%. VQ scan negative for pulmonary embolism.  Hypertension - Not well controlled currently Coreg recently increased, continue imdur  Diabetes mellitus-  - Continue current regimen - On Levemir 35 U SQ BID  Left lower extremity DVT- patient found to have left lower ext DVT in the peroneal vein.  Patient is refusing IVC filter. Discussed with interventional radiology who felt patient was okay to start anticoagulation 2 days out. - heparin with bridging to Coumadin.  - INR 1.1   Code Status: Full code Family Communication: No family at bedside Disposition Plan: Home Once INR within target range   Consultants:  Nephrology  Vascular  Cardiology  Procedures:  None  Antibiotics:  None  HPI/Subjective: 49 year old male with a history of hypertension, diabetes mellitus came with worsening shortness of breath and swelling of both lower extremities. Patient found to be in acute kidney injury. This morning patient feels good, yesterday had left sided chest pain, V/Q scan was negative.  No new complaints reported today.  Objective: Filed Vitals:   01/06/15 0928  BP: 129/85  Pulse: 103  Temp: 97.8 F (36.6 C)  Resp: 14    Intake/Output Summary (Last 24 hours) at 01/06/15 1619 Last data filed at 01/06/15 0900  Gross per 24 hour  Intake 1778.51 ml  Output   3850 ml  Net -2071.49 ml    Filed Weights   01/04/15 1800 01/05/15 0500 01/05/15 2009  Weight: 131.997 kg (291 lb) 131.997 kg (291 lb) 131.867 kg (290 lb 11.4 oz)    Exam:   General:  Appears in no acute distress, awake and alert.  Cardiovascular: S1-S2 regular, no rubs  Respiratory: Clear to auscultation bilaterally, no wheezes  Abdomen: Soft, nontender, no organomegaly  Musculoskeletal: No edema of the lower extremities   Data Reviewed: Basic Metabolic Panel:  Recent Labs Lab 01/02/15 2345 01/03/15 0515 01/04/15 0820 01/05/15 0053 01/06/15 0429  NA 128* 131* 132* 134* 134*  K 5.3* 4.2 4.1 5.0 4.1  CL 95* 100* 99* 102 101  CO2 22 21* 22 22 21*  GLUCOSE 650* 376* 194* 265* 270*  BUN 67* 69* 72* 75* 64*  CREATININE 5.32* 5.00* 5.21* 5.17* 4.80*  CALCIUM 8.1* 8.2* 8.2* 8.3* 8.3*   Liver Function Tests:  Recent Labs Lab 12/30/14 2123  AST 40  ALT 30  ALKPHOS 111  BILITOT 0.8  PROT 6.4*  ALBUMIN 1.9*    Recent Labs Lab 12/30/14 2123  LIPASE 61*   CBC:  Recent Labs Lab 12/30/14 2123 12/31/14 0620 01/05/15 0053 01/06/15 0429  WBC 8.4 7.4 7.4 6.4  HGB 13.2 11.6* 12.2* 11.9*  HCT 38.2* 34.4* 35.7* 35.3*  MCV 75.6* 75.3* 75.3* 76.1*  PLT 267 241 293 257   Cardiac Enzymes:  Recent Labs Lab 12/31/14 1854  CKTOTAL 127   BNP (last 3 results)  Recent Labs  12/30/14 2123  BNP 371.8*     CBG:  Recent Labs Lab 01/05/15 1131 01/05/15  1706 01/05/15 2132 01/06/15 0740 01/06/15 1148  GLUCAP 152* 283* 124* 232* 361*    Recent Results (from the past 240 hour(s))  Urine culture     Status: None   Collection Time: 12/31/14  9:52 AM  Result Value Ref Range Status   Specimen Description URINE, CLEAN CATCH  Final   Special Requests Normal  Final   Culture 6,000 COLONIES/mL INSIGNIFICANT GROWTH  Final   Report Status 01/01/2015 FINAL  Final     Studies: No results found.  Scheduled Meds: . carvedilol  12.5 mg Oral BID WC  . furosemide  40 mg Oral BID  .  hydrALAZINE  25 mg Oral 3 times per day  . insulin aspart  0-20 Units Subcutaneous TID WC  . insulin aspart  0-5 Units Subcutaneous QHS  . insulin aspart  10 Units Subcutaneous TID WC  . insulin detemir  35 Units Subcutaneous BID  . isosorbide mononitrate  30 mg Oral Daily  . pneumococcal 23 valent vaccine  0.5 mL Intramuscular Tomorrow-1000  . predniSONE  60 mg Oral Q breakfast  . sodium chloride  3 mL Intravenous Q12H  . warfarin  15 mg Oral ONCE-1800  . Warfarin - Pharmacist Dosing Inpatient   Does not apply q1800   Continuous Infusions: . heparin 2,200 Units/hr (01/06/15 1011)    Active Problems:   Acute renal failure syndrome   Nephrotic syndrome   Hypoalbuminemia   Peripheral edema   Diabetes mellitus   Hypertension   AKI (acute kidney injury)   AP (abdominal pain)   Proteinuria   Edema   SOB (shortness of breath)   Acute systolic heart failure    Time spent: 30 min    [name]  [facility] Pager [phone]. If 7PM-7AM, please contact night-coverage at [email or web address], password [facility] 01/06/2015, 4:19 PM  LOS: 6 days

## 2015-01-06 NOTE — Progress Notes (Signed)
ANTICOAGULATION CONSULT NOTE - Follow Up Consult  Pharmacy Consult for Heparin + warfarin Indication: New LLE DVT  No Known Allergies  Patient Measurements: Height: 5' 10.87" (180 cm) Weight: 290 lb 11.4 oz (131.867 kg) IBW/kg (Calculated) : 74.99 Heparin Dosing Weight: 105 kg  Vital Signs: Temp: 98.2 F (36.8 C) (08/19 0610) Temp Source: Oral (08/19 0610) BP: 124/74 mmHg (08/19 0610) Pulse Rate: 92 (08/19 0610)  Labs:  Recent Labs  01/04/15 0820  01/05/15 0053 01/05/15 0930 01/05/15 1818 01/06/15 0429  HGB  --   --  12.2*  --   --  11.9*  HCT  --   --  35.7*  --   --  35.3*  PLT  --   --  293  --   --  257  LABPROT  --   --  14.0  --   --  14.4  INR  --   --  1.06  --   --  1.10  HEPARINUNFRC  --   < > 0.27* 0.22* 0.34 0.31  CREATININE 5.21*  --  5.17*  --   --  4.80*  < > = values in this interval not displayed.  Estimated Creatinine Clearance: 26.3 mL/min (by C-G formula based on Cr of 4.8).   Assessment: 48 YOM started on Heparin + Warfarin on 8/17 for new LLE DVT found via dopplers on 8/14. Anticoagulation was held initially due to need for a renal biopsy. Confirmatory HL this am was therapeutic on the lower end at 0.31. INR subtherapeutic at 1.10 after two doses of 10mg . Hgb low but stable at 11.9, plts wnl. No s/s of bleed  Today is VTE overlap D#3/5 as recommended per CHEST guidelines however best practice is to continue bridging until INR >2 for 24 hours (so >5 days may be necessary in some instances).   Goal of Therapy:  INR 2-3 Monitor platelets by anticoagulation protocol: Yes   Plan:  Increase heparin drip slightly to 2200 units/hr (22 ml/hr) Give warfarin 15 mg PO x 1 dose at 1800 today Monitor daily HL, INR, CBC, s/s of bleed  Samarah Hogle J 01/06/2015,8:13 AM

## 2015-01-06 NOTE — Progress Notes (Signed)
Assessment:  49 yo male with DM II, HTN, no health care contact in a long time, here with CHF, AKI, and LLE DVT.  1. Acute on CKD 3 - b/l SCr 1.2 on 07/2014. Had active sediment w rbc/wbc casts and heavy proteinuria (13 gm by UPC ratio).  BX showed collapsing FSGS. HIV negative. Other serologies negative. Started prednisone 60mg  daily.  2. CKD likely from diabetes/HTN  3. 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 Card plans no LHC due to renal insufficiency. Diuresing well on lasix.  4. DM x 20 years - needs tight control as this can worsen in the setting of steroid.  5. HTN - new diagnosis- controlled currently on coreg 6.25mg  bid, lasix 160 po BID, hydral 25 q8hr and imdur 24hr tab 30 daily.  5. Obesity - had discussion about weight loss strategies. Agreed to decreasing quantity of food and exercising.  6    FH of ESRD(father) 7. LLE DVT - was off anticoag currently due to recent kidney bx, now on coumadin+heparin bridge.   Plan: 1. Needs long term steroid for his collapsing FSGS. Continue prednisone 60mg  daily for at least 16 weeks.  Avoid NSAIDs and other nephrotoxins. 2. Diuresing well on current lasix dose. Reduce Lasix to 40mg  po BID, this may be his home dose.  3. Discussed importance of contolling his Blood sugar strictly as this may worsen in the setting of steroid.  4. Follow up with Dr. Grant Fontana outpatient in 3-4 weeks. Also needs PCP follow up.   Subjective: Interval History: feeling better, denies SOB, cp.  Objective: Vital signs in last 24 hours: Temp:  [97.8 F (36.6 C)-98.2 F (36.8 C)] 97.8 F (36.6 C) (08/19 0928) Pulse Rate:  [92-103] 103 (08/19 0928) Resp:  [14-18] 14 (08/19 0928) BP: (124-150)/(74-92) 129/85 mmHg (08/19 0928) SpO2:  [94 %-100 %] 100 % (08/19 0928) Weight:  [290 lb 11.4 oz (131.867 kg)] 290 lb 11.4 oz (131.867 kg) (08/18 2009) Weight change: -4.6 oz (-0.13 kg)  Intake/Output from previous day: 08/18 0701 - 08/19 0700 In:  1818.5 [P.O.:1180; I.V.:638.5] Out: 3775 [Urine:3775] Intake/Output this shift: Total I/O In: 320 [P.O.:320] Out: 950 [Urine:950]  General appearance: alert and cooperative Resp: clear to auscultation bilaterally Chest wall: no tenderness Cardio: regular rate and rhythm, S1, S2 normal, no murmur, click, rub or gallop Extremities: edema 1+ b/l LE's. Improving. L>R.   Lab Results:  Recent Labs  01/05/15 0053 01/06/15 0429  WBC 7.4 6.4  HGB 12.2* 11.9*  HCT 35.7* 35.3*  PLT 293 257   BMET:   Recent Labs  01/05/15 0053 01/06/15 0429  NA 134* 134*  K 5.0 4.1  CL 102 101  CO2 22 21*  GLUCOSE 265* 270*  BUN 75* 64*  CREATININE 5.17* 4.80*  CALCIUM 8.3* 8.3*   No results for input(s): PTH in the last 72 hours. Iron Studies: No results for input(s): IRON, TIBC, TRANSFERRIN, FERRITIN in the last 72 hours. Studies/Results: No results found. Scheduled: . carvedilol  12.5 mg Oral BID WC  . furosemide  80 mg Oral BID  . hydrALAZINE  25 mg Oral 3 times per day  . insulin aspart  0-20 Units Subcutaneous TID WC  . insulin aspart  0-5 Units Subcutaneous QHS  . insulin aspart  10 Units Subcutaneous TID WC  . insulin detemir  35 Units Subcutaneous BID  . isosorbide mononitrate  30 mg Oral Daily  . pneumococcal 23 valent vaccine  0.5  mL Intramuscular Tomorrow-1000  . predniSONE  60 mg Oral Q breakfast  . sodium chloride  3 mL Intravenous Q12H  . warfarin  15 mg Oral ONCE-1800  . Warfarin - Pharmacist Dosing Inpatient   Does not apply q1800     LOS: 6 days   [name], [name] 01/06/2015,1:07 PM  Renal Attending: I agree with the note and evaluation as planned above.  I have taken a history today and examined the patient. Jasier Calabretta C

## 2015-01-07 LAB — CBC
HEMATOCRIT: 31.8 % — AB (ref 39.0–52.0)
HEMOGLOBIN: 10.8 g/dL — AB (ref 13.0–17.0)
MCH: 25.5 pg — ABNORMAL LOW (ref 26.0–34.0)
MCHC: 34 g/dL (ref 30.0–36.0)
MCV: 75.2 fL — ABNORMAL LOW (ref 78.0–100.0)
Platelets: 251 10*3/uL (ref 150–400)
RBC: 4.23 MIL/uL (ref 4.22–5.81)
RDW: 14.4 % (ref 11.5–15.5)
WBC: 8.5 10*3/uL (ref 4.0–10.5)

## 2015-01-07 LAB — HEPARIN LEVEL (UNFRACTIONATED)
HEPARIN UNFRACTIONATED: 0.71 [IU]/mL — AB (ref 0.30–0.70)
HEPARIN UNFRACTIONATED: 0.8 [IU]/mL — AB (ref 0.30–0.70)

## 2015-01-07 LAB — PROTIME-INR
INR: 1.54 — AB (ref 0.00–1.49)
PROTHROMBIN TIME: 18.6 s — AB (ref 11.6–15.2)

## 2015-01-07 LAB — BASIC METABOLIC PANEL
ANION GAP: 11 (ref 5–15)
BUN: 69 mg/dL — ABNORMAL HIGH (ref 6–20)
CALCIUM: 8.1 mg/dL — AB (ref 8.9–10.3)
CO2: 18 mmol/L — ABNORMAL LOW (ref 22–32)
Chloride: 103 mmol/L (ref 101–111)
Creatinine, Ser: 4.34 mg/dL — ABNORMAL HIGH (ref 0.61–1.24)
GFR, EST AFRICAN AMERICAN: 17 mL/min — AB (ref >60)
GFR, EST NON AFRICAN AMERICAN: 15 mL/min — AB (ref >60)
Glucose, Bld: 387 mg/dL — ABNORMAL HIGH (ref 65–99)
POTASSIUM: 4.7 mmol/L (ref 3.5–5.1)
Sodium: 132 mmol/L — ABNORMAL LOW (ref 135–145)

## 2015-01-07 LAB — GLUCOSE, CAPILLARY
GLUCOSE-CAPILLARY: 450 mg/dL — AB (ref 65–99)
GLUCOSE-CAPILLARY: 450 mg/dL — AB (ref 65–99)
GLUCOSE-CAPILLARY: 235 mg/dL — AB (ref 65–99)
Glucose-Capillary: 291 mg/dL — ABNORMAL HIGH (ref 65–99)
Glucose-Capillary: 263 mg/dL — ABNORMAL HIGH (ref 65–99)
Glucose-Capillary: 435 mg/dL — ABNORMAL HIGH (ref 65–99)
Glucose-Capillary: 418 mg/dL — ABNORMAL HIGH (ref 65–99)

## 2015-01-07 MED ORDER — WARFARIN SODIUM 10 MG PO TABS
10.0000 mg | ORAL_TABLET | Freq: Once | ORAL | Status: AC
  Administered 2015-01-07: 10 mg via ORAL
  Filled 2015-01-07: qty 1

## 2015-01-07 MED ORDER — INSULIN ASPART 100 UNIT/ML ~~LOC~~ SOLN
10.0000 [IU] | Freq: Once | SUBCUTANEOUS | Status: AC
  Administered 2015-01-07: 10 [IU] via SUBCUTANEOUS

## 2015-01-07 NOTE — Progress Notes (Signed)
ANTICOAGULATION CONSULT NOTE - Follow Up Consult  Pharmacy Consult for Heparin Indication: New LLE DVT  No Known Allergies  Patient Measurements: Height: 5' 10.87" (180 cm) Weight: 299 lb 14.4 oz (136.034 kg) IBW/kg (Calculated) : 74.99 Heparin Dosing Weight: 105 kg  Vital Signs: Temp: 98.2 F (36.8 C) (08/20 1621) Temp Source: Oral (08/20 1621) BP: 131/79 mmHg (08/20 1621) Pulse Rate: 102 (08/20 1621)  Labs:  Recent Labs  01/05/15 0053  01/06/15 0429 01/07/15 0440 01/07/15 1623  HGB 12.2*  --  11.9* 10.8*  --   HCT 35.7*  --  35.3* 31.8*  --   PLT 293  --  257 251  --   LABPROT 14.0  --  14.4 18.6*  --   INR 1.06  --  1.10 1.54*  --   HEPARINUNFRC 0.27*  < > 0.31 0.71* 0.80*  CREATININE 5.17*  --  4.80* 4.34*  --   < > = values in this interval not displayed.  Estimated Creatinine Clearance: 29.6 mL/min (by C-G formula based on Cr of 4.34).   Assessment: 63 yoM admitted 12/31/2014 found to have new LLE DVT on 8/14. Pharmacy to dose warfarin and heparin bridge. INR is still subtherapeutic 1.54 after 10mg , 10 mg, and 15 mg.  H/H stable, Plts wnl. No s/sx bleeding noted.  PM HL still elevated at 0.80  Goal of Therapy:  INR 2-3 Heparin level 0.3-0.7 units/ml Monitor platelets by anticoagulation protocol: Yes   Plan:  - Decrease heparin to 1900 units/hour - Daily HL, INR, Monitor s/sx bleeding  Thank you Okey Regal, PharmD [phone]

## 2015-01-07 NOTE — Progress Notes (Signed)
ANTICOAGULATION CONSULT NOTE - Follow Up Consult  Pharmacy Consult for heparin and warfarin Indication: New LLE DVT  No Known Allergies  Patient Measurements: Height: 5' 10.87" (180 cm) Weight: 299 lb 14.4 oz (136.034 kg) IBW/kg (Calculated) : 74.99 Heparin Dosing Weight: 105 kg  Vital Signs: Temp: 98.1 F (36.7 C) (08/20 0500) Temp Source: Oral (08/20 0500) BP: 145/98 mmHg (08/20 0500) Pulse Rate: 101 (08/20 0500)  Labs:  Recent Labs  01/05/15 0053  01/05/15 1818 01/06/15 0429 01/07/15 0440  HGB 12.2*  --   --  11.9* 10.8*  HCT 35.7*  --   --  35.3* 31.8*  PLT 293  --   --  257 251  LABPROT 14.0  --   --  14.4 18.6*  INR 1.06  --   --  1.10 1.54*  HEPARINUNFRC 0.27*  < > 0.34 0.31 0.71*  CREATININE 5.17*  --   --  4.80* 4.34*  < > = values in this interval not displayed.  Estimated Creatinine Clearance: 29.6 mL/min (by C-G formula based on Cr of 4.34).   Assessment: 64 yoM admitted 12/31/2014 found to have new LLE DVT on 8/14. Pharmacy to dose warfarin and heparin bridge. INR is still subtherapeutic 1.54 after 10mg , 10 mg, and 15 mg. HL 0.71 (slightly supratherapeutic). H/H stable, Plts wnl. No s/sx bleeding noted.  Goal of Therapy:  INR 2-3 Heparin level 0.3-0.7 units/ml Monitor platelets by anticoagulation protocol: Yes   Plan:  - Decrease heparin to 2100 units/hour - Will give warfarin 10 mg x1 tonight - Will check HL in 8 hours  - Daily HL, INR, Monitor s/sx bleeding  Casilda Carls, PharmD. Clinical Pharmacist Resident Pager: [phone]

## 2015-01-07 NOTE — Progress Notes (Signed)
Assessment:  49 yo male with DM II, HTN, no health care contact in a long time, here with CHF, AKI, and LLE DVT.  1. Acute on CKD 3 - b/l SCr 1.2 on 07/2014. Had active sediment w rbc/wbc casts and heavy proteinuria (13 gm by UPC ratio).  BX showed collapsing FSGS. HIV negative. Other serologies negative. Started prednisone  daily. Crt improving slowly highest 5.3, now 4.34.  2. CKD likely from diabetes/HTN  3. 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 Card plans no LHC due to renal insufficiency. Diuresing well on lasix.  4. DM x 20 years - needs tight control as this can worsen in the setting of steroid.  5. HTN - new diagnosis- controlled currently on coreg 6.25mg  bid, lasix 160 po BID, hydral 25 q8hr and imdur 24hr tab 30 daily.  5. Obesity - had discussion about weight loss strategies. Agreed to decreasing quantity of food and exercising.  6    FH of ESRD(father) 7. LLE DVT - was off anticoag currently due to recent kidney bx, now on coumadin+heparin bridge.   Plan: 1. Needs long term steroid for his collapsing FSGS. Continue prednisone  daily. Avoid NSAIDs and other nephrotoxins. 2. Diuresing well on current lasix dose. Continue lasix  BID dose.  3. Discussed importance of contolling his Blood sugar strictly as this may worsen in the setting of steroid.  4. Follow up with Dr. Grant Fontana outpatient in 3-4 weeks. Also needs PCP follow up. 5. We will sign off. Please call us if needed.    Subjective: Interval History: feeling better, denies SOB, cp.  Objective: Vital signs in last 24 hours: Temp:  [97.8 F (36.6 C)-98.2 F (36.8 C)] 98.2 F (36.8 C) (08/20 0908) Pulse Rate:  [101-105] 105 (08/20 0908) Resp:  [14-18] 18 (08/20 0908) BP: (114-145)/(67-98) 124/77 mmHg (08/20 0908) SpO2:  [94 %-97 %] 97 % (08/20 0908) Weight:  [299 lb 14.4 oz (136.034 kg)] 299 lb 14.4 oz (136.034 kg) (08/19 2100) Weight change: 9 lb 3 oz (4.167 kg)  Intake/Output from  previous day: 08/19 0701 - 08/20 0700 In: 1136.8 [P.O.:680; I.V.:456.8] Out: 1550 [Urine:1550] Intake/Output this shift: Total I/O In: 720 [P.O.:720] Out: 300 [Urine:300]  General appearance: alert and cooperative Resp: clear to auscultation bilaterally Chest wall: no tenderness Cardio: regular rate and rhythm, S1, S2 normal, no murmur, click, rub or gallop Extremities: edema trace b/l LE's. Improving. L>R with large left calf.    Lab Results:  Recent Labs  01/06/15 0429 01/07/15 0440  WBC 6.4 8.5  HGB 11.9* 10.8*  HCT 35.3* 31.8*  PLT 257 251   BMET:   Recent Labs  01/06/15 0429 01/07/15 0440  NA 134* 132*  K 4.1 4.7  CL 101 103  CO2 21* 18*  GLUCOSE 270* 387*  BUN 64* 69*  CREATININE 4.80* 4.34*  CALCIUM 8.3* 8.1*   No results for input(s): PTH in the last 72 hours. Iron Studies: No results for input(s): IRON, TIBC, TRANSFERRIN, FERRITIN in the last 72 hours. Studies/Results: No results found. Scheduled: . carvedilol  12.5 mg Oral BID WC  . furosemide  40 mg Oral BID  . hydrALAZINE  25 mg Oral 3 times per day  . insulin aspart  0-20 Units Subcutaneous TID WC  . insulin aspart  0-5 Units Subcutaneous QHS  . insulin aspart  10 Units Subcutaneous TID WC  . insulin detemir  35 Units Subcutaneous BID  . isosorbide mononitrate  30 mg Oral Daily  . nicotine  21 mg Transdermal Daily  . pneumococcal 23 valent vaccine  0.5 mL Intramuscular Tomorrow-1000  . predniSONE  60 mg Oral Q breakfast  . sodium chloride  3 mL Intravenous Q12H  . warfarin  10 mg Oral ONCE-1800  . Warfarin - Pharmacist Dosing Inpatient   Does not apply q1800     LOS: 7 days   [name], [name] 01/07/2015,1:40 PM  Renal Attending: Agree with above, Collapsing FSGS.  Will treat with steroids and f/u as OP. Cedarius Kersh C

## 2015-01-07 NOTE — Progress Notes (Addendum)
  CRITICAL VALUE ALERT  Critical value received: CBG 450  Date of notification: 01/07/15  Time of notification:  1621  Critical value read back:Yes.    Nurse who received alert:  K. Karleen Hampshire RN  MD notified (1st page):  Cena Benton  Time of first page:  1623  Responding MD:  Cena Benton  Time MD responded:  1625  Per Dr. Cena Benton will cover with 20 units on insulin scale and additional 10 units with meal and recheck CBG.   Bess Kinds M

## 2015-01-07 NOTE — Progress Notes (Signed)
Pt blood sugar 469. Mullens MD notified, ordered to give scheduled bedtime levemir 35 units and novolog 5 units. Will continue to monitor. Littie Deeds, RN

## 2015-01-07 NOTE — Progress Notes (Signed)
CRITICAL VALUE ALERT  Critical value received: CBG 435  Date of notification: 01/07/2015   Time of notification:  1820  Critical value read back:Yes.    Nurse who received alert:  Bess Kinds, RN  MD notified (1st page):  Dr. Cena Benton  Time of first page:  1822  MD notified (2nd page): Cena Benton  Time of second page: 1845  Responding MD: Cena Benton  Time MD responded:  [phone]

## 2015-01-07 NOTE — Progress Notes (Signed)
[facility] PROGRESS NOTE  Charles Montgomery ZOX:[id number] DOB: 05-16-1966 DOA: 12/31/2014 PCP: Georgann Housekeeper, MD  Assessment/Plan:  Acute on CKD stage III- patient has been taking NSAIDs and doxycycline at home. Creatinine went up to 4.37 from baseline of 1.58 on 12/17/2014. Patient was given Lasix yesterday, nephrology has been consulted and workup for the acute kidney injury is underway. Renal ultrasound was normal.  - Nephrology managing. Bx showed Collapsing FSGS patient started on 60 mg of prednisone daily.  CHF  - currently on lasix 80 mg po bid. Echocardiogram showed EF 25%. VQ scan negative for pulmonary embolism.  Hypertension - Not well controlled currently Coreg recently increased, continue imdur  Diabetes mellitus-  - Continue current regimen - On Levemir 35 U SQ BID  Left lower extremity DVT- patient found to have left lower ext DVT in the peroneal vein.  Patient is refusing IVC filter. Discussed with interventional radiology who felt patient was okay to start anticoagulation 2 days out. - heparin with bridging to Coumadin.  - INR 1.54   Code Status: Full code Family Communication: No family at bedside Disposition Plan: Home Once INR within target range   Consultants:  Nephrology  Vascular  Cardiology  Procedures:  None  Antibiotics:  None  HPI/Subjective: 49 year old male with a history of hypertension, diabetes mellitus came with worsening shortness of breath and swelling of both lower extremities. Patient found to be in acute kidney injury. This morning patient feels good, yesterday had left sided chest pain, V/Q scan was negative.  No new complaints reported today.  Objective: Filed Vitals:   01/07/15 0908  BP: 124/77  Pulse: 105  Temp: 98.2 F (36.8 C)  Resp: 18    Intake/Output Summary (Last 24 hours) at 01/07/15 1552 Last data filed at 01/07/15 1526  Gross per 24 hour  Intake 2136.82 ml  Output   1400 ml  Net 736.82 ml    Filed Weights   01/05/15 0500 01/05/15 2009 01/06/15 2100  Weight: 131.997 kg (291 lb) 131.867 kg (290 lb 11.4 oz) 136.034 kg (299 lb 14.4 oz)    Exam:   General:  Appears in no acute distress, awake and alert.  Cardiovascular: S1-S2 regular, no rubs  Respiratory: Clear to auscultation bilaterally, no wheezes  Abdomen: Soft, nontender, no organomegaly  Musculoskeletal: No edema of the lower extremities   Data Reviewed: Basic Metabolic Panel:  Recent Labs Lab 01/03/15 0515 01/04/15 0820 01/05/15 0053 01/06/15 0429 01/07/15 0440  NA 131* 132* 134* 134* 132*  K 4.2 4.1 5.0 4.1 4.7  CL 100* 99* 102 101 103  CO2 21* 22 22 21* 18*  GLUCOSE 376* 194* 265* 270* 387*  BUN 69* 72* 75* 64* 69*  CREATININE 5.00* 5.21* 5.17* 4.80* 4.34*  CALCIUM 8.2* 8.2* 8.3* 8.3* 8.1*   Liver Function Tests: No results for input(s): AST, ALT, ALKPHOS, BILITOT, PROT, ALBUMIN in the last 168 hours. No results for input(s): LIPASE, AMYLASE in the last 168 hours. CBC:  Recent Labs Lab 01/05/15 0053 01/06/15 0429 01/07/15 0440  WBC 7.4 6.4 8.5  HGB 12.2* 11.9* 10.8*  HCT 35.7* 35.3* 31.8*  MCV 75.3* 76.1* 75.2*  PLT 293 257 251   Cardiac Enzymes:  Recent Labs Lab 12/31/14 1854  CKTOTAL 127   BNP (last 3 results)  Recent Labs  12/30/14 2123  BNP 371.8*     CBG:  Recent Labs Lab 01/06/15 1632 01/06/15 2146 01/06/15 2305 01/07/15 0742 01/07/15 1110  GLUCAP 379* 465* 469* 291* 263*  Recent Results (from the past 240 hour(s))  Urine culture     Status: None   Collection Time: 12/31/14  9:52 AM  Result Value Ref Range Status   Specimen Description URINE, CLEAN CATCH  Final   Special Requests Normal  Final   Culture 6,000 COLONIES/mL INSIGNIFICANT GROWTH  Final   Report Status 01/01/2015 FINAL  Final     Studies: No results found.  Scheduled Meds: . carvedilol  12.5 mg Oral BID WC  . furosemide  40 mg Oral BID  . hydrALAZINE  25 mg Oral 3 times per day   . insulin aspart  0-20 Units Subcutaneous TID WC  . insulin aspart  0-5 Units Subcutaneous QHS  . insulin aspart  10 Units Subcutaneous TID WC  . insulin detemir  35 Units Subcutaneous BID  . isosorbide mononitrate  30 mg Oral Daily  . nicotine  21 mg Transdermal Daily  . pneumococcal 23 valent vaccine  0.5 mL Intramuscular Tomorrow-1000  . predniSONE  60 mg Oral Q breakfast  . sodium chloride  3 mL Intravenous Q12H  . warfarin  10 mg Oral ONCE-1800  . Warfarin - Pharmacist Dosing Inpatient   Does not apply q1800   Continuous Infusions: . heparin 2,100 Units/hr (01/07/15 0750)    Active Problems:   Acute renal failure syndrome   Nephrotic syndrome   Hypoalbuminemia   Peripheral edema   Diabetes mellitus   Hypertension   AKI (acute kidney injury)   AP (abdominal pain)   Proteinuria   Edema   SOB (shortness of breath)   Acute systolic heart failure    Time spent: 30 min    [name]  [facility] Pager [phone]. If 7PM-7AM, please contact night-coverage at [email or web address], password [facility] 01/07/2015, 3:52 PM  LOS: 7 days

## 2015-01-08 LAB — BASIC METABOLIC PANEL
ANION GAP: 9 (ref 5–15)
BUN: 68 mg/dL — ABNORMAL HIGH (ref 6–20)
CHLORIDE: 104 mmol/L (ref 101–111)
CO2: 23 mmol/L (ref 22–32)
Calcium: 8.7 mg/dL — ABNORMAL LOW (ref 8.9–10.3)
Creatinine, Ser: 4.01 mg/dL — ABNORMAL HIGH (ref 0.61–1.24)
GFR calc Af Amer: 19 mL/min — ABNORMAL LOW (ref >60)
GFR, EST NON AFRICAN AMERICAN: 16 mL/min — AB (ref >60)
GLUCOSE: 193 mg/dL — AB (ref 65–99)
POTASSIUM: 4.3 mmol/L (ref 3.5–5.1)
Sodium: 136 mmol/L (ref 135–145)

## 2015-01-08 LAB — CBC
HEMATOCRIT: 35.8 % — AB (ref 39.0–52.0)
HEMOGLOBIN: 11.8 g/dL — AB (ref 13.0–17.0)
MCH: 25.4 pg — ABNORMAL LOW (ref 26.0–34.0)
MCHC: 33 g/dL (ref 30.0–36.0)
MCV: 77.2 fL — AB (ref 78.0–100.0)
Platelets: 330 10*3/uL (ref 150–400)
RBC: 4.64 MIL/uL (ref 4.22–5.81)
RDW: 14.6 % (ref 11.5–15.5)
WBC: 10.9 10*3/uL — ABNORMAL HIGH (ref 4.0–10.5)

## 2015-01-08 LAB — PROTIME-INR
INR: 2.24 — AB (ref 0.00–1.49)
PROTHROMBIN TIME: 24.6 s — AB (ref 11.6–15.2)

## 2015-01-08 LAB — HEPARIN LEVEL (UNFRACTIONATED): Heparin Unfractionated: 0.54 IU/mL (ref 0.30–0.70)

## 2015-01-08 LAB — GLUCOSE, CAPILLARY
GLUCOSE-CAPILLARY: 135 mg/dL — AB (ref 65–99)
GLUCOSE-CAPILLARY: 204 mg/dL — AB (ref 65–99)

## 2015-01-08 MED ORDER — PREDNISONE 20 MG PO TABS: 60.0000 mg | ORAL_TABLET | Freq: Every day | ORAL | Status: DC

## 2015-01-08 MED ORDER — INSULIN ASPART 100 UNIT/ML ~~LOC~~ SOLN: 10.0000 [IU] | Freq: Three times a day (TID) | SUBCUTANEOUS | Status: DC

## 2015-01-08 MED ORDER — HYDRALAZINE HCL 25 MG PO TABS: 25.0000 mg | ORAL_TABLET | Freq: Three times a day (TID) | ORAL | Status: DC

## 2015-01-08 MED ORDER — CARVEDILOL 12.5 MG PO TABS: 12.5000 mg | ORAL_TABLET | Freq: Two times a day (BID) | ORAL | Status: DC

## 2015-01-08 MED ORDER — INSULIN DETEMIR 100 UNIT/ML ~~LOC~~ SOLN: 37.0000 [IU] | Freq: Two times a day (BID) | SUBCUTANEOUS | Status: DC

## 2015-01-08 MED ORDER — ISOSORBIDE MONONITRATE ER 30 MG PO TB24: 30.0000 mg | ORAL_TABLET | Freq: Every day | ORAL | Status: DC

## 2015-01-08 MED ORDER — FUROSEMIDE 40 MG PO TABS: 40.0000 mg | ORAL_TABLET | Freq: Two times a day (BID) | ORAL | Status: DC

## 2015-01-08 MED ORDER — WARFARIN SODIUM 7.5 MG PO TABS: 7.5000 mg | ORAL_TABLET | Freq: Every day | ORAL | Status: DC

## 2015-01-08 MED ORDER — WARFARIN SODIUM 7.5 MG PO TABS
7.5000 mg | ORAL_TABLET | Freq: Every day | ORAL | Status: DC
Start: 2015-01-08 — End: 2015-01-08

## 2015-01-08 NOTE — Progress Notes (Signed)
[facility] PROGRESS NOTE  Charles Montgomery ZOX:[id number] DOB: [birth year] DOA: 12/31/2014 PCP: Georgann Housekeeper, MD  Assessment/Plan:  Acute on CKD stage III- patient has been taking NSAIDs and doxycycline at home. Creatinine went up to 4.37 from baseline of 1.58 on 12/17/2014. Patient was given Lasix yesterday, nephrology has been consulted and workup for the acute kidney injury is underway. Renal ultrasound was normal.  - Nephrology managing. Bx showed Collapsing FSGS patient started on 60 mg of prednisone daily.  CHF  - currently on lasix 80 mg po bid. Echocardiogram showed EF 25%. VQ scan negative for pulmonary embolism.  Hypertension - Not well controlled currently Coreg recently increased, continue imdur  Diabetes mellitus-  - Continue current regimen - On Levemir 35 U SQ BID  Left lower extremity DVT- patient found to have left lower ext DVT in the peroneal vein.  Patient is refusing IVC filter. Discussed with interventional radiology who felt patient was okay to start anticoagulation 2 days out. - heparin with bridging to Coumadin.  Heparin discontinued as patient's INR is at target. Will ensure patient's INR is at target for 24 hours prior to d/c - INR 2.24   Code Status: Full code Family Communication: No family at bedside Disposition Plan: Home Once INR within target range   Consultants:  Nephrology  Vascular  Cardiology  Procedures:  None  Antibiotics:  None  HPI/Subjective: 49 year old male with a history of hypertension, diabetes mellitus came with worsening shortness of breath and swelling of both lower extremities. Patient found to be in acute kidney injury.  Pt has no new complaints.  Objective: Filed Vitals:   01/08/15 0845  BP: 141/65  Pulse: 105  Temp: 97.8 F (36.6 C)  Resp: 18    Intake/Output Summary (Last 24 hours) at 01/08/15 1419 Last data filed at 01/08/15 0846  Gross per 24 hour  Intake   1200 ml  Output   1050 ml  Net     150 ml   Filed Weights   01/05/15 2009 01/06/15 2100 01/07/15 2112  Weight: 131.867 kg (290 lb 11.4 oz) 136.034 kg (299 lb 14.4 oz) 138.483 kg (305 lb 4.8 oz)    Exam:   General:  Appears in no acute distress, awake and alert.  Cardiovascular: S1-S2 regular, no rubs  Respiratory: Clear to auscultation bilaterally, no wheezes  Abdomen: Soft, nontender, no organomegaly  Musculoskeletal: No edema of the lower extremities   Data Reviewed: Basic Metabolic Panel:  Recent Labs Lab 01/04/15 0820 01/05/15 0053 01/06/15 0429 01/07/15 0440 01/08/15 0540  NA 132* 134* 134* 132* 136  K 4.1 5.0 4.1 4.7 4.3  CL 99* 102 101 103 104  CO2 22 22 21* 18* 23  GLUCOSE 194* 265* 270* 387* 193*  BUN 72* 75* 64* 69* 68*  CREATININE 5.21* 5.17* 4.80* 4.34* 4.01*  CALCIUM 8.2* 8.3* 8.3* 8.1* 8.7*   Liver Function Tests: No results for input(s): AST, ALT, ALKPHOS, BILITOT, PROT, ALBUMIN in the last 168 hours. No results for input(s): LIPASE, AMYLASE in the last 168 hours. CBC:  Recent Labs Lab 01/05/15 0053 01/06/15 0429 01/07/15 0440 01/08/15 0540  WBC 7.4 6.4 8.5 10.9*  HGB 12.2* 11.9* 10.8* 11.8*  HCT 35.7* 35.3* 31.8* 35.8*  MCV 75.3* 76.1* 75.2* 77.2*  PLT 293 257 251 330   Cardiac Enzymes: No results for input(s): CKTOTAL, CKMB, CKMBINDEX, TROPONINI in the last 168 hours. BNP (last 3 results)  Recent Labs  12/30/14 2123  BNP 371.8*     CBG:  Recent Labs Lab 01/07/15 1822 01/07/15 1855 01/07/15 2110 01/08/15 0745 01/08/15 1111  GLUCAP 435* 418* 235* 135* 204*    Recent Results (from the past 240 hour(s))  Urine culture     Status: None   Collection Time: 12/31/14  9:52 AM  Result Value Ref Range Status   Specimen Description URINE, CLEAN CATCH  Final   Special Requests Normal  Final   Culture 6,000 COLONIES/mL INSIGNIFICANT GROWTH  Final   Report Status 01/01/2015 FINAL  Final     Studies: No results found.  Scheduled Meds: . carvedilol  12.5 mg  Oral BID WC  . furosemide  40 mg Oral BID  . hydrALAZINE  25 mg Oral 3 times per day  . insulin aspart  0-20 Units Subcutaneous TID WC  . insulin aspart  0-5 Units Subcutaneous QHS  . insulin aspart  10 Units Subcutaneous TID WC  . insulin detemir  35 Units Subcutaneous BID  . isosorbide mononitrate  30 mg Oral Daily  . nicotine  21 mg Transdermal Daily  . pneumococcal 23 valent vaccine  0.5 mL Intramuscular Tomorrow-1000  . predniSONE  60 mg Oral Q breakfast  . sodium chloride  3 mL Intravenous Q12H  . warfarin  7.5 mg Oral q1800  . Warfarin - Pharmacist Dosing Inpatient   Does not apply q1800   Continuous Infusions:    Active Problems:   Acute renal failure syndrome   Nephrotic syndrome   Hypoalbuminemia   Peripheral edema   Diabetes mellitus   Hypertension   AKI (acute kidney injury)   AP (abdominal pain)   Proteinuria   Edema   SOB (shortness of breath)   Acute systolic heart failure    Time spent: 30 min    [name]  [facility] Pager [phone]. If 7PM-7AM, please contact night-coverage at [email or web address], password [facility] 01/08/2015, 2:19 PM  LOS: 8 days

## 2015-01-08 NOTE — Discharge Summary (Signed)
Physician Discharge Summary  [place] WGN:[id number] DOB: 29-Sep-1965 DOA: 12/31/2014  PCP: Georgann Housekeeper, MD  Admit date: 12/31/2014 Discharge date: 01/08/2015  Time spent: > 35 minutes  Recommendations for Outpatient Follow-up:  1. Monitor blood sugars 2. Follow up with blood pressures 3. Reassess INR in 1 week  Discharge Diagnoses:  Active Problems:   Acute renal failure syndrome   Nephrotic syndrome   Hypoalbuminemia   Peripheral edema   Diabetes mellitus   Hypertension   AKI (acute kidney injury)   AP (abdominal pain)   Proteinuria   Edema   SOB (shortness of breath)   Acute systolic heart failure   Discharge Condition: stable  Diet recommendation: diabetic diet  Filed Weights   01/05/15 2009 01/06/15 2100 01/07/15 2112  Weight: 131.867 kg (290 lb 11.4 oz) 136.034 kg (299 lb 14.4 oz) 138.483 kg (305 lb 4.8 oz)    History of present illness:  Pt is a 48 with hypertension, DM type II, hyperlipidemia who presented to the ED complaining of shortness of breath and lower leg swelling.  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 - Patient will be discharged on prednisone - Patient is to follow up with nephrologist  DM type II - Patient has not been adhering to diabetic diet. Which is complicated blood sugar management. Will increase long-acting insulin on discharge and discharge with NovoLog. - Patient is a follow-up with primary care physician for further evaluation recommendations. - Blood sugars also elevated secondary to recent prednisone administration  DVT - Of left lower extremity and perineal vein - Patient refused IVC filter and started on Coumadin. Had heparin bridging. Patient did not want to wait the 24 hours to make sure that his INR remained at 2. I  have instructed him to get his INR rechecked within the next week will provide him prescription for Coumadin  Procedures:  Please refer to EMR for imaging study results  Consultations:  Cardiology  Vascular surgery  Nephrology  Discharge Exam: Filed Vitals:   01/08/15 0845  BP: 141/65  Pulse: 105  Temp: 97.8 F (36.6 C)  Resp: 18    General: Patient in no acute distress, alert and awake Cardiovascular: S1 and S2 within normal limits, no rubs Respiratory: No increased work of breathing, equal chest rise, no wheezes  Discharge Instructions   Discharge Instructions    Call MD for:  difficulty breathing, headache or visual disturbances    Complete by:  As directed      Call MD for:  temperature >100.4    Complete by:  As directed      Diet - low sodium heart healthy    Complete by:  As directed      Discharge instructions    Complete by:  As directed   Please f/u with your nephrologist in 1-2 weeks after hospital discharge.     Increase activity slowly    Complete by:  As directed           Current Discharge Medication List    START taking these medications   Details  carvedilol (COREG) 12.5 MG tablet Take 1 tablet (12.5 mg total) by mouth 2 (two) times daily with a meal. Qty: 60 tablet, Refills: 0    furosemide (LASIX) 40 MG tablet Take 1 tablet (40 mg total) by  mouth 2 (two) times daily. Qty: 60 tablet, Refills: 0    hydrALAZINE (APRESOLINE) 25 MG tablet Take 1 tablet (25 mg total) by mouth every 8 (eight) hours. Qty: 90 tablet, Refills: 0    isosorbide mononitrate (IMDUR) 30 MG 24 hr tablet Take 1 tablet (30 mg total) by mouth daily. Qty: 30 tablet, Refills: 0    predniSONE (DELTASONE) 20 MG tablet Take 3 tablets (60 mg total) by mouth daily with breakfast. Qty: 14 tablet, Refills: 0    warfarin (COUMADIN) 7.5 MG tablet Take 1 tablet (7.5 mg total) by mouth daily at 6 PM. Qty: 45 tablet, Refills: 0      CONTINUE these medications which have CHANGED    Details  insulin aspart (NOVOLOG) 100 UNIT/ML injection Inject 10 Units into the skin 3 (three) times daily with meals. Qty: 10 mL, Refills: 0    insulin detemir (LEVEMIR) 100 UNIT/ML injection Inject 0.37 mLs (37 Units total) into the skin 2 (two) times daily. Qty: 10 mL, Refills: 0      CONTINUE these medications which have NOT CHANGED   Details  aspirin EC 81 MG tablet Take 81 mg by mouth daily.      STOP taking these medications     metFORMIN (GLUCOPHAGE) 1000 MG tablet      doxycycline (VIBRAMYCIN) 50 MG capsule      hydrochlorothiazide (HYDRODIURIL) 25 MG tablet        No Known Allergies    The results of significant diagnostics from this hospitalization (including imaging, microbiology, ancillary and laboratory) are listed below for reference.    Significant Diagnostic Studies: Ct Abdomen Pelvis Wo Contrast  12/31/2014   CLINICAL DATA:  Subacute onset of left upper quadrant and left lower quadrant abdominal pain. Nausea, diarrhea and vomiting. Initial encounter.  EXAM: CT ABDOMEN AND PELVIS WITHOUT CONTRAST  TECHNIQUE: Multidetector CT imaging of the abdomen and pelvis was performed following the standard protocol without IV contrast.  COMPARISON:  Lumbar spine radiographs performed 08/30/2013  FINDINGS: Mild bibasilar atelectasis is noted.  The liver and spleen are unremarkable in appearance. The gallbladder is within normal limits. The pancreas and adrenal glands are unremarkable.  Bilateral perinephric stranding and fluid are seen, with stranding tracking along the courses of the ureters bilaterally. Retroperitoneal stranding is also seen. This raises concern for bilateral pyelonephritis. There is no evidence of hydronephrosis. No obstructing ureteral stones are seen.  No free fluid is identified. The small bowel is unremarkable in appearance. The stomach is within normal limits. No acute vascular abnormalities are seen.  The appendix is normal in caliber and contains air,  without evidence of appendicitis. Contrast progresses to the level of the splenic flexure of the colon. The colon is partially filled with stool and is unremarkable in appearance.  The bladder is moderately distended and grossly unremarkable. The prostate remains normal in size. No inguinal lymphadenopathy is seen.  No acute osseous abnormalities are identified. Facet disease is noted at the lower lumbar spine.  IMPRESSION: 1. Bilateral perinephric stranding and fluid, with stranding tracking along the courses of the ureters, and retroperitoneal stranding. This is concerning for bilateral pyelonephritis. Would correlate for associated symptoms and lab findings. No evidence of hydronephrosis. 2. Mild bibasilar atelectasis noted.   Electronically Signed   By: Roanna Raider M.D.   On: 12/31/2014 04:34   Dg Chest 2 View  01/01/2015   CLINICAL DATA:  Left upper quadrant abdominal pain, DVT  EXAM: CHEST  2 VIEW  COMPARISON:  12/30/2014  FINDINGS: Lungs are clear.  No pleural effusion or pneumothorax.  Mild cardiomegaly.  Visualized osseous structures are within normal limits.  IMPRESSION: No evidence of acute cardiopulmonary disease.   Electronically Signed   By: Charline Bills M.D.   On: 01/01/2015 19:01   Dg Chest 2 View  12/30/2014   CLINICAL DATA:  Shortness breath for 1 month.  Diabetes.  EXAM: CHEST - 2 VIEW  COMPARISON:  Two-view chest x-ray 03/24/2012  FINDINGS: The heart is enlarged. Mild pulmonary vascular congestion is present. No focal airspace disease is present. There are no effusions.  IMPRESSION: Cardiomegaly and mild pulmonary vascular congestion.   Electronically Signed   By: Marin Roberts M.D.   On: 12/30/2014 21:46   US Renal  12/31/2014   CLINICAL DATA:  Nephrotic syndrome.  EXAM: RENAL / URINARY TRACT ULTRASOUND COMPLETE  COMPARISON:  CT abdomen 12/31/2014  FINDINGS: Right Kidney:  Length: 12.2 cm. Echogenicity within normal limits. No mass or hydronephrosis visualized.  Left  Kidney:  Length: 10.5 cm. Echogenicity within normal limits. No mass or hydronephrosis visualized.  Bladder:  Appears normal for degree of bladder distention.  IMPRESSION: Normal renal ultrasound.   Electronically Signed   By: Elige Ko   On: 12/31/2014 07:45   Nm Pulmonary Perf And Vent  01/02/2015   CLINICAL DATA:  Shortness of breath  EXAM: NUCLEAR MEDICINE VENTILATION - PERFUSION LUNG SCAN  TECHNIQUE: Ventilation images were obtained in multiple projections using inhaled aerosol Tc-77m DTPA. Perfusion images were obtained in multiple projections after intravenous injection of Tc-22m MAA.  RADIOPHARMACEUTICALS:  43.4 Technetium-55m DTPA aerosol inhalation and 62.7 Technetium-37m MAA IV  COMPARISON:  Chest x-ray from the same day  FINDINGS: Ventilation: No focal ventilation defect.  Perfusion: No defects.  IMPRESSION: Normal lung perfusion.   Electronically Signed   By: Marnee Spring M.D.   On: 01/02/2015 03:42   Ct Biopsy  01/02/2015   CLINICAL DATA:  49 year old male with a history of chronic kidney disease. He has been referred for image guided medical renal biopsy.  EXAM: CT-GUIDED BIOPSY left kidney for medical renal purpose  MEDICATIONS AND MEDICAL HISTORY: Versed 1.0 mg, Fentanyl 50 mcg.  Additional Medications: None.  ANESTHESIA/SEDATION: Moderate sedation time: 15 minutes  PROCEDURE: The procedure, risks, benefits, and alternatives were explained to the patient. Questions regarding the procedure were encouraged and answered. The patient understands and consents to the procedure.  Patient is position in the prone position on CT gantry table and a scout CT of the kidneys was performed.  The left flank was prepped with Betadine in a sterile fashion, and a sterile drape was applied covering the operative field. A sterile gown and sterile gloves were used for the procedure.  Once the patient is prepped and draped in the usual sterile fashion, the skin and subcutaneous tissues were generously  infiltrated with 1% lidocaine for local anesthesia.  Under CT guidance, a(n) 17 gauge guide needle was advanced into the lateral cortex of the lower pole of the left kidney. Four separate 18 gauge core biopsy were obtained. Specimen was placed in saline for transportation to the lab.  Two Gel-Foam pledgets were infused with a small amount of saline.  Final CT image was acquired.  The patient tolerated the procedure well and remained hemodynamically stable throughout.  No complications were encountered and no significant blood loss was encountered.  FINDINGS: CT images during the case demonstrate placement of the needle tip of trocar guide needle into the lateral  cortex of the lower pole of the left kidney.  Images after the case demonstrate small amount of expected fluid adjacent to the biopsy site.  Patient remained hemodynamically stable throughout.  COMPLICATIONS: None  IMPRESSION: Status post CT-guided core biopsy of the left kidney for medical renal purpose. Tissue specimen sent to pathology for complete histopathologic analysis.  Signed,  Yvone Neu. Loreta Ave, DO  [facility]  [facility]   Electronically Signed   By: Gilmer Mor D.O.   On: 01/02/2015 17:53    Microbiology: Recent Results (from the past 240 hour(s))  Urine culture     Status: None   Collection Time: 12/31/14  9:52 AM  Result Value Ref Range Status   Specimen Description URINE, CLEAN CATCH  Final   Special Requests Normal  Final   Culture 6,000 COLONIES/mL INSIGNIFICANT GROWTH  Final   Report Status 01/01/2015 FINAL  Final     Labs: Basic Metabolic Panel:  Recent Labs Lab 01/04/15 0820 01/05/15 0053 01/06/15 0429 01/07/15 0440 01/08/15 0540  NA 132* 134* 134* 132* 136  K 4.1 5.0 4.1 4.7 4.3  CL 99* 102 101 103 104  CO2 22 22 21* 18* 23  GLUCOSE 194* 265* 270* 387* 193*  BUN 72* 75* 64* 69* 68*  CREATININE 5.21* 5.17* 4.80* 4.34* 4.01*  CALCIUM 8.2* 8.3* 8.3* 8.1* 8.7*    Liver Function Tests: No results for input(s): AST, ALT, ALKPHOS, BILITOT, PROT, ALBUMIN in the last 168 hours. No results for input(s): LIPASE, AMYLASE in the last 168 hours. No results for input(s): AMMONIA in the last 168 hours. CBC:  Recent Labs Lab 01/05/15 0053 01/06/15 0429 01/07/15 0440 01/08/15 0540  WBC 7.4 6.4 8.5 10.9*  HGB 12.2* 11.9* 10.8* 11.8*  HCT 35.7* 35.3* 31.8* 35.8*  MCV 75.3* 76.1* 75.2* 77.2*  PLT 293 257 251 330   Cardiac Enzymes: No results for input(s): CKTOTAL, CKMB, CKMBINDEX, TROPONINI in the last 168 hours. BNP: BNP (last 3 results)  Recent Labs  12/30/14 2123  BNP 371.8*    ProBNP (last 3 results) No results for input(s): PROBNP in the last 8760 hours.  CBG:  Recent Labs Lab 01/07/15 1822 01/07/15 1855 01/07/15 2110 01/08/15 0745 01/08/15 1111  GLUCAP 435* 418* 235* 135* 204*       Signed:  [name]  [facility] 01/08/2015, 2:48 PM

## 2015-01-08 NOTE — Progress Notes (Signed)
ANTICOAGULATION CONSULT NOTE - Follow Up Consult  Pharmacy Consult for Coumadin Indication: DVT  No Known Allergies  Patient Measurements: Height: 5' 10.87" (180 cm) Weight: (!) 305 lb 4.8 oz (138.483 kg) IBW/kg (Calculated) : 74.99   Vital Signs: Temp: 97.8 F (36.6 C) (08/21 0845) Temp Source: Oral (08/21 0845) BP: 141/65 mmHg (08/21 0845) Pulse Rate: 105 (08/21 0845)  Labs:  Recent Labs  01/06/15 0429 01/07/15 0440 01/07/15 1623 01/08/15 0540  HGB 11.9* 10.8*  --  11.8*  HCT 35.3* 31.8*  --  35.8*  PLT 257 251  --  330  LABPROT 14.4 18.6*  --  24.6*  INR 1.10 1.54*  --  2.24*  HEPARINUNFRC 0.31 0.71* 0.80* 0.54  CREATININE 4.80* 4.34*  --  4.01*    Estimated Creatinine Clearance: 32.3 mL/min (by C-G formula based on Cr of 4.01).  Assessment:  Anticoagulation: Heparin + Warfarin for new LLE DVT found on 8/14 ,but anticoag held until after renal biopsy and recommended by IR to restart on 8/17 evening. VQ negative for PE. VTE overlap D#5/5. INR 2.24 after 10mg , 10 mg,15 mg, 10mg . HL 0.54. Hgb 11.8. Plts ok.  Patient reports biting his lip with some bleeding. OK to d/c heparin drip now. Would recommend discharge home with 5mg  tabs (take 7.5mg  daily and recheck around Wednesday)   Goal of Therapy:  INR 2-3 Monitor platelets by anticoagulation protocol: Yes   Plan:  Coumadin 7.5mg  daily Daily INR while remains inpatient.  Gershon Shorten S. Merilynn Finland, PharmD, BCPS Clinical Staff Pharmacist Pager [phone]  Misty Stanley Stillinger 01/08/2015,12:51 PM

## 2015-01-08 NOTE — Progress Notes (Signed)
Pt bit his lip twice. Requested to d/c heparin. Pt says MD said to him that he wanted to see how pt's labs were without heparin, just on Coumadin. RN stopped heparin per pt request. Notified MD and Pharmacy.Order for continuous heparin discontinued. Also gave pt ice for comfort and to help bleeding. Will continue to monitor.    Ok Anis, RN 01/08/2015 1:36 PM

## 2015-01-11 DIAGNOSIS — Z9889 Other specified postprocedural states: Secondary | ICD-10-CM | POA: Insufficient documentation

## 2015-01-13 ENCOUNTER — Encounter (HOSPITAL_COMMUNITY): Payer: Self-pay

## 2015-04-12 ENCOUNTER — Ambulatory Visit (INDEPENDENT_AMBULATORY_CARE_PROVIDER_SITE_OTHER): Payer: BLUE CROSS/BLUE SHIELD | Admitting: Cardiology

## 2015-04-12 VITALS — BP 124/66 | HR 106 | Ht 70.5 in | Wt 320.0 lb

## 2015-04-12 DIAGNOSIS — R079 Chest pain, unspecified: Secondary | ICD-10-CM | POA: Diagnosis not present

## 2015-04-12 DIAGNOSIS — I420 Dilated cardiomyopathy: Secondary | ICD-10-CM | POA: Insufficient documentation

## 2015-04-12 DIAGNOSIS — K219 Gastro-esophageal reflux disease without esophagitis: Secondary | ICD-10-CM | POA: Insufficient documentation

## 2015-04-12 DIAGNOSIS — I5022 Chronic systolic (congestive) heart failure: Secondary | ICD-10-CM | POA: Insufficient documentation

## 2015-04-12 DIAGNOSIS — E785 Hyperlipidemia, unspecified: Secondary | ICD-10-CM | POA: Insufficient documentation

## 2015-04-12 DIAGNOSIS — N058 Unspecified nephritic syndrome with other morphologic changes: Secondary | ICD-10-CM | POA: Insufficient documentation

## 2015-04-12 DIAGNOSIS — R0683 Snoring: Secondary | ICD-10-CM | POA: Insufficient documentation

## 2015-04-12 DIAGNOSIS — E039 Hypothyroidism, unspecified: Secondary | ICD-10-CM | POA: Insufficient documentation

## 2015-04-12 DIAGNOSIS — E1140 Type 2 diabetes mellitus with diabetic neuropathy, unspecified: Secondary | ICD-10-CM | POA: Insufficient documentation

## 2015-04-12 DIAGNOSIS — I10 Essential (primary) hypertension: Secondary | ICD-10-CM

## 2015-04-12 DIAGNOSIS — E7800 Pure hypercholesterolemia, unspecified: Secondary | ICD-10-CM | POA: Insufficient documentation

## 2015-04-12 DIAGNOSIS — I5021 Acute systolic (congestive) heart failure: Secondary | ICD-10-CM

## 2015-04-12 DIAGNOSIS — E11319 Type 2 diabetes mellitus with unspecified diabetic retinopathy without macular edema: Secondary | ICD-10-CM | POA: Insufficient documentation

## 2015-04-12 DIAGNOSIS — N189 Chronic kidney disease, unspecified: Secondary | ICD-10-CM

## 2015-04-12 DIAGNOSIS — G4719 Other hypersomnia: Secondary | ICD-10-CM

## 2015-04-12 DIAGNOSIS — E669 Obesity, unspecified: Secondary | ICD-10-CM | POA: Insufficient documentation

## 2015-04-12 DIAGNOSIS — R0989 Other specified symptoms and signs involving the circulatory and respiratory systems: Secondary | ICD-10-CM

## 2015-04-12 DIAGNOSIS — F17210 Nicotine dependence, cigarettes, uncomplicated: Secondary | ICD-10-CM | POA: Insufficient documentation

## 2015-04-12 DIAGNOSIS — R5383 Other fatigue: Secondary | ICD-10-CM | POA: Insufficient documentation

## 2015-04-12 DIAGNOSIS — I82509 Chronic embolism and thrombosis of unspecified deep veins of unspecified lower extremity: Secondary | ICD-10-CM | POA: Insufficient documentation

## 2015-04-12 HISTORY — DX: Dilated cardiomyopathy: I42.0

## 2015-04-12 LAB — BASIC METABOLIC PANEL
BUN: 56 mg/dL — ABNORMAL HIGH (ref 7–25)
CHLORIDE: 105 mmol/L (ref 98–110)
CO2: 23 mmol/L (ref 20–31)
Calcium: 9 mg/dL (ref 8.6–10.3)
Creat: 3.9 mg/dL — ABNORMAL HIGH (ref 0.60–1.35)
GLUCOSE: 181 mg/dL — AB (ref 65–99)
Potassium: 3.9 mmol/L (ref 3.5–5.3)
SODIUM: 140 mmol/L (ref 135–146)

## 2015-04-12 LAB — FERRITIN: Ferritin: 407 ng/mL — ABNORMAL HIGH (ref 22–322)

## 2015-04-12 MED ORDER — CARVEDILOL 12.5 MG PO TABS: 12.5000 mg | ORAL_TABLET | Freq: Two times a day (BID) | ORAL | Status: DC

## 2015-04-12 MED ORDER — CARVEDILOL 25 MG PO TABS: 25.0000 mg | ORAL_TABLET | Freq: Two times a day (BID) | ORAL | Status: DC

## 2015-04-12 NOTE — Progress Notes (Signed)
Cardiology Office Note   Date:  04/12/2015   ID:  Charles Montgomery, DOB Mar 03, 1966, MRN [id number]  PCP:  Georgann Housekeeper, MD    Chief Complaint  Patient presents with  . Congestive Heart Failure      History of Present Illness: Charles Montgomery is a 49 y.o. male who presents for evaluation of CHF.  He has a history of type II DM, GERD, hypothyroidism, HTN, tobacco use, dyslipidemia, DVT and recent hospitalization at the end of the summer for acute systolic CHF.  He has CKD with focal glomerulonephritis and nephrotic syndrome.  2D echo during hospitalization showed and EF of 20-25%. He was placed on BB, Lasix and Imdur.  He is not on an ACE I due to underlying CKD.  He has CRF including DM, obesity, HTN, dyslipidemia and is referred for further cardiac eval.  He says that he has been having chest pain since he went in the hospital in August.  It is constant and has never gone away for 3 months.  He says that it is constantly tight.  He is constantly SOB with significant DOE as well.  He has PND and orthopnea.  He sleeps on his side.  He has excessive daytime sleepiness and always is sleepy during the day.   He denies any LE edema.  He says that he gets dizzy when he gets out of bed.  He denies any dizziness or syncope    Past Medical History  Diagnosis Date  . Diabetes mellitus   . Hypertension   . High cholesterol   . GERD (gastroesophageal reflux disease)   . Hypothyroid   . Obesity   . Diabetic neuropathy (HCC)   . Hyperlipidemia   . Diabetic retinopathy of left eye (HCC)   . Systolic heart failure (HCC)   . CKD (chronic kidney disease)   . Leg DVT (deep venous thromboembolism), chronic (HCC)   . Nephrotic syndrome   . Fatigue   . Snores   . Cigarette nicotine dependence, uncomplicated   . 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   Current Outpatient Prescriptions    Medication Sig Dispense Refill  . apixaban (ELIQUIS) 2.5 MG TABS tablet Take 2.5 mg by mouth 2 (two) times daily.    Marland Kitchen aspirin EC 81 MG tablet Take 81 mg by mouth daily.    . Blood Glucose Calibration (CLEVER CHOICE GLUCOSE CONTROL) HIGH LIQD by In Vitro route.    . carvedilol (COREG) 12.5 MG tablet Take 1 tablet (12.5 mg total) by mouth 2 (two) times daily with a meal. 60 tablet 0  . Dulaglutide (TRULICITY) 1.5 MG/0.5ML SOPN Inject into the skin.    . furosemide (LASIX) 40 MG tablet Take 1 tablet (40 mg total) by mouth 2 (two) times daily. 60 tablet 0  . hydrALAZINE (APRESOLINE) 25 MG tablet Take 1 tablet (25 mg total) by mouth every 8 (eight) hours. 90 tablet 0  . insulin aspart (NOVOLOG) 100 UNIT/ML injection Inject 10 Units into the skin 3 (three) times daily with meals. 10 mL 0  . insulin detemir (LEVEMIR) 100 UNIT/ML injection Inject 0.37 mLs (37 Units total) into the skin 2 (two) times daily. 10 mL 0  . isosorbide mononitrate (IMDUR) 30 MG 24 hr tablet Take 1 tablet (30 mg total) by mouth daily. 30 tablet 0  . lisinopril (PRINIVIL,ZESTRIL) 5  MG tablet Take 5 mg by mouth daily.     No current facility-administered medications for this visit.    Allergies:   Review of patient's allergies indicates no known allergies.    Social History:  The patient  reports that he has been smoking Cigarettes.  He has a 7.5 pack-year smoking history. He does not have any smokeless tobacco history on file. He reports that he drinks alcohol. He reports that he does not use illicit drugs.   Family History:  The patient's family history is not on file.    ROS:  Please see the history of present illness.   Otherwise, review of systems are positive for none.   All other systems are reviewed and negative.    PHYSICAL EXAM: VS:  BP 124/66 mmHg  Pulse 106  Ht 5' 10.5" (1.791 m)  Wt 145.151 kg (320 lb)  BMI 45.25 kg/m2 , BMI Body mass index is 45.25 kg/(m^2). GEN: Well nourished, well developed, in no  acute distress HEENT: normal Neck: no JVD or masses.  Right carotid bruit Cardiac: RRR; no murmurs, rubs, or gallops,no edema  Respiratory:  clear to auscultation bilaterally, normal work of breathing GI: soft, nontender, nondistended, + BS MS: no deformity or atrophy Skin: warm and dry, no rash Neuro:  Strength and sensation are intact Psych: euthymic mood, full affect   EKG:  EKG is ordered today. The ekg ordered today demonstrates sinus tachycardia at 106bpm with nonspecific T wave abnormality   Recent Labs: 12/30/2014: ALT 30; B Natriuretic Peptide 371.8* 01/08/2015: BUN 68*; Creatinine, Ser 4.01*; Hemoglobin 11.8*; Platelets 330; Potassium 4.3; Sodium 136    Lipid Panel No results found for: CHOL, TRIG, HDL, CHOLHDL, VLDL, LDLCALC, LDLDIRECT    Wt Readings from Last 3 Encounters:  04/12/15 145.151 kg (320 lb)  01/07/15 138.483 kg (305 lb 4.8 oz)  08/03/14 138.347 kg (305 lb)        ASSESSMENT AND PLAN:  1.  Acute systolic CHF s/p recent hospitalization with newly diagnosed DCM of unknown etiology. It was felt to possibly be due to HTN +/= ETOH use (he had been drinking a case of beer a day) and cocaine use.   EF 20%.  He appears euvolemic on exam today. CKD precludes cath at this time.  He has no chest pain currently.  Continue Imdur/hydralazine/BB/Lasix.  He is tachycardic so I will increase the coreg to 25mg  BID. No ACE I or aldactone due to CKD.  Repeat echo in 2 months on medical therapy. 2.  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 3.  Polysubstance abuse - he has been encouraged to stop Alcohol and cocaine and know the effects on the heart.  I encouraged him to wean off cigarettes.   4.  HTN- controlled on BB/hydralazine 5.  Dyslipidemia 6.  CKD with creatinine of 4 at discharge 7.  Excessive daytime sleepiness with morbid obesity and snoring - will get sleep study 8.  Chest pain that is atypical in that is has been constant for 3 months and never  gone away.  This is unlikely due underlying CAD although he does have multiple risk factors.  I will get a Lexiscan myoview to rule out a large area of ischemia.  Given his severe renal disease, would not pursue cath unless there is a large area of ischemia.   9.  Right carotid artery bruit - check carotid dopplers   Current medicines are reviewed at length with the patient today.  The patient does not have concerns regarding medicines.  The following changes have been made:  Increase coreg to  BID  Labs/ tests ordered today: See above Assessment and Plan No orders of the defined types were placed in this encounter.     Disposition:   FU with me in 3 months and with PA in 2 weeks  Signed, Quintella Reichert, MD  04/12/2015 2:07 PM    [facility] [address] [place], [place], [place]  [address] Phone: [phone]; Fax: [phone]

## 2015-04-12 NOTE — Patient Instructions (Signed)
Medication Instructions:  Your physician has recommended you make the following change in your medication:  1) INCREASE COREG to 25 mg TWICE DAILY  Labwork: TODAY: BMET, BNP, ferritin, protein electrophoresis   Testing/Procedures: Your physician has requested that you have an echocardiogram in 2 months. Echocardiography is a painless test that uses sound waves to create images of your heart. It provides your doctor with information about the size and shape of your heart and how well your heart's chambers and valves are working. This procedure takes approximately one hour. There are no restrictions for this procedure.  Your physician has requested that you have a carotid duplex. This test is an ultrasound of the carotid arteries in your neck. It looks at blood flow through these arteries that supply the brain with blood. Allow one hour for this exam. There are no restrictions or special instructions.   Your physician has requested that you have a lexiscan myoview. For further information please visit [email or web address] Please follow instruction sheet, as given.  Your physician has recommended that you have a sleep study. This test records several body functions during sleep, including: brain activity, eye movement, oxygen and carbon dioxide blood levels, heart rate and rhythm, breathing rate and rhythm, the flow of air through your mouth and nose, snoring, body muscle movements, and chest and belly movement.  Follow-Up: Your physician recommends that you schedule a follow-up appointment in 2 WEEKS with an APP.  Your physician recommends that you schedule a follow-up appointment in 3 MONTHS with Dr. Mayford Knife.  Any Other Special Instructions Will Be Listed Below (If Applicable).     If you need a refill on your cardiac medications before your next appointment, please call your pharmacy.

## 2015-04-13 LAB — BRAIN NATRIURETIC PEPTIDE: Brain Natriuretic Peptide: 4.2 pg/mL (ref 0.0–100.0)

## 2015-04-17 ENCOUNTER — Telehealth: Payer: Self-pay

## 2015-04-17 ENCOUNTER — Telehealth (HOSPITAL_COMMUNITY): Payer: Self-pay | Admitting: *Deleted

## 2015-04-17 ENCOUNTER — Ambulatory Visit (HOSPITAL_COMMUNITY): Payer: BLUE CROSS/BLUE SHIELD | Attending: Cardiology

## 2015-04-17 DIAGNOSIS — I517 Cardiomegaly: Secondary | ICD-10-CM | POA: Insufficient documentation

## 2015-04-17 DIAGNOSIS — R9439 Abnormal result of other cardiovascular function study: Secondary | ICD-10-CM | POA: Insufficient documentation

## 2015-04-17 DIAGNOSIS — I10 Essential (primary) hypertension: Secondary | ICD-10-CM | POA: Insufficient documentation

## 2015-04-17 DIAGNOSIS — R7989 Other specified abnormal findings of blood chemistry: Secondary | ICD-10-CM

## 2015-04-17 DIAGNOSIS — R079 Chest pain, unspecified: Secondary | ICD-10-CM | POA: Diagnosis not present

## 2015-04-17 DIAGNOSIS — E119 Type 2 diabetes mellitus without complications: Secondary | ICD-10-CM | POA: Diagnosis not present

## 2015-04-17 DIAGNOSIS — R0602 Shortness of breath: Secondary | ICD-10-CM | POA: Insufficient documentation

## 2015-04-17 DIAGNOSIS — R0609 Other forms of dyspnea: Secondary | ICD-10-CM | POA: Diagnosis not present

## 2015-04-17 LAB — PROTEIN ELECTROPHORESIS, SERUM
ALBUMIN ELP: 3.2 g/dL — AB (ref 3.8–4.8)
ALPHA-1-GLOBULIN: 0.3 g/dL (ref 0.2–0.3)
Alpha-2-Globulin: 0.9 g/dL (ref 0.5–0.9)
Beta 2: 0.5 g/dL (ref 0.2–0.5)
Beta Globulin: 0.4 g/dL (ref 0.4–0.6)
Gamma Globulin: 1.2 g/dL (ref 0.8–1.7)
TOTAL PROTEIN, SERUM ELECTROPHOR: 6.5 g/dL (ref 6.1–8.1)

## 2015-04-17 MED ORDER — REGADENOSON 0.4 MG/5ML IV SOLN
0.4000 mg | Freq: Once | INTRAVENOUS | Status: AC
  Administered 2015-04-17: 0.4 mg via INTRAVENOUS

## 2015-04-17 MED ORDER — TECHNETIUM TC 99M SESTAMIBI GENERIC - CARDIOLITE
32.8000 | Freq: Once | INTRAVENOUS | Status: AC | PRN
  Administered 2015-04-17: 32.8 via INTRAVENOUS

## 2015-04-17 NOTE — Telephone Encounter (Signed)
Patient given detailed instructions per Myocardial Perfusion Study Information Sheet for the test on 04/17/15 at 1245. Patient notified to arrive 15 minutes early and that it is imperative to arrive on time for appointment to keep from having the test rescheduled.  If you need to cancel or reschedule your appointment, please call the office within 24 hours of your appointment. Failure to do so may result in a cancellation of your appointment, and a $50 no show fee. Patient verbalized understanding.Charles Montgomery    

## 2015-04-17 NOTE — Telephone Encounter (Signed)
Informed patient of results and verbal understanding expressed.  Forwarded to PCP. Order placed for hematology referral. Patient agrees with treatment plan.

## 2015-04-17 NOTE — Telephone Encounter (Signed)
-----   Message from Quintella Reichert, MD sent at 04/13/2015  8:10 AM EST ----- Ferritin level increased - please refer to Hematology for further evaluation of possible hemochromatosis.  Forward to PCP

## 2015-04-18 ENCOUNTER — Telehealth: Payer: Self-pay | Admitting: Hematology

## 2015-04-18 ENCOUNTER — Ambulatory Visit (HOSPITAL_COMMUNITY): Payer: BLUE CROSS/BLUE SHIELD | Attending: Cardiovascular Disease

## 2015-04-18 LAB — MYOCARDIAL PERFUSION IMAGING
CHL CUP NUCLEAR SRS: 9
CHL CUP RESTING HR STRESS: 99 {beats}/min
LVDIAVOL: 210 mL
LVSYSVOL: 132 mL
NUC STRESS TID: 1.3
Peak HR: 116 {beats}/min
RATE: 0.27
SDS: 0
SSS: 9

## 2015-04-18 MED ORDER — TECHNETIUM TC 99M SESTAMIBI GENERIC - CARDIOLITE
32.8000 | Freq: Once | INTRAVENOUS | Status: AC | PRN
  Administered 2015-04-18: 32.8 via INTRAVENOUS

## 2015-04-18 NOTE — Telephone Encounter (Signed)
New patient appt-s/w patient and gave np appt for 12/19 @ 11 w/Dr. Mosetta Putt.  Referring Dr. Armanda Magic Dx- elevated ferritin levels

## 2015-04-26 ENCOUNTER — Ambulatory Visit (HOSPITAL_COMMUNITY)
Admission: RE | Admit: 2015-04-26 | Discharge: 2015-04-26 | Disposition: A | Discharge status: Home / Self Care | Payer: BLUE CROSS/BLUE SHIELD | Source: Ambulatory Visit | Attending: Cardiovascular Disease | Admitting: Cardiovascular Disease

## 2015-04-26 DIAGNOSIS — I129 Hypertensive chronic kidney disease with stage 1 through stage 4 chronic kidney disease, or unspecified chronic kidney disease: Secondary | ICD-10-CM | POA: Insufficient documentation

## 2015-04-26 DIAGNOSIS — R0989 Other specified symptoms and signs involving the circulatory and respiratory systems: Secondary | ICD-10-CM

## 2015-04-26 DIAGNOSIS — E11319 Type 2 diabetes mellitus with unspecified diabetic retinopathy without macular edema: Secondary | ICD-10-CM | POA: Diagnosis not present

## 2015-04-26 DIAGNOSIS — E785 Hyperlipidemia, unspecified: Secondary | ICD-10-CM | POA: Diagnosis not present

## 2015-04-26 DIAGNOSIS — N189 Chronic kidney disease, unspecified: Secondary | ICD-10-CM | POA: Diagnosis not present

## 2015-04-26 DIAGNOSIS — E1140 Type 2 diabetes mellitus with diabetic neuropathy, unspecified: Secondary | ICD-10-CM | POA: Insufficient documentation

## 2015-04-26 DIAGNOSIS — I6523 Occlusion and stenosis of bilateral carotid arteries: Secondary | ICD-10-CM | POA: Insufficient documentation

## 2015-04-28 ENCOUNTER — Telehealth: Payer: Self-pay

## 2015-04-28 DIAGNOSIS — I6529 Occlusion and stenosis of unspecified carotid artery: Secondary | ICD-10-CM

## 2015-04-28 NOTE — Telephone Encounter (Signed)
-----   Message from Quintella Reichert, MD sent at 04/27/2015 10:40 PM EST ----- 1-39% bilateral carotid artery stenosis - repeat study in 2 years - he is on apixaban so no ASA

## 2015-04-28 NOTE — Telephone Encounter (Signed)
Informed patient of results and verbal understanding expressed.   Repeat carotids ordered to be scheduled in 2 years. Patient agrees with treatment plan. 

## 2015-05-08 ENCOUNTER — Encounter (HOSPITAL_COMMUNITY): Payer: Self-pay | Admitting: Rehabilitation

## 2015-05-08 ENCOUNTER — Other Ambulatory Visit: Payer: Self-pay

## 2015-05-08 ENCOUNTER — Inpatient Hospital Stay (HOSPITAL_COMMUNITY)
Admission: AD | Admit: 2015-05-08 | Discharge: 2015-05-12 | DRG: 287 | Disposition: A | Discharge status: Home / Self Care | Payer: BLUE CROSS/BLUE SHIELD | Source: Ambulatory Visit | Attending: Cardiology | Admitting: Cardiology

## 2015-05-08 ENCOUNTER — Ambulatory Visit (INDEPENDENT_AMBULATORY_CARE_PROVIDER_SITE_OTHER): Payer: BLUE CROSS/BLUE SHIELD | Admitting: Physician Assistant

## 2015-05-08 ENCOUNTER — Encounter: Payer: Self-pay | Admitting: Physician Assistant

## 2015-05-08 ENCOUNTER — Encounter: Payer: Self-pay | Admitting: Hematology

## 2015-05-08 ENCOUNTER — Ambulatory Visit (HOSPITAL_BASED_OUTPATIENT_CLINIC_OR_DEPARTMENT_OTHER): Payer: BLUE CROSS/BLUE SHIELD | Admitting: Hematology

## 2015-05-08 ENCOUNTER — Other Ambulatory Visit: Payer: Self-pay | Admitting: Physician Assistant

## 2015-05-08 ENCOUNTER — Telehealth: Payer: Self-pay | Admitting: Hematology

## 2015-05-08 VITALS — BP 110/80 | HR 90 | Ht 69.0 in | Wt 330.6 lb

## 2015-05-08 VITALS — BP 131/65 | HR 93 | Temp 98.3°F | Resp 18 | Ht 70.0 in | Wt 326.7 lb

## 2015-05-08 DIAGNOSIS — D649 Anemia, unspecified: Secondary | ICD-10-CM | POA: Diagnosis not present

## 2015-05-08 DIAGNOSIS — Z86718 Personal history of other venous thrombosis and embolism: Secondary | ICD-10-CM

## 2015-05-08 DIAGNOSIS — E785 Hyperlipidemia, unspecified: Secondary | ICD-10-CM | POA: Diagnosis present

## 2015-05-08 DIAGNOSIS — I10 Essential (primary) hypertension: Secondary | ICD-10-CM

## 2015-05-08 DIAGNOSIS — Z720 Tobacco use: Secondary | ICD-10-CM

## 2015-05-08 DIAGNOSIS — Z794 Long term (current) use of insulin: Secondary | ICD-10-CM

## 2015-05-08 DIAGNOSIS — R079 Chest pain, unspecified: Secondary | ICD-10-CM | POA: Diagnosis not present

## 2015-05-08 DIAGNOSIS — N269 Renal sclerosis, unspecified: Secondary | ICD-10-CM | POA: Diagnosis present

## 2015-05-08 DIAGNOSIS — F17210 Nicotine dependence, cigarettes, uncomplicated: Secondary | ICD-10-CM

## 2015-05-08 DIAGNOSIS — R7989 Other specified abnormal findings of blood chemistry: Secondary | ICD-10-CM | POA: Diagnosis not present

## 2015-05-08 DIAGNOSIS — I420 Dilated cardiomyopathy: Secondary | ICD-10-CM | POA: Diagnosis not present

## 2015-05-08 DIAGNOSIS — Z6841 Body Mass Index (BMI) 40.0 and over, adult: Secondary | ICD-10-CM

## 2015-05-08 DIAGNOSIS — F1010 Alcohol abuse, uncomplicated: Secondary | ICD-10-CM

## 2015-05-08 DIAGNOSIS — Z7901 Long term (current) use of anticoagulants: Secondary | ICD-10-CM

## 2015-05-08 DIAGNOSIS — K219 Gastro-esophageal reflux disease without esophagitis: Secondary | ICD-10-CM | POA: Diagnosis present

## 2015-05-08 DIAGNOSIS — I6523 Occlusion and stenosis of bilateral carotid arteries: Secondary | ICD-10-CM | POA: Diagnosis present

## 2015-05-08 DIAGNOSIS — R51 Headache: Secondary | ICD-10-CM | POA: Diagnosis present

## 2015-05-08 DIAGNOSIS — I209 Angina pectoris, unspecified: Secondary | ICD-10-CM

## 2015-05-08 DIAGNOSIS — I5022 Chronic systolic (congestive) heart failure: Secondary | ICD-10-CM | POA: Diagnosis not present

## 2015-05-08 DIAGNOSIS — R0789 Other chest pain: Principal | ICD-10-CM | POA: Diagnosis present

## 2015-05-08 DIAGNOSIS — E11319 Type 2 diabetes mellitus with unspecified diabetic retinopathy without macular edema: Secondary | ICD-10-CM | POA: Diagnosis present

## 2015-05-08 DIAGNOSIS — N184 Chronic kidney disease, stage 4 (severe): Secondary | ICD-10-CM | POA: Diagnosis not present

## 2015-05-08 DIAGNOSIS — E039 Hypothyroidism, unspecified: Secondary | ICD-10-CM | POA: Diagnosis present

## 2015-05-08 DIAGNOSIS — Z7982 Long term (current) use of aspirin: Secondary | ICD-10-CM

## 2015-05-08 DIAGNOSIS — Z79899 Other long term (current) drug therapy: Secondary | ICD-10-CM

## 2015-05-08 DIAGNOSIS — N179 Acute kidney failure, unspecified: Secondary | ICD-10-CM | POA: Diagnosis present

## 2015-05-08 DIAGNOSIS — I471 Supraventricular tachycardia: Secondary | ICD-10-CM | POA: Diagnosis present

## 2015-05-08 DIAGNOSIS — I82509 Chronic embolism and thrombosis of unspecified deep veins of unspecified lower extremity: Secondary | ICD-10-CM | POA: Diagnosis present

## 2015-05-08 DIAGNOSIS — I130 Hypertensive heart and chronic kidney disease with heart failure and stage 1 through stage 4 chronic kidney disease, or unspecified chronic kidney disease: Secondary | ICD-10-CM | POA: Diagnosis present

## 2015-05-08 DIAGNOSIS — E1140 Type 2 diabetes mellitus with diabetic neuropathy, unspecified: Secondary | ICD-10-CM | POA: Diagnosis present

## 2015-05-08 DIAGNOSIS — N189 Chronic kidney disease, unspecified: Secondary | ICD-10-CM | POA: Diagnosis present

## 2015-05-08 LAB — COMPREHENSIVE METABOLIC PANEL WITH GFR
ALT: 14 U/L — ABNORMAL LOW (ref 17–63)
AST: 19 U/L (ref 15–41)
Albumin: 3 g/dL — ABNORMAL LOW (ref 3.5–5.0)
Alkaline Phosphatase: 80 U/L (ref 38–126)
Anion gap: 9 (ref 5–15)
BUN: 41 mg/dL — ABNORMAL HIGH (ref 6–20)
CO2: 28 mmol/L (ref 22–32)
Calcium: 9.1 mg/dL (ref 8.9–10.3)
Chloride: 103 mmol/L (ref 101–111)
Creatinine, Ser: 3.92 mg/dL — ABNORMAL HIGH (ref 0.61–1.24)
GFR calc Af Amer: 19 mL/min — ABNORMAL LOW
GFR calc non Af Amer: 17 mL/min — ABNORMAL LOW
Glucose, Bld: 136 mg/dL — ABNORMAL HIGH (ref 65–99)
Potassium: 4.1 mmol/L (ref 3.5–5.1)
Sodium: 140 mmol/L (ref 135–145)
Total Bilirubin: 0.5 mg/dL (ref 0.3–1.2)
Total Protein: 6.8 g/dL (ref 6.5–8.1)

## 2015-05-08 LAB — CBC WITH DIFFERENTIAL/PLATELET
BASOS ABS: 0 10*3/uL (ref 0.0–0.1)
Basophils Absolute: 0 K/uL (ref 0.0–0.1)
Basophils Relative: 0 %
Basophils Relative: 0 %
EOS PCT: 2 %
Eosinophils Absolute: 0.1 K/uL (ref 0.0–0.7)
Eosinophils Absolute: 0.2 10*3/uL (ref 0.0–0.7)
Eosinophils Relative: 2 %
HCT: 31.6 % — ABNORMAL LOW (ref 39.0–52.0)
HCT: 31.6 % — ABNORMAL LOW (ref 39.0–52.0)
Hemoglobin: 10.3 g/dL — ABNORMAL LOW (ref 13.0–17.0)
Hemoglobin: 10.3 g/dL — ABNORMAL LOW (ref 13.0–17.0)
LYMPHS PCT: 36 %
Lymphocytes Relative: 38 %
Lymphs Abs: 2.5 10*3/uL (ref 0.7–4.0)
Lymphs Abs: 2.6 K/uL (ref 0.7–4.0)
MCH: 26.7 pg (ref 26.0–34.0)
MCH: 26.8 pg (ref 26.0–34.0)
MCHC: 32.6 g/dL (ref 30.0–36.0)
MCHC: 32.6 g/dL (ref 30.0–36.0)
MCV: 81.9 fL (ref 78.0–100.0)
MCV: 82.3 fL (ref 78.0–100.0)
MONO ABS: 0.6 10*3/uL (ref 0.1–1.0)
MONOS PCT: 9 %
Monocytes Absolute: 0.6 K/uL (ref 0.1–1.0)
Monocytes Relative: 9 %
Neutro Abs: 3.6 10*3/uL (ref 1.7–7.7)
Neutro Abs: 3.6 K/uL (ref 1.7–7.7)
Neutrophils Relative %: 51 %
Neutrophils Relative %: 53 %
PLATELETS: 265 10*3/uL (ref 150–400)
Platelets: 269 K/uL (ref 150–400)
RBC: 3.84 MIL/uL — ABNORMAL LOW (ref 4.22–5.81)
RBC: 3.86 MIL/uL — ABNORMAL LOW (ref 4.22–5.81)
RDW: 13.3 % (ref 11.5–15.5)
RDW: 13.3 % (ref 11.5–15.5)
WBC: 6.9 10*3/uL (ref 4.0–10.5)
WBC: 7 K/uL (ref 4.0–10.5)

## 2015-05-08 LAB — COMPREHENSIVE METABOLIC PANEL
ALT: 12 U/L — ABNORMAL LOW (ref 17–63)
ANION GAP: 9 (ref 5–15)
AST: 21 U/L (ref 15–41)
Albumin: 3 g/dL — ABNORMAL LOW (ref 3.5–5.0)
Alkaline Phosphatase: 81 U/L (ref 38–126)
BUN: 42 mg/dL — ABNORMAL HIGH (ref 6–20)
CHLORIDE: 103 mmol/L (ref 101–111)
CO2: 28 mmol/L (ref 22–32)
Calcium: 9.2 mg/dL (ref 8.9–10.3)
Creatinine, Ser: 3.97 mg/dL — ABNORMAL HIGH (ref 0.61–1.24)
GFR, EST AFRICAN AMERICAN: 19 mL/min — AB (ref >60)
GFR, EST NON AFRICAN AMERICAN: 16 mL/min — AB (ref >60)
Glucose, Bld: 135 mg/dL — ABNORMAL HIGH (ref 65–99)
POTASSIUM: 4.1 mmol/L (ref 3.5–5.1)
Sodium: 140 mmol/L (ref 135–145)
TOTAL PROTEIN: 6.6 g/dL (ref 6.5–8.1)
Total Bilirubin: 0.4 mg/dL (ref 0.3–1.2)

## 2015-05-08 LAB — GLUCOSE, CAPILLARY
GLUCOSE-CAPILLARY: 134 mg/dL — AB (ref 65–99)
GLUCOSE-CAPILLARY: 190 mg/dL — AB (ref 65–99)

## 2015-05-08 LAB — BRAIN NATRIURETIC PEPTIDE: B NATRIURETIC PEPTIDE 5: 16.6 pg/mL (ref 0.0–100.0)

## 2015-05-08 LAB — APTT: aPTT: 27 seconds (ref 24–37)

## 2015-05-08 LAB — PROTIME-INR
INR: 1.08 (ref 0.00–1.49)
Prothrombin Time: 14.2 s (ref 11.6–15.2)

## 2015-05-08 LAB — HEPARIN LEVEL (UNFRACTIONATED): HEPARIN UNFRACTIONATED: 0.68 [IU]/mL (ref 0.30–0.70)

## 2015-05-08 LAB — TROPONIN I

## 2015-05-08 LAB — MAGNESIUM: MAGNESIUM: 2.1 mg/dL (ref 1.7–2.4)

## 2015-05-08 MED ORDER — FENOFIBRATE 54 MG PO TABS
54.0000 mg | ORAL_TABLET | Freq: Every day | ORAL | Status: DC
  Administered 2015-05-09 – 2015-05-12 (×4): 54 mg via ORAL
  Filled 2015-05-08 (×4): qty 1

## 2015-05-08 MED ORDER — ASPIRIN 300 MG RE SUPP: 300.0000 mg | RECTAL | Status: DC

## 2015-05-08 MED ORDER — CARVEDILOL 25 MG PO TABS
25.0000 mg | ORAL_TABLET | Freq: Two times a day (BID) | ORAL | Status: DC
  Administered 2015-05-08 – 2015-05-12 (×8): 25 mg via ORAL
  Filled 2015-05-08 (×8): qty 1

## 2015-05-08 MED ORDER — HYDRALAZINE HCL 25 MG PO TABS
25.0000 mg | ORAL_TABLET | Freq: Three times a day (TID) | ORAL | Status: DC
  Administered 2015-05-08 – 2015-05-10 (×6): 25 mg via ORAL
  Filled 2015-05-08 (×6): qty 1

## 2015-05-08 MED ORDER — ASPIRIN 81 MG PO CHEW: 324.0000 mg | CHEWABLE_TABLET | ORAL | Status: DC

## 2015-05-08 MED ORDER — DULAGLUTIDE 1.5 MG/0.5ML ~~LOC~~ SOAJ: 1.5000 mg | SUBCUTANEOUS | Status: DC

## 2015-05-08 MED ORDER — ONDANSETRON HCL 4 MG/2ML IJ SOLN: 4.0000 mg | Freq: Four times a day (QID) | INTRAMUSCULAR | Status: DC | PRN

## 2015-05-08 MED ORDER — ASPIRIN EC 81 MG PO TBEC: 81.0000 mg | DELAYED_RELEASE_TABLET | Freq: Every day | ORAL | Status: DC

## 2015-05-08 MED ORDER — ACETAMINOPHEN 325 MG PO TABS
650.0000 mg | ORAL_TABLET | ORAL | Status: DC | PRN
  Administered 2015-05-09: 650 mg via ORAL
  Filled 2015-05-08: qty 2

## 2015-05-08 MED ORDER — INSULIN ASPART 100 UNIT/ML ~~LOC~~ SOLN: 0.0000 [IU] | Freq: Every day | SUBCUTANEOUS | Status: DC

## 2015-05-08 MED ORDER — ASPIRIN EC 81 MG PO TBEC
81.0000 mg | DELAYED_RELEASE_TABLET | Freq: Every day | ORAL | Status: DC
  Administered 2015-05-09 – 2015-05-10 (×2): 81 mg via ORAL
  Filled 2015-05-08 (×2): qty 1

## 2015-05-08 MED ORDER — NITROGLYCERIN 0.4 MG SL SUBL: 0.4000 mg | SUBLINGUAL_TABLET | SUBLINGUAL | Status: DC | PRN

## 2015-05-08 MED ORDER — INSULIN ASPART 100 UNIT/ML ~~LOC~~ SOLN
30.0000 [IU] | Freq: Three times a day (TID) | SUBCUTANEOUS | Status: DC
  Administered 2015-05-08 – 2015-05-11 (×7): 30 [IU] via SUBCUTANEOUS

## 2015-05-08 MED ORDER — ISOSORBIDE MONONITRATE ER 30 MG PO TB24
30.0000 mg | ORAL_TABLET | Freq: Every day | ORAL | Status: DC
  Administered 2015-05-09 – 2015-05-12 (×4): 30 mg via ORAL
  Filled 2015-05-08 (×4): qty 1

## 2015-05-08 MED ORDER — INSULIN ASPART 100 UNIT/ML ~~LOC~~ SOLN
0.0000 [IU] | Freq: Three times a day (TID) | SUBCUTANEOUS | Status: DC
  Administered 2015-05-09 (×2): 3 [IU] via SUBCUTANEOUS
  Administered 2015-05-10 (×2): 2 [IU] via SUBCUTANEOUS
  Administered 2015-05-10: 5 [IU] via SUBCUTANEOUS
  Administered 2015-05-11 – 2015-05-12 (×2): 2 [IU] via SUBCUTANEOUS

## 2015-05-08 MED ORDER — HEPARIN (PORCINE) IN NACL 100-0.45 UNIT/ML-% IJ SOLN
2300.0000 [IU]/h | INTRAMUSCULAR | Status: DC
  Administered 2015-05-08: 1600 [IU]/h via INTRAVENOUS
  Administered 2015-05-09: 1900 [IU]/h via INTRAVENOUS
  Filled 2015-05-08 (×3): qty 250

## 2015-05-08 MED ORDER — INSULIN DETEMIR 100 UNIT/ML ~~LOC~~ SOLN
50.0000 [IU] | Freq: Two times a day (BID) | SUBCUTANEOUS | Status: DC
  Administered 2015-05-08 – 2015-05-12 (×8): 50 [IU] via SUBCUTANEOUS
  Filled 2015-05-08 (×9): qty 0.5

## 2015-05-08 NOTE — Assessment & Plan Note (Signed)
Smoking cessation recommended 

## 2015-05-08 NOTE — Progress Notes (Signed)
ANTICOAGULATION CONSULT NOTE - Initial Consult  Pharmacy Consult for heparin while apixaban on hold for possible cath Indication: L leg DVT  No Known Allergies  Patient Measurements: Height:  (175.3 cm) Weight: (!) 322 lb 11.2 oz (146.376 kg) IBW/kg (Calculated) : 70.7 Heparin Dosing Weight: 105.8 kg  Vital Signs: Temp: 98 F (36.7 C) (12/19 1427) Temp Source: Oral (12/19 1056) BP: 109/68 mmHg (12/19 1427) Pulse Rate: 92 (12/19 1427)  Labs: No results for input(s): HGB, HCT, PLT, APTT, LABPROT, INR, HEPARINUNFRC, CREATININE, CKTOTAL, CKMB, TROPONINI in the last 72 hours.  CrCl cannot be calculated (Patient has no serum creatinine result on file.).   Medical History: Past Medical History  Diagnosis Date  . Diabetes mellitus   . Hypertension   . High cholesterol   . GERD (gastroesophageal reflux disease)   . Hypothyroid   . Obesity   . Diabetic neuropathy (HCC)   . Hyperlipidemia   . Diabetic retinopathy of left eye (HCC)   . Systolic heart failure (HCC)   . CKD (chronic kidney disease)   . Leg DVT (deep venous thromboembolism), chronic (HCC)   . Nephrotic syndrome   . Fatigue   . Snores   . Cigarette nicotine dependence, uncomplicated   . 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 DCM (dilated cardiomyopathy) (HCC) 04/12/2015    Medications:  Prescriptions prior to admission  Medication Sig Dispense Refill Last Dose  . apixaban (ELIQUIS) 2.5 MG TABS tablet Take 2.5 mg by mouth 2 (two) times daily.   05/08/2015 at Unknown time  . aspirin EC 81 MG tablet Take 81 mg by mouth daily.   05/08/2015 at Unknown time  . carvedilol (COREG) 25 MG tablet Take 1 tablet (25 mg total) by mouth 2 (two) times daily with a meal. 60 tablet 11 05/08/2015 at 0930  . fenofibrate (TRICOR) 48 MG tablet Take 48 mg by mouth 3 (three) times daily. Reported on 05/08/2015   05/08/2015 at Unknown time  . furosemide (LASIX) 40 MG tablet Take 1  tablet (40 mg total) by mouth 2 (two) times daily. 60 tablet 0 05/08/2015 at 0930  . hydrALAZINE (APRESOLINE) 25 MG tablet Take 1 tablet (25 mg total) by mouth every 8 (eight) hours. 90 tablet 0 05/08/2015 at Unknown time  . insulin aspart (NOVOLOG) 100 UNIT/ML injection Inject 30 Units into the skin 3 (three) times daily before meals.   05/08/2015 at Unknown time  . insulin detemir (LEVEMIR) 100 UNIT/ML injection Inject 50 Units into the skin 2 (two) times daily.    05/08/2015 at Unknown time  . isosorbide mononitrate (IMDUR) 30 MG 24 hr tablet Take 1 tablet (30 mg total) by mouth daily. 30 tablet 0 05/08/2015 at Unknown time  . KLOR-CON M20 20 MEQ tablet Take 20 mEq by mouth daily.    05/08/2015 at Unknown time  . lisinopril (PRINIVIL,ZESTRIL) 5 MG tablet Take 5 mg by mouth daily.   05/07/2015 at Unknown time  . Blood Glucose Calibration (CLEVER CHOICE GLUCOSE CONTROL) HIGH LIQD by In Vitro route.   Taking  . Dulaglutide (TRULICITY) 1.5 MG/0.5ML SOPN Inject 1.5 mg into the skin once a week.    05/03/2015    Assessment: 49 yo M on apixaban 2.5 mg po bid for hx L leg DVT.  Pharmacy consulted to dose heparin as bridge therapy while apixaban on hold for possible cardiac cath.   He stated he took his last apixaban 2.5 mg dose this morning at 09:30 am.  Baseline labs have not yet been drawn.  His INR will be elevated due to the apixaban but NOT indicative of his anticoagulation status..  Goal of Therapy:  aPTT 66-102 seconds Monitor platelets by anticoagulation protocol: Yes   Plan:  - dc sq heparin 5000 units q8h - draw baseline aPTT and HL - start heparin 12 hours after last dose of apixaban at 2200 tonight with no bolus at rate of 1600 units/hr - check aPTT and HL daily until apixaban effect has worn off - daily CBC  Herby Abraham, Pharm.D. [phone] 05/08/2015 4:07 PM

## 2015-05-08 NOTE — Assessment & Plan Note (Signed)
Patient says he quit drinking alcohol

## 2015-05-08 NOTE — Progress Notes (Signed)
Paged on-call doctor for patient requesting a nicotene patch for he states if not he will go down to smoke(Pt appeared to be joking when stating but do not want to wait for that to occur in case he did. Awaiting return page.

## 2015-05-08 NOTE — Patient Instructions (Signed)
Medication Instructions:    CONTINUE SAME MEDICATIONS     If you need a refill on your cardiac medications before your next appointment, please call your pharmacy.  Labwork: NONE ORDER TODAY'   Testing/Procedures: NONE ORDER TODAY    Follow-Up:  YOU ARE BEING ADMITTING TODAY  TO THE [facility] AT [facility]    Any Other Special Instructions Will Be Listed Below (If Applicable).

## 2015-05-08 NOTE — Assessment & Plan Note (Signed)
Blood Pressure stable 

## 2015-05-08 NOTE — Assessment & Plan Note (Signed)
As creatinine 3.9. Followed by Dr. Lowell Guitar. To be seen in the hospital.

## 2015-05-08 NOTE — Assessment & Plan Note (Signed)
No Heart Failure and exam.

## 2015-05-08 NOTE — Assessment & Plan Note (Signed)
Patient is having constant chest pain that's worse with exertion. He had a nuclear stress test that is felt to be intermediate risk with a large severe inferolateral defect consistent with prior infarct and moderate ischemia EF 37% with global hypokinesis. I discussed this patient with Dr. Mayford Knife and Dr.Nahser concur that he needs to be admitted to the hospital. He is at high risk for acute renal failure and dialysis if he undergoes cardiac catheterization but he also has a high risk stress Myoview and regular chest pain. He will be admitted for IV hydration, renal consult and possible cardiac catheterization. Patient understands and is agreeable.

## 2015-05-08 NOTE — Progress Notes (Signed)
[name]  Telephone:([phone]) [phone] Fax:([phone]) [place] consult Note   Patient Care Team: Wenda Low, MD as PCP - General (Internal Medicine) Estanislado Emms, MD as Consulting Physician (Nephrology) 05/08/2015  REFERRAL PHYSICIAN: Dr. Radford Pax   CHIEF COMPLAINTS/PURPOSE OF CONSULTATION:  Elevated ferritin   HISTORY OF PRESENTING ILLNESS:  Charles Montgomery 49 y.o. male is referred by his cardiologist Dr. Radford Pax for elevated ferritin found on blood testing on 04/12/15.   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   He does not have history of liver disease and had normal appearing CT abdomen August of 2016. His liver function tests were normal in August of 2016 and was negative for acute hepatitis. He reports history of daily alcohol use but has since quit drinking. He denies family history of hemochromatosis and reports his mother died of leukemia at age 42. He had new-onset heart and kidney failure in August of this year. He had kidney biopsy on 01/02/15 which revealed acute collapsing form of FSGS with negative autoimmune work-up for ANCA and immune complex mediated vasculitis. He also tested negative for multiple myeloma and cryoglobulinemia. He completed a course of prednisone therapy. He follows with nephrologist Dr. Florene Glen. He had evidence of dilated cardiomyopathy on 2D-echo on 01/01/15 that revealed EF 20-25%. Cardiac nuclear stress testing on 01/02/15 was intermediate risk and is to have possible stress testing vs catherization after discussion with his cardiologist today. He had normal VQ scan on 01/02/15 and normal chest xray on 01/01/15.   He has history of  microcytosis without anemia first seen on CBC on 12/17/14 with progression to microcytic anemia in August of 2016 with baseline Hg 11. He denies prior history of anemia, blood transfusion, or current/past use of oral iron. He denies hematochezia, melena, epistaxis, hematuria, or bruising. He was diagnosed with left LE DVT in August of this year in setting of nephrotic syndrome and was started on coumadin but is now taking eliquis. He is also on 81 mg daily of aspirin. He denies NSAID use. He has never had colonoscopy and denies family history of colon cancer.   He reports chronic fatigue, dyspnea with exertion, and anginal symptoms but denies palpitations, joint pain, arthritis, skin bronzing, sexual dysfunction, LE edema, muscle cramping, or weight change. He lives alone independently, functions well at home, not physically active due to his dyspnea on exertion. He is on disability.     MEDICAL HISTORY:  Past Medical History  Diagnosis Date  . Diabetes mellitus   . Hypertension   . High cholesterol   . GERD (gastroesophageal reflux disease)   . Hypothyroid   . Obesity   . Diabetic neuropathy ([place])   . Hyperlipidemia   . Diabetic retinopathy of left eye ([place])   . Systolic heart failure ([place])   . CKD (chronic kidney disease)   . Leg DVT (deep venous thromboembolism), chronic ([place])   . Nephrotic syndrome   . Fatigue   . Snores   . Cigarette nicotine dependence, uncomplicated   . 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 DCM (dilated cardiomyopathy) ([place]) 04/12/2015    SURGICAL HISTORY: No past surgical history on file.  SOCIAL HISTORY: Social History  Social History  . Marital Status: Married    Spouse Name: N/A  . Number of Children: N/A  . Years of Education: N/A   Occupational History  . Not on file.   Social History Main Topics  . Smoking status: Current Every Day Smoker -- 0.25 packs/day for 30 years    Types: Cigarettes  .  Smokeless tobacco: Not on file  . Alcohol Use: Yes     Comment: rarely  . Drug Use: No  . Sexual Activity: Not on file   Other Topics Concern  . Not on file   Social History Narrative    FAMILY HISTORY: No family history on file.  ALLERGIES:  has No Known Allergies.  MEDICATIONS:  Current Outpatient Prescriptions  Medication Sig Dispense Refill  . apixaban (ELIQUIS) 2.5 MG TABS tablet Take 2.5 mg by mouth 2 (two) times daily.    Marland Kitchen aspirin EC 81 MG tablet Take 81 mg by mouth daily.    . Blood Glucose Calibration (CLEVER CHOICE GLUCOSE CONTROL) HIGH LIQD by In Vitro route.    . carvedilol (COREG) 25 MG tablet Take 1 tablet (25 mg total) by mouth 2 (two) times daily with a meal. 60 tablet 11  . Dulaglutide (TRULICITY) 1.5 TM/2.2QJ SOPN Inject into the skin.    . furosemide (LASIX) 40 MG tablet Take 1 tablet (40 mg total) by mouth 2 (two) times daily. 60 tablet 0  . hydrALAZINE (APRESOLINE) 25 MG tablet Take 1 tablet (25 mg total) by mouth every 8 (eight) hours. 90 tablet 0  . insulin aspart (NOVOLOG) 100 UNIT/ML injection Inject 10 Units into the skin 3 (three) times daily with meals. (Patient taking differently: Inject 30 Units into the skin 3 (three) times daily with meals. HUMALOG) 10 mL 0  . insulin detemir (LEVEMIR) 100 UNIT/ML injection Inject 0.37 mLs (37 Units total) into the skin 2 (two) times daily. (Patient taking differently: Inject 3,750 Units into the skin 2 (two) times daily. ) 10 mL 0  . isosorbide mononitrate (IMDUR) 30 MG 24 hr tablet Take 1 tablet (30 mg total) by mouth daily. 30 tablet 0  . KLOR-CON M20 20 MEQ tablet Take 20 mEq by mouth daily.     Marland Kitchen lisinopril (PRINIVIL,ZESTRIL) 5 MG tablet Take 5 mg by mouth daily.    . fenofibrate (TRICOR) 48 MG tablet Take 48 mg by mouth 3 (three) times daily. Reported on 05/08/2015     No current facility-administered medications for this visit.    REVIEW OF SYSTEMS:   Constitutional: Fatigue. Denies fevers, chills or  abnormal night sweats Eyes: Headache and chronic blurry. Denies double vision or watery eyes Ears, nose, mouth, throat, and face: Denies mucositis or sore throat. Decrease in taste.  Respiratory: Denies cough, dyspnea or wheezes Cardiovascular: Chest tightness with exertion. Denies palpitation, chest discomfort or lower extremity swelling Gastrointestinal: Chronic constipation and nausea/vomiting every other day since August.  Denies heartburn. Skin: Denies abnormal skin rashes Lymphatics: Denies new lymphadenopathy or easy bruising Neurological:Denies numbness, tingling or new weaknesses Behavioral/Psych: Mood is stable, no new changes  All other systems were reviewed with the patient and are negative.  PHYSICAL EXAMINATION: ECOG PERFORMANCE STATUS: 2 - Symptomatic, <50% confined to bed  Filed Vitals:   05/08/15 1056  BP: 131/65  Pulse: 93  Temp: 98.3 F (36.8 C)  Resp: 18   Filed Weights   05/08/15 1056  Weight: 326 lb 11.2 oz (148.19 kg)    GENERAL:alert, no distress and comfortable, obese  SKIN: dry skin, hyperpigmentation on forehead, no bronzing of skin  EYES: normal, conjunctiva are pink and non-injected, sclera clear OROPHARYNX:no exudate, no erythema and lips, buccal mucosa, and tongue normal  NECK: supple, thyroid normal size, non-tender, without nodularity LYMPH:  no palpable lymphadenopathy in the cervical, axillary or inguinal LUNGS: clear to auscultation and percussion with normal breathing effort HEART: regular rate & rhythm and no murmurs and no lower extremity edema ABDOMEN:abdomen soft, non-tender and normal bowel sounds Musculoskeletal:no cyanosis of digits and no clubbing  PSYCH: alert & oriented x 3 with fluent speech NEURO: no focal motor/sensory deficits  LABORATORY DATA:  I have reviewed the data as listed CBC Latest Ref Rng 01/08/2015 01/07/2015 01/06/2015  WBC 4.0 - 10.5 K/uL 10.9(H) 8.5 6.4  Hemoglobin 13.0 - 17.0 g/dL 11.8(L) 10.8(L) 11.9(L)    Hematocrit 39.0 - 52.0 % 35.8(L) 31.8(L) 35.3(L)  Platelets 150 - 400 K/uL 330 251 257    CMP Latest Ref Rng 04/12/2015 01/08/2015 01/07/2015  Glucose 65 - 99 mg/dL 181(H) 193(H) 387(H)  BUN 7 - 25 mg/dL 56(H) 68(H) 69(H)  Creatinine 0.60 - 1.35 mg/dL 3.90(H) 4.01(H) 4.34(H)  Sodium 135 - 146 mmol/L 140 136 132(L)  Potassium 3.5 - 5.3 mmol/L 3.9 4.3 4.7  Chloride 98 - 110 mmol/L 105 104 103  CO2 20 - 31 mmol/L 23 23 18(L)  Calcium 8.6 - 10.3 mg/dL 9.0 8.7(L) 8.1(L)  Total Protein 6.5 - 8.1 g/dL - - -  Total Bilirubin 0.3 - 1.2 mg/dL - - -  Alkaline Phos 38 - 126 U/L - - -  AST 15 - 41 U/L - - -  ALT 17 - 63 U/L - - -     RADIOGRAPHIC STUDIES: I have personally reviewed the radiological images as listed and agreed with the findings in the report.  CT abdomen and pelvis without contrast 12/31/2014 IMPRESSION: 1. Bilateral perinephric stranding and fluid, with stranding tracking along the courses of the ureters, and retroperitoneal stranding. This is concerning for bilateral pyelonephritis. Would correlate for associated symptoms and lab findings. No evidence of hydronephrosis. 2. Mild bibasilar atelectasis noted.   ASSESSMENT & PLAN: 49 year old African-American male, with multiple comorbidities, presents for evaluation of elevated ferritin on lab test.  1. Elevated ferritin level  - He has Mildly elevated ferritin level of 407 on 04/12/15 -I think this is likely related to chronic inflammation from CHF, DM etc -Hemochromatosis is less likely, but need to be ruled out.  -Obtain ferritin, iron level, transferrin saturation, CBC, and CMP  -If iron level and transferrin saturation level low, essentially will rules out hemochromatosis  -If iron level and transferrin staturation elevated (>55%), will need HFE testing   2. Chronic Microcytic Anemia, likely secondary to CKD, rule out iron deficiency  - Pt with new-onset microcytic anemia since August of 2016 most likely in setting  of new-onset renal failure. Pt denies GI or GU bleeding. He is on chronic AC therapy for left LE DVT. Pt with stable Hg of 11 in August of 2016. -Will obtain past lab records (CBC) from his PCP Dr. Lysle Rubens  -Obtain CBC, ferritin, reticulocyte, iron level, epo, TSH, and transferrin saturation -if lab evidence of iron deficiency, will recommend oral or IV iron supplementation in setting of CKD Stage 4 -Monitor CBC, if Hg<10 consider starting ESA -Consider FOBT to evaluate for chronic GI blood loss   3. Provoked Left LE DVT  - Diagnosed in August of 2016 in setting of hypercoagulability of nephrotic syndrome.  -Continue  eliquis per PCP, he is at risk for both arterial and venous thromboemboli   4. Chronic Systolic CHF in setting of dilated cardiomyopathy  - 2D-echo on 01/01/15 revealed EF 20-25%. Cardiac nuclear stress testing on 01/02/15 was intermediate risk. Pt with stable anginal symptoms.  -Follows with cardiologist Dr. Radford Pax  -Pt to have further ischemic work-up for intermediate risk Myoview   5. 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 Pt negative for SLE, hepatitis C, HIV infection, and multiple myeloma. Pt was on steroid therapy but no longer. -Follows with nephrologist Dr. Florene Glen  -Monitor CBC, if Hg<10 consider starting ESA  6. Uncontrolled Insulin-dependent Type 2 DM - Last A1c 12.5 on 12/31/14. Pt on levemir, novolog, and trulicity. -A1c not at goal <7, continue insulin therapy per PCP -Pt counseled on following a healthy diet and exercising regularly   7. Hypertension - Currently normotensive.  -Continue imdur, lisinopril, lasix, and coreg per PCP  8. Hyerlipidemia  - Pt with nephrotic syndrome. No recent lipid panel. -Continue fenofibrate per PCP  9. Tobacco Use - Pt is current day smoker, 0.2 packs with 30 year history. -Pt counseled on tobacco cessation   10. Morbid obesity - BMI 46.88.    -Pt encouraged to lose weight  Plan -lab in the next few days -I will call him with lab result and let him know if he needs further test such as HFE genetic testing, and if he needs oral iron  -RTC in one month with lab    All questions were answered. The patient knows to call the clinic with any problems, questions or concerns. I spent 35 minutes counseling the patient face to face. The total time spent in the appointment was 40 minutes and more than 50% was on counseling.     Truitt Merle, MD 05/08/2015 11:49 AM

## 2015-05-08 NOTE — Consult Note (Signed)
Charles Montgomery is an 49 y.o. male referred by Dr Mayford Knife   Chief Complaint: CKD 4 HPI: 49yo BM admitted for atypical CP and abnormal stress test.  Has CKD 4 secondary to bx proven collapsing FSGS.  Baseline Scr mid 3's to 4.  Asked to see now to discuss risks of heart cath.  Tx with course of steroids but has been weaned off.  Has been on lisinopril with last dose yest.  He is aware of his poor renal prognosis though Scr has been stable as of late. Edema controlled on small dose lasix.  Denies recent use of cocaine.  Past Medical History  Diagnosis Date  . Diabetes mellitus   . Hypertension   . High cholesterol   . GERD (gastroesophageal reflux disease)   . Hypothyroid   . Obesity   . Diabetic neuropathy (HCC)   . Hyperlipidemia   . Diabetic retinopathy of left eye (HCC)   . Systolic heart failure (HCC)   . CKD (chronic kidney disease)   . Leg DVT (deep venous thromboembolism), chronic (HCC)   . Nephrotic syndrome   . Fatigue   . Snores   . Cigarette nicotine dependence, uncomplicated   . 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 DCM (dilated cardiomyopathy) (HCC) 04/12/2015    No past surgical history on file.  No family history on file. Father with ESRD and SP renal Tx.  Says cause was not FSGS but does not know the actual cause.  Social History:  reports that he has been smoking Cigarettes.  He has a 7.5 pack-year smoking history. He does not have any smokeless tobacco history on file. He reports that he drinks alcohol. He reports that he does not use illicit drugs.  Separated.  Works as Curator.  Allergies: No Known Allergies  Medications Prior to Admission  Medication Sig Dispense Refill  . apixaban (ELIQUIS) 2.5 MG TABS tablet Take 2.5 mg by mouth 2 (two) times daily.    Marland Kitchen aspirin EC 81 MG tablet Take 81 mg by mouth daily.    . carvedilol (COREG) 25 MG tablet Take 1 tablet (25 mg total) by mouth 2 (two) times daily with a meal.  60 tablet 11  . fenofibrate (TRICOR) 48 MG tablet Take 48 mg by mouth 3 (three) times daily. Reported on 05/08/2015    . furosemide (LASIX) 40 MG tablet Take 1 tablet (40 mg total) by mouth 2 (two) times daily. 60 tablet 0  . hydrALAZINE (APRESOLINE) 25 MG tablet Take 1 tablet (25 mg total) by mouth every 8 (eight) hours. 90 tablet 0  . insulin aspart (NOVOLOG) 100 UNIT/ML injection Inject 30 Units into the skin 3 (three) times daily before meals.    . insulin detemir (LEVEMIR) 100 UNIT/ML injection Inject 50 Units into the skin 2 (two) times daily.     . isosorbide mononitrate (IMDUR) 30 MG 24 hr tablet Take 1 tablet (30 mg total) by mouth daily. 30 tablet 0  . KLOR-CON M20 20 MEQ tablet Take 20 mEq by mouth daily.     Marland Kitchen lisinopril (PRINIVIL,ZESTRIL) 5 MG tablet Take 5 mg by mouth daily.    . Blood Glucose Calibration (CLEVER CHOICE GLUCOSE CONTROL) HIGH LIQD by In Vitro route.    . Dulaglutide (TRULICITY) 1.5 MG/0.5ML SOPN Inject 1.5 mg into the skin once a week.        Lab Results: UA: ND  No results for input(s): WBC, HGB, HCT, PLT in the last 72  hours. BMET No results for input(s): NA, K, CL, CO2, GLUCOSE, BUN, CREATININE, CALCIUM, PHOS in the last 72 hours.  Invalid input(s): MAG LFT No results for input(s): PROT, ALBUMIN, AST, ALT, ALKPHOS, BILITOT, BILIDIR, IBILI in the last 72 hours. No results found.  ROS: No change in vision + SOB Vague int CP + constipation No edema No arthritic CO No neuropathic Sxs  PHYSICAL EXAM: Blood pressure 109/68, pulse 92, temperature 98 F (36.7 C), resp. rate 18, height  (1.753 m), weight 146.376 kg (322 lb 11.2 oz), SpO2 99 %. HEENT: PERRLA EOMI NECK:No JVD LUNGS:Clear CARDIAC:RRR wo MRG ABD:+ BS NTND No HSM EXT:No edema NEURO:CNI Ox3 no asterixis  Assessment: 1. CKD 4 sec collapsing FSGS 2. Mild anemia not requiring ESA 3. Hx DVT 4. Cardiomyopathy 5. Sec HPTH last PTH 160 on 01/31/15 6. DM  PLAN: 1. Discussed risk of  heart cath and ARF.  His risk of worsening renal fx is 30-40% but this could improve without need for HD.  Risk of HD 10-15% but even if this were to occur there is a chance of recovery.  Pt understands risk and is willing to proceed. 2. IV hydration pre cath and use least amount possible.  Will also hold his lisinopril  3. Daily Scr post cath 4. Cont rest of meds except for eliquis (and lisinopril)   Maybree Riling T 05/08/2015, 3:26 PM

## 2015-05-08 NOTE — Progress Notes (Signed)
Cardiology Office Note   Date:  05/08/2015   ID:  Charles Montgomery, DOB 12-07-1965, MRN [id number]  PCP:  Georgann Housekeeper, MD  Cardiologist: Dr. Mayford Knife  Chief Complaint: chest Pain    History of Present Illness: Charles Montgomery is a 49 y.o. male who presents for follow-up of heart failure. As a history of newly diagnosed dilated cardiomyopathy of unknown etiology, possibly due to hypertension and or EtOH and cocaine use. EF 20%. CK D precludes cath. He has had no chest pain. He also has hypertension, dyslipidemia, CK D with a creatinine of 4 and atypical chest pain.  She was seen by Dr. Mayford Knife in 04/12/15. Carotid Dopplers were ordered and showed 1-39% bilateral carotid artery stenosis. Rest Myoview showed moderately decreased EF of 30-44% nuclear stress 37% there was no ST segment deviation during stress there was a need intermediate study with a large severe partially reversible inferior lateral defect consistent with prior infarct and moderate peri-infarct ischemia EF 37% with global hypokinesis mild LVE.  Patient quite drinking and doing cocaine. He has back on his smoking but is still smoking about half pack a day. He complains of constant chest tightness which is worse with little exertion. It has not improved on the medications. I discussed this patient with Dr. Mayford Knife and Dr.Nahser both recommend admission with hydration, renal consult and possible cardiac catheterization.    Past Medical History  Diagnosis Date  . Diabetes mellitus   . Hypertension   . High cholesterol   . GERD (gastroesophageal reflux disease)   . Hypothyroid   . Obesity   . Diabetic neuropathy (HCC)   . Hyperlipidemia   . Diabetic retinopathy of left eye (HCC)   . Systolic heart failure (HCC)   . CKD (chronic kidney disease)   . Leg DVT (deep venous thromboembolism), chronic (HCC)   . Nephrotic syndrome   . Fatigue   . Snores   . Cigarette nicotine dependence, uncomplicated   . 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 DCM (dilated cardiomyopathy) (HCC) 04/12/2015    No past surgical history on file.   Current Outpatient Prescriptions  Medication Sig Dispense Refill  . apixaban (ELIQUIS) 2.5 MG TABS tablet Take 2.5 mg by mouth 2 (two) times daily.    Marland Kitchen aspirin EC 81 MG tablet Take 81 mg by mouth daily.    . Blood Glucose Calibration (CLEVER CHOICE GLUCOSE CONTROL) HIGH LIQD by In Vitro route.    . carvedilol (COREG) 25 MG tablet Take 1 tablet (25 mg total) by mouth 2 (two) times daily with a meal. 60 tablet 11  . Dulaglutide (TRULICITY) 1.5 MG/0.5ML SOPN Inject 1.5 mg into the skin once a week.     . fenofibrate (TRICOR) 48 MG tablet Take 48 mg by mouth 3 (three) times daily. Reported on 05/08/2015    . furosemide (LASIX) 40 MG tablet Take 1 tablet (40 mg total) by mouth 2 (two) times daily. 60 tablet 0  . hydrALAZINE (APRESOLINE) 25 MG tablet Take 1 tablet (25 mg total) by mouth every 8 (eight) hours. 90 tablet 0  . insulin aspart (NOVOLOG) 100 UNIT/ML injection Inject 30 Units into the skin 3 (three) times daily before meals.    . insulin detemir (LEVEMIR) 100 UNIT/ML injection Inject 50 Units into the skin 2 (two) times daily.     . isosorbide mononitrate (IMDUR) 30 MG 24 hr tablet Take 1 tablet (30 mg total) by mouth daily. 30 tablet 0  . KLOR-CON M20  20 MEQ tablet Take 20 mEq by mouth daily.     Marland Kitchen lisinopril (PRINIVIL,ZESTRIL) 5 MG tablet Take 5 mg by mouth daily.     No current facility-administered medications for this visit.    Allergies:   Review of patient's allergies indicates no known allergies.    Social History:  The patient  reports that he has been smoking Cigarettes.  He has a 7.5 pack-year smoking history. He does not have any smokeless tobacco history on file. He reports that he drinks alcohol. He reports that he does not use illicit drugs.   Family History:  The patient's    family history is not on file.     ROS:  Please see the history of present illness.   Otherwise, review of systems are positive for none.   All other systems are reviewed and negative.    PHYSICAL EXAM: VS:  BP 110/80 mmHg  Pulse 90  Ht 5\' 9"  (1.753 m)  Wt 330 lb 9.6 oz (149.959 kg)  BMI 48.80 kg/m2 , BMI Body mass index is 48.8 kg/(m^2). GEN: Obese, in no acute distress Neck: no JVD, HJR, carotid bruits, or masses Cardiac: RRR; no murmurs,gallop, rubs, thrill or heave,  Respiratory:  clear to auscultation bilaterally, normal work of breathing GI: soft, nontender, nondistended, + BS MS: no deformity or atrophy Extremities: without cyanosis, clubbing, edema, good distal pulses bilaterally.  Skin: warm and dry, no rash Neuro:  Strength and sensation are intact    EKG:  EKG is ordered today. The ekg ordered today demonstrates normal sinus rhythm, no acute change Recent Labs: 12/30/2014: ALT 30; B Natriuretic Peptide 371.8* 01/08/2015: Hemoglobin 11.8*; Platelets 330 04/12/2015: BUN 56*; Creat 3.90*; Potassium 3.9; Sodium 140    Lipid Panel No results found for: CHOL, TRIG, HDL, CHOLHDL, VLDL, LDLCALC, LDLDIRECT    Wt Readings from Last 3 Encounters:  05/08/15 330 lb 9.6 oz (149.959 kg)  05/08/15 326 lb 11.2 oz (148.19 kg)  04/17/15 320 lb (145.151 kg)      Other studies Reviewed: Additional studies/ records that were reviewed today include and review of the records demonstrates:   Study Highlights      The left ventricular ejection fraction is moderately decreased (30-44%).  Nuclear stress EF: 37%.  There was no ST segment deviation noted during stress.  This is an intermediate risk study.   Intermediate risk stress nuclear study with a large, severe, partially reversible inferior lateral defect consistent with prior infarct and moderate peri-infarct ischemia; EF 37 with global hypokinesis; mild LVE.       ASSESSMENT AND PLAN:  Chest pain Patient is having constant chest pain that's worse  with exertion. He had a nuclear stress test that is felt to be intermediate risk with a large severe inferolateral defect consistent with prior infarct and moderate ischemia EF 37% with global hypokinesis. I discussed this patient with Dr. Mayford Knife and Dr.Nahser concur that he needs to be admitted to the hospital. He is at high risk for acute renal failure and dialysis if he undergoes cardiac catheterization but he also has a high risk stress Myoview and regular chest pain. He will be admitted for IV hydration, renal consult and possible cardiac catheterization. Patient understands and is agreeable.  CKD (chronic kidney disease) As creatinine 3.9. Followed by Dr. Lowell Guitar. To be seen in the hospital.  Hypertension Blood Pressure stable  Systolic heart failure (HCC) No Heart Failure and exam.  Cigarette nicotine dependence, uncomplicated Smoking cessation recommended  ETOH abuse  Patient says he quit drinking alcohol    Signed, Jacolyn Reedy, PA-C  05/08/2015 1:04 PM    [facility] [address] [place], [place], [place]  [address] Phone: [phone]; Fax: [phone]

## 2015-05-08 NOTE — Telephone Encounter (Signed)
Requested last office note and lab for 3 yrs from pcp Donette Larry)

## 2015-05-08 NOTE — H&P (Addendum)
Cardiology Office Note   Date: 05/08/2015   ID: Charles Montgomery, DOB [birth year], MRN [id number]  PCP: Georgann Housekeeper, MD Cardiologist: Dr. Mayford Knife  Chief Complaint: chest Pain   History of Present Illness: Charles Montgomery is a 49 y.o. male who presents for follow-up of heart failure. As a history of newly diagnosed dilated cardiomyopathy of unknown etiology, possibly due to hypertension and or EtOH and cocaine use. EF 20%. CK D precludes cath. He has had no chest pain. He also has hypertension, dyslipidemia, CK D with a creatinine of 4 and atypical chest pain.  She was seen by Dr. Mayford Knife in 04/12/15. Carotid Dopplers were ordered and showed 1-39% bilateral carotid artery stenosis. Rest Myoview showed moderately decreased EF of 30-44% nuclear stress 37% there was no ST segment deviation during stress there was a need intermediate study with a large severe partially reversible inferior lateral defect consistent with prior infarct and moderate peri-infarct ischemia EF 37% with global hypokinesis mild LVE.  Patient quite drinking and doing cocaine. He has back on his smoking but is still smoking about half pack a day. He complains of constant chest tightness which is worse with little exertion. It has not improved on the medications. I discussed this patient with Dr. Mayford Knife and Dr.Nahser both recommend admission with hydration, renal consult and possible cardiac catheterization.    Past Medical History  Diagnosis Date  . Diabetes mellitus   . Hypertension   . High cholesterol   . GERD (gastroesophageal reflux disease)   . Hypothyroid   . Obesity   . Diabetic neuropathy (HCC)   . Hyperlipidemia   . Diabetic retinopathy of left eye (HCC)   . Systolic heart failure (HCC)   . CKD (chronic kidney disease)   . Leg DVT (deep venous thromboembolism), chronic (HCC)   . Nephrotic syndrome   . Fatigue   . Snores   . Cigarette  nicotine dependence, uncomplicated   . 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 DCM (dilated cardiomyopathy) (HCC) 04/12/2015    No past surgical history on file.   Current Outpatient Prescriptions  Medication Sig Dispense Refill  . apixaban (ELIQUIS) 2.5 MG TABS tablet Take 2.5 mg by mouth 2 (two) times daily.    Marland Kitchen aspirin EC 81 MG tablet Take 81 mg by mouth daily.    . Blood Glucose Calibration (CLEVER CHOICE GLUCOSE CONTROL) HIGH LIQD by In Vitro route.    . carvedilol (COREG) 25 MG tablet Take 1 tablet (25 mg total) by mouth 2 (two) times daily with a meal. 60 tablet 11  . Dulaglutide (TRULICITY) 1.5 MG/0.5ML SOPN Inject 1.5 mg into the skin once a week.     . fenofibrate (TRICOR) 48 MG tablet Take 48 mg by mouth 3 (three) times daily. Reported on 05/08/2015    . furosemide (LASIX) 40 MG tablet Take 1 tablet (40 mg total) by mouth 2 (two) times daily. 60 tablet 0  . hydrALAZINE (APRESOLINE) 25 MG tablet Take 1 tablet (25 mg total) by mouth every 8 (eight) hours. 90 tablet 0  . insulin aspart (NOVOLOG) 100 UNIT/ML injection Inject 30 Units into the skin 3 (three) times daily before meals.    . insulin detemir (LEVEMIR) 100 UNIT/ML injection Inject 50 Units into the skin 2 (two) times daily.     . isosorbide mononitrate (IMDUR) 30 MG 24 hr tablet Take 1 tablet (30 mg total) by mouth daily. 30 tablet 0  . KLOR-CON M20 20 MEQ tablet Take 20  mEq by mouth daily.     Marland Kitchen lisinopril (PRINIVIL,ZESTRIL) 5 MG tablet Take 5 mg by mouth daily.     No current facility-administered medications for this visit.    Allergies: Review of patient's allergies indicates no known allergies.    Social History: The patient  reports that he has been smoking Cigarettes. He has a 7.5 pack-year smoking history. He does not have any smokeless tobacco history on file. He reports that he  drinks alcohol. He reports that he does not use illicit drugs.   Family History: The patient's family history is not on file.    ROS: Please see the history of present illness. Otherwise, review of systems are positive for none. All other systems are reviewed and negative.    PHYSICAL EXAM: VS: BP 110/80 mmHg  Pulse 90  Ht  (1.753 m)  Wt 330 lb 9.6 oz (149.959 kg)  BMI 48.80 kg/m2 , BMI Body mass index is 48.8 kg/(m^2). GEN: Obese, in no acute distress  Neck: no JVD, HJR, carotid bruits, or masses Cardiac: RRR; no murmurs,gallop, rubs, thrill or heave,  Respiratory: clear to auscultation bilaterally, normal work of breathing GI: soft, nontender, nondistended, + BS MS: no deformity or atrophy  Extremities: without cyanosis, clubbing, edema, good distal pulses bilaterally.  Skin: warm and dry, no rash Neuro: Strength and sensation are intact    EKG: EKG is ordered today. The ekg ordered today demonstrates normal sinus rhythm, no acute change Recent Labs: 12/30/2014: ALT 30; B Natriuretic Peptide 371.8* 01/08/2015: Hemoglobin 11.8*; Platelets 330 04/12/2015: BUN 56*; Creat 3.90*; Potassium 3.9; Sodium 140    Lipid Panel  Labs (Brief)    No results found for: CHOL, TRIG, HDL, CHOLHDL, VLDL, LDLCALC, LDLDIRECT     Wt Readings from Last 3 Encounters:  05/08/15 330 lb 9.6 oz (149.959 kg)  05/08/15 326 lb 11.2 oz (148.19 kg)  04/17/15 320 lb (145.151 kg)      Other studies Reviewed: Additional studies/ records that were reviewed today include and review of the records demonstrates:   Study Highlights     The left ventricular ejection fraction is moderately decreased (30-44%).  Nuclear stress EF: 37%.  There was no ST segment deviation noted during stress.  This is an intermediate risk study.  Intermediate risk stress nuclear study with a large, severe, partially reversible inferior lateral defect consistent with prior infarct and  moderate peri-infarct ischemia; EF 37 with global hypokinesis; mild LVE.     ASSESSMENT AND PLAN:  Chest pain Patient is having constant chest pain that's worse with exertion. He had a nuclear stress test that is felt to be intermediate risk with a large severe inferolateral defect consistent with prior infarct and moderate ischemia EF 37% with global hypokinesis. I discussed this patient with Dr. Mayford Knife and Dr.Nahser concur that he needs to be admitted to the hospital. He is at high risk for acute renal failure and dialysis if he undergoes cardiac catheterization but he also has a high risk stress Myoview and regular chest pain. He will be admitted for IV hydration, renal consult and possible cardiac catheterization. Patient understands and is agreeable.  CKD (chronic kidney disease) As creatinine 3.9. Followed by Dr. Lowell Guitar. To be seen in the hospital.  Hypertension Blood Pressure stable  Systolic heart failure (HCC) No Heart Failure and exam.  Cigarette nicotine dependence, uncomplicated Smoking cessation recommended  ETOH abuse Patient says he quit drinking alcohol    Signed, Jacolyn Reedy, PA-C  05/08/2015 1:04 PM  [facility] [address] [place], [place], [place] [address] Phone: [phone]; Fax: [phone]              Patient seen and examined with Herma Carson, PA.  . We discussed all aspects of the encounter. I agree with the assessment and plan as stated above.  Patient with at least intermediate risk nuclear stress test.  Has components of both typical and atypical CP.  He has CP daily that does not go away but does worsen with exertion.  Severe DCM felt initially secondary to Hypertensive DCM.  EF improved on nuclear but still in the 30% range.  He has CKD felt secondary to hypertensive kidney disease.  Seen in the office today complaining of CP and no admitted for further workup.  Will get Nephrology to see.  He is high risk for  contrast induced nephropathy.  Will start gently IV hydration cautiously with history of LV dysfunction.  He is on low dose ACE I - will discuss with renal Continue Coreg, Hydralazine and Imdur for LV dysfunction.  Hold diuretics.  He is high risk for contrast induced nephropathy with underlying CKD stage IV so will get renal input before considering cath.  Will hold Apixaban (on for chronic DVT) and place on IV Heparin per pharmacy for now.   Signed: Armanda Magic, MD [facility] 05/08/2015

## 2015-05-09 DIAGNOSIS — I82509 Chronic embolism and thrombosis of unspecified deep veins of unspecified lower extremity: Secondary | ICD-10-CM | POA: Diagnosis present

## 2015-05-09 DIAGNOSIS — R51 Headache: Secondary | ICD-10-CM | POA: Diagnosis present

## 2015-05-09 DIAGNOSIS — R079 Chest pain, unspecified: Secondary | ICD-10-CM | POA: Diagnosis present

## 2015-05-09 DIAGNOSIS — Z6841 Body Mass Index (BMI) 40.0 and over, adult: Secondary | ICD-10-CM | POA: Diagnosis not present

## 2015-05-09 DIAGNOSIS — Z79899 Other long term (current) drug therapy: Secondary | ICD-10-CM | POA: Diagnosis not present

## 2015-05-09 DIAGNOSIS — Z7982 Long term (current) use of aspirin: Secondary | ICD-10-CM | POA: Diagnosis not present

## 2015-05-09 DIAGNOSIS — I5022 Chronic systolic (congestive) heart failure: Secondary | ICD-10-CM | POA: Diagnosis present

## 2015-05-09 DIAGNOSIS — F1010 Alcohol abuse, uncomplicated: Secondary | ICD-10-CM | POA: Diagnosis present

## 2015-05-09 DIAGNOSIS — E785 Hyperlipidemia, unspecified: Secondary | ICD-10-CM | POA: Diagnosis present

## 2015-05-09 DIAGNOSIS — I130 Hypertensive heart and chronic kidney disease with heart failure and stage 1 through stage 4 chronic kidney disease, or unspecified chronic kidney disease: Secondary | ICD-10-CM | POA: Diagnosis present

## 2015-05-09 DIAGNOSIS — E1140 Type 2 diabetes mellitus with diabetic neuropathy, unspecified: Secondary | ICD-10-CM | POA: Diagnosis present

## 2015-05-09 DIAGNOSIS — Z86718 Personal history of other venous thrombosis and embolism: Secondary | ICD-10-CM | POA: Diagnosis not present

## 2015-05-09 DIAGNOSIS — I471 Supraventricular tachycardia: Secondary | ICD-10-CM | POA: Diagnosis present

## 2015-05-09 DIAGNOSIS — I420 Dilated cardiomyopathy: Secondary | ICD-10-CM | POA: Diagnosis present

## 2015-05-09 DIAGNOSIS — Z794 Long term (current) use of insulin: Secondary | ICD-10-CM | POA: Diagnosis not present

## 2015-05-09 DIAGNOSIS — N184 Chronic kidney disease, stage 4 (severe): Secondary | ICD-10-CM | POA: Diagnosis present

## 2015-05-09 DIAGNOSIS — I10 Essential (primary) hypertension: Secondary | ICD-10-CM | POA: Diagnosis not present

## 2015-05-09 DIAGNOSIS — I6523 Occlusion and stenosis of bilateral carotid arteries: Secondary | ICD-10-CM | POA: Diagnosis present

## 2015-05-09 DIAGNOSIS — N269 Renal sclerosis, unspecified: Secondary | ICD-10-CM | POA: Diagnosis present

## 2015-05-09 DIAGNOSIS — D649 Anemia, unspecified: Secondary | ICD-10-CM | POA: Diagnosis present

## 2015-05-09 DIAGNOSIS — K219 Gastro-esophageal reflux disease without esophagitis: Secondary | ICD-10-CM | POA: Diagnosis present

## 2015-05-09 DIAGNOSIS — F17210 Nicotine dependence, cigarettes, uncomplicated: Secondary | ICD-10-CM | POA: Diagnosis present

## 2015-05-09 DIAGNOSIS — R0789 Other chest pain: Secondary | ICD-10-CM | POA: Diagnosis present

## 2015-05-09 DIAGNOSIS — E039 Hypothyroidism, unspecified: Secondary | ICD-10-CM | POA: Diagnosis present

## 2015-05-09 DIAGNOSIS — E11319 Type 2 diabetes mellitus with unspecified diabetic retinopathy without macular edema: Secondary | ICD-10-CM | POA: Diagnosis present

## 2015-05-09 DIAGNOSIS — Z7901 Long term (current) use of anticoagulants: Secondary | ICD-10-CM | POA: Diagnosis not present

## 2015-05-09 LAB — CBC
HEMATOCRIT: 29.2 % — AB (ref 39.0–52.0)
HEMOGLOBIN: 9.7 g/dL — AB (ref 13.0–17.0)
MCH: 27.3 pg (ref 26.0–34.0)
MCHC: 33.2 g/dL (ref 30.0–36.0)
MCV: 82.3 fL (ref 78.0–100.0)
Platelets: 241 10*3/uL (ref 150–400)
RBC: 3.55 MIL/uL — AB (ref 4.22–5.81)
RDW: 13.4 % (ref 11.5–15.5)
WBC: 6.5 10*3/uL (ref 4.0–10.5)

## 2015-05-09 LAB — BASIC METABOLIC PANEL
ANION GAP: 6 (ref 5–15)
BUN: 39 mg/dL — AB (ref 6–20)
CALCIUM: 8.9 mg/dL (ref 8.9–10.3)
CO2: 27 mmol/L (ref 22–32)
Chloride: 108 mmol/L (ref 101–111)
Creatinine, Ser: 3.86 mg/dL — ABNORMAL HIGH (ref 0.61–1.24)
GFR calc Af Amer: 20 mL/min — ABNORMAL LOW (ref >60)
GFR, EST NON AFRICAN AMERICAN: 17 mL/min — AB (ref >60)
GLUCOSE: 110 mg/dL — AB (ref 65–99)
POTASSIUM: 3.5 mmol/L (ref 3.5–5.1)
SODIUM: 141 mmol/L (ref 135–145)

## 2015-05-09 LAB — MAGNESIUM: Magnesium: 2.2 mg/dL (ref 1.7–2.4)

## 2015-05-09 LAB — HEPARIN LEVEL (UNFRACTIONATED): Heparin Unfractionated: 0.35 IU/mL (ref 0.30–0.70)

## 2015-05-09 LAB — GLUCOSE, CAPILLARY
GLUCOSE-CAPILLARY: 161 mg/dL — AB (ref 65–99)
GLUCOSE-CAPILLARY: 84 mg/dL (ref 65–99)
GLUCOSE-CAPILLARY: 118 mg/dL — AB (ref 65–99)
Glucose-Capillary: 168 mg/dL — ABNORMAL HIGH (ref 65–99)

## 2015-05-09 LAB — TROPONIN I
Troponin I: <0.03 ng/mL (ref <0.031)
Troponin I: <0.03 ng/mL (ref <0.031)

## 2015-05-09 LAB — IRON AND TIBC
IRON: 53 ug/dL (ref 45–182)
SATURATION RATIOS: 20 % (ref 17.9–39.5)
TIBC: 262 ug/dL (ref 250–450)
UIBC: 209 ug/dL

## 2015-05-09 LAB — FERRITIN: Ferritin: 201 ng/mL (ref 24–336)

## 2015-05-09 LAB — HEMOGLOBIN A1C
HEMOGLOBIN A1C: 8.3 % — AB (ref 4.8–5.6)
Mean Plasma Glucose: 192 mg/dL

## 2015-05-09 LAB — APTT
APTT: 40 s — AB (ref 24–37)
APTT: 48 s — AB (ref 24–37)

## 2015-05-09 MED ORDER — SODIUM CHLORIDE 0.9 % IV SOLN: 250.0000 mL | INTRAVENOUS | Status: DC | PRN

## 2015-05-09 MED ORDER — SODIUM CHLORIDE 0.9 % IJ SOLN
3.0000 mL | Freq: Two times a day (BID) | INTRAMUSCULAR | Status: DC
Start: 2015-05-09 — End: 2015-05-10
  Administered 2015-05-09: 3 mL via INTRAVENOUS

## 2015-05-09 MED ORDER — SODIUM CHLORIDE 0.9 % IV SOLN
INTRAVENOUS | Status: DC
  Administered 2015-05-09: 13:00:00 via INTRAVENOUS

## 2015-05-09 MED ORDER — NICOTINE 14 MG/24HR TD PT24
14.0000 mg | MEDICATED_PATCH | Freq: Every day | TRANSDERMAL | Status: DC
  Administered 2015-05-09 – 2015-05-12 (×4): 14 mg via TRANSDERMAL
  Filled 2015-05-09 (×4): qty 1

## 2015-05-09 MED ORDER — SODIUM CHLORIDE 0.9 % IV SOLN: INTRAVENOUS | Status: DC

## 2015-05-09 MED ORDER — SODIUM CHLORIDE 0.9 % IJ SOLN: 3.0000 mL | INTRAMUSCULAR | Status: DC | PRN

## 2015-05-09 MED ORDER — ASPIRIN 81 MG PO CHEW
81.0000 mg | CHEWABLE_TABLET | ORAL | Status: AC
  Administered 2015-05-10: 81 mg via ORAL
  Filled 2015-05-09: qty 1

## 2015-05-09 NOTE — Progress Notes (Signed)
Please note that patient refuses the bed alarm and has refused the bed alarm during the shift. Explained the benefits of it and safety issues in regards to him having a previous fall in the past 41months. Patient understood and still refused.  Hourly rounding was performed during shift between the nurse and nurse tech. Will continue to monitor patient to end of shift and have reported to oncoming nurse of this issue.

## 2015-05-09 NOTE — Progress Notes (Signed)
ANTICOAGULATION CONSULT NOTE - Follow-up Consult  Pharmacy Consult for heparin while apixaban on hold for  cath Indication: L leg DVT  No Known Allergies  Patient Measurements: Height: 5\' 9"  (175.3 cm) Weight: (!) 323 lb 14.4 oz (146.92 kg) IBW/kg (Calculated) : 70.7 Heparin Dosing Weight: 105.8 kg  Vital Signs: Temp: 98.1 F (36.7 C) (12/20 2002) Temp Source: Oral (12/20 2002) BP: 113/80 mmHg (12/20 2002) Pulse Rate: 90 (12/20 2002)  Labs:  Recent Labs  05/08/15 1705 05/08/15 2256 05/09/15 0430 05/09/15 1035 05/09/15 1900  HGB 10.3*  10.3*  --  9.7*  --   --   HCT 31.6*  31.6*  --  29.2*  --   --   PLT 265  269  --  241  --   --   APTT 27  --  40*  --  48*  LABPROT 14.2  --   --   --   --   INR 1.08  --   --   --   --   HEPARINUNFRC 0.68  --  0.35  --   --   CREATININE 3.97*  3.92*  --   --  3.86*  --   TROPONINI <0.03 <0.03 <0.03  --   --     Estimated Creatinine Clearance: 33.5 mL/min (by C-G formula based on Cr of 3.86).   Assessment: 49 yo M on apixaban 2.5 mg po bid for hx L leg DVT.  Pharmacy consulted to dose heparin as bridge therapy while apixaban on hold for cardiac cath. Last apixaban 2.5 mg dose 12/19 a.m. Baseline heparin level 0.68 indicating that apixaban still affecting. Baseline aPTT 27 sec.   Utilizing aPTT to monitor heparin for now as apixaban still affecting heparin level.  APTT still low at 48 seconds after rate increased to 1900 units/hr. No issues with line or bleeding reported per RN.  Goal of Therapy:  aPTT 66-102 seconds; heparin level 0.3-0.7 units/ml Monitor platelets by anticoagulation protocol: Yes   Plan:  - Increase heparin to 2100 units/hr - F/u am aPTT/HL - for cath 12/21 am - Daily aPTT and heparin level - should be correlating by 12/21  Herby Abraham, Pharm.D. [phone] 05/09/2015 8:28 PM

## 2015-05-09 NOTE — Progress Notes (Signed)
Patient had run of what appears to have been a run of wide QRS/and or SVTs. Nurse was made aware of this by the CMT. Patient was asymptomatic and at that time, phlebotomy was drawing labs. Notified Dr. Okey Dupre. Will continue to monitor patient to end of shift.

## 2015-05-09 NOTE — Progress Notes (Signed)
Patient Name: Charles Montgomery Date of Encounter: 05/09/2015   SUBJECTIVE  Still have mild chest discomfort. No sob or palpitations. Seen by nephrology and recommended minimal contrast. Patient willing to proceed.   CURRENT MEDS . aspirin  324 mg Oral NOW   Or  . aspirin  300 mg Rectal NOW  . aspirin EC  81 mg Oral Daily  . carvedilol  25 mg Oral BID WC  . Dulaglutide  1.5 mg Subcutaneous Weekly  . fenofibrate  54 mg Oral Daily  . hydrALAZINE  25 mg Oral 3 times per day  . insulin aspart  0-15 Units Subcutaneous TID WC  . insulin aspart  0-5 Units Subcutaneous QHS  . insulin aspart  30 Units Subcutaneous TID AC  . insulin detemir  50 Units Subcutaneous BID  . isosorbide mononitrate  30 mg Oral Daily  . nicotine  14 mg Transdermal Daily    OBJECTIVE  Filed Vitals:   05/08/15 1427 05/08/15 2100 05/09/15 0505 05/09/15 0857  BP: 109/68 117/66 127/60 130/54  Pulse: 92 87 92 90  Temp: 98 F (36.7 C) 97.9 F (36.6 C) 98.6 F (37 C)   TempSrc: Oral Oral Oral   Resp: 18 18 18    Height:      Weight:   323 lb 14.4 oz (146.92 kg)   SpO2: 99% 100% 100%     Intake/Output Summary (Last 24 hours) at 05/09/15 0925 Last data filed at 05/09/15 4158  Gross per 24 hour  Intake 1561.87 ml  Output   1400 ml  Net 161.87 ml   Filed Weights   05/08/15 1424 05/09/15 0505  Weight: 322 lb 11.2 oz (146.376 kg) 323 lb 14.4 oz (146.92 kg)    PHYSICAL EXAM  General: Pleasant, obese male NAD who sleeping in bed Neuro: Alert and oriented X 3. Moves all extremities spontaneously. Psych: Normal affect. HEENT:  Normal  Neck: Supple without bruits or JVD. Lungs:  Resp regular and unlabored, CTA. Heart: RRR no s3, s4, or murmurs. Abdomen: Soft, non-tender, non-distended, BS + x 4.  Extremities: No clubbing, cyanosis or edema. DP/PT/Radials 2+ and equal bilaterally.  Accessory Clinical Findings  CBC  Recent Labs  05/08/15 1705 05/09/15 0430  WBC 6.9  7.0 6.5  NEUTROABS 3.6   3.6  --   HGB 10.3*  10.3* 9.7*  HCT 31.6*  31.6* 29.2*  MCV 81.9  82.3 82.3  PLT 265  269 241   Basic Metabolic Panel  Recent Labs  05/08/15 1705  NA 140  140  K 4.1  4.1  CL 103  103  CO2 28  28  GLUCOSE 135*  136*  BUN 42*  41*  CREATININE 3.97*  3.92*  CALCIUM 9.2  9.1  MG 2.1   Liver Function Tests  Recent Labs  05/08/15 1705  AST 21  19  ALT 12*  14*  ALKPHOS 81  80  BILITOT 0.4  0.5  PROT 6.6  6.8  ALBUMIN 3.0*  3.0*   Cardiac Enzymes  Recent Labs  05/08/15 1705 05/08/15 2256 05/09/15 0430  TROPONINI <0.03 <0.03 <0.03   Hemoglobin A1C  Recent Labs  05/08/15 1847  HGBA1C 8.3*    TELE  Sinus rhythm  Radiology/Studies   No results found.  ASSESSMENT AND PLAN  1. Chest pain Patient is having constant chest pain that's worse with exertion. He had a nuclear stress test that is felt to be intermediate risk with a large severe inferolateral defect consistent with prior  infarct and moderate ischemia EF 37% with global hypokinesis - The patient willing to proceed with cath. Nephrology recommended minimal contrast with IV hydration. The patient had breakfast earlier this morning at 7:30am. Cath later afternoon today vs tomorrow. MD to decide.  - DC lisinopril. Continue heparin. Last dose of Eliqis 2.5 mg 12/19 am. Top x 3 negative.  2. CKD (chronic kidney disease) - As creatinine 3.92.as above. Will bet BMET today.   3. Hypertension Blood Pressure stable  4. Chronic systolic heart failure (HCC) - No Heart Failure and exam.  - Last echo 12/2014 showed LV ef of 20-25%.   5. Cigarette nicotine dependence, uncomplicated - Smoking cessation recommended. Education given  6. ETOH abuse - Patient says he quit drinking alcohol  7. DM - HgbA1c of 8.3. Continue SSI.   8. Sinus arrhythmia - non sustained 12 beats of sinus arrhythmia this morning 0440. Looks like SVT or VT. Asymptomatic. Check lytes.   Lorelei Pont  PA-C Pager [phone]

## 2015-05-09 NOTE — Progress Notes (Signed)
ANTICOAGULATION CONSULT NOTE - Follow-up Consult  Pharmacy Consult for heparin while apixaban on hold for possible cath Indication: L leg DVT  No Known Allergies  Patient Measurements: Height: 5\' 9"  (175.3 cm) Weight: (!) 323 lb 14.4 oz (146.92 kg) IBW/kg (Calculated) : 70.7 Heparin Dosing Weight: 105.8 kg  Vital Signs: Temp: 98.6 F (37 C) (12/20 0505) Temp Source: Oral (12/20 0505) BP: 127/60 mmHg (12/20 0505) Pulse Rate: 92 (12/20 0505)  Labs:  Recent Labs  05/08/15 1705 05/08/15 2256 05/09/15 0430  HGB 10.3*  10.3*  --  9.7*  HCT 31.6*  31.6*  --  29.2*  PLT 265  269  --  241  APTT 27  --  40*  LABPROT 14.2  --   --   INR 1.08  --   --   HEPARINUNFRC 0.68  --  0.35  CREATININE 3.97*  3.92*  --   --   TROPONINI <0.03 <0.03 <0.03    Estimated Creatinine Clearance: 33 mL/min (by C-G formula based on Cr of 3.92).   Assessment: 49 yo M on apixaban 2.5 mg po bid for hx L leg DVT.  Pharmacy consulted to dose heparin as bridge therapy while apixaban on hold for possible cardiac cath. Last apixaban 2.5 mg dose 12/19 a.m. Baseline heparin level 0.68 indicating that apixaban still affecting. Baseline aPTT 27 sec.   Utilizing aPTT to monitor heparin for now as apixaban still affecting heparin level. aPTT subtherapeutic (40 sec) on 1600 units/hr. No issues with line or bleeding reported per RN.  Goal of Therapy:  aPTT 66-102 seconds; heparin level 0.3-0.7 units/ml Monitor platelets by anticoagulation protocol: Yes   Plan:  - Increase heparin to 1900 units/hr - F/u 8 hr aPTT - Daily aPTT and heparin level - should be correlating by 12/21  Christoper Fabian, PharmD, BCPS Clinical pharmacist, pager [phone] 05/09/2015 6:31 AM

## 2015-05-09 NOTE — Progress Notes (Signed)
S: no new CO O:BP 130/54 mmHg  Pulse 90  Temp(Src) 98.6 F (37 C) (Oral)  Resp 18  Ht 5\' 9"  (1.753 m)  Wt 146.92 kg (323 lb 14.4 oz)  BMI 47.81 kg/m2  SpO2 100%  Intake/Output Summary (Last 24 hours) at 05/09/15 0929 Last data filed at 05/09/15 5284  Gross per 24 hour  Intake 1561.87 ml  Output   1400 ml  Net 161.87 ml   Weight change:  XLK:GMWNU and alert CVS:RRR Resp:clear Abd:+ BS NTND Ext:no edema NEURO: CNI Ox3 no asterixis   . aspirin  324 mg Oral NOW   Or  . aspirin  300 mg Rectal NOW  . aspirin EC  81 mg Oral Daily  . carvedilol  25 mg Oral BID WC  . Dulaglutide  1.5 mg Subcutaneous Weekly  . fenofibrate  54 mg Oral Daily  . hydrALAZINE  25 mg Oral 3 times per day  . insulin aspart  0-15 Units Subcutaneous TID WC  . insulin aspart  0-5 Units Subcutaneous QHS  . insulin aspart  30 Units Subcutaneous TID AC  . insulin detemir  50 Units Subcutaneous BID  . isosorbide mononitrate  30 mg Oral Daily  . nicotine  14 mg Transdermal Daily   No results found. BMET    Component Value Date/Time   NA 140 05/08/2015 1705   NA 140 05/08/2015 1705   K 4.1 05/08/2015 1705   K 4.1 05/08/2015 1705   CL 103 05/08/2015 1705   CL 103 05/08/2015 1705   CO2 28 05/08/2015 1705   CO2 28 05/08/2015 1705   GLUCOSE 136* 05/08/2015 1705   GLUCOSE 135* 05/08/2015 1705   BUN 41* 05/08/2015 1705   BUN 42* 05/08/2015 1705   CREATININE 3.92* 05/08/2015 1705   CREATININE 3.97* 05/08/2015 1705   CREATININE 3.90* 04/12/2015 1515   CALCIUM 9.1 05/08/2015 1705   CALCIUM 9.2 05/08/2015 1705   GFRNONAA 17* 05/08/2015 1705   GFRNONAA 16* 05/08/2015 1705   GFRAA 19* 05/08/2015 1705   GFRAA 19* 05/08/2015 1705   CBC    Component Value Date/Time   WBC 6.5 05/09/2015 0430   RBC 3.55* 05/09/2015 0430   HGB 9.7* 05/09/2015 0430   HCT 29.2* 05/09/2015 0430   PLT 241 05/09/2015 0430   MCV 82.3 05/09/2015 0430   MCH 27.3 05/09/2015 0430   MCHC 33.2 05/09/2015 0430   RDW 13.4  05/09/2015 0430   LYMPHSABS 2.6 05/08/2015 1705   LYMPHSABS 2.5 05/08/2015 1705   MONOABS 0.6 05/08/2015 1705   MONOABS 0.6 05/08/2015 1705   EOSABS 0.1 05/08/2015 1705   EOSABS 0.2 05/08/2015 1705   BASOSABS 0.0 05/08/2015 1705   BASOSABS 0.0 05/08/2015 1705     Assessment:  1. CKD 4 sec FSGS 2. Cardiomyopathy and atypical CP 3. Hx DVT 4. Sec HPTH last PTH 01/31/15 5. Anemia has not required ESA, check iron studies  Plan: 1. For heart cath today 2. Scr q d x 2 3. Check iron studies   Linna Thebeau T

## 2015-05-10 ENCOUNTER — Encounter (HOSPITAL_COMMUNITY): Payer: Self-pay | Admitting: Interventional Cardiology

## 2015-05-10 ENCOUNTER — Encounter (HOSPITAL_COMMUNITY)
Admission: AD | Disposition: A | Discharge status: Home / Self Care | Payer: Self-pay | Source: Ambulatory Visit | Attending: Cardiology

## 2015-05-10 HISTORY — PX: CARDIAC CATHETERIZATION: SHX172

## 2015-05-10 LAB — BASIC METABOLIC PANEL
ANION GAP: 8 (ref 5–15)
BUN: 37 mg/dL — ABNORMAL HIGH (ref 6–20)
CALCIUM: 9 mg/dL (ref 8.9–10.3)
CO2: 26 mmol/L (ref 22–32)
Chloride: 105 mmol/L (ref 101–111)
Creatinine, Ser: 3.68 mg/dL — ABNORMAL HIGH (ref 0.61–1.24)
GFR calc Af Amer: 21 mL/min — ABNORMAL LOW (ref >60)
GFR calc non Af Amer: 18 mL/min — ABNORMAL LOW (ref >60)
GLUCOSE: 211 mg/dL — AB (ref 65–99)
Potassium: 3.5 mmol/L (ref 3.5–5.1)
Sodium: 139 mmol/L (ref 135–145)

## 2015-05-10 LAB — CBC
HEMATOCRIT: 28.3 % — AB (ref 39.0–52.0)
Hemoglobin: 9.5 g/dL — ABNORMAL LOW (ref 13.0–17.0)
MCH: 27.4 pg (ref 26.0–34.0)
MCHC: 33.6 g/dL (ref 30.0–36.0)
MCV: 81.6 fL (ref 78.0–100.0)
Platelets: 232 10*3/uL (ref 150–400)
RBC: 3.47 MIL/uL — ABNORMAL LOW (ref 4.22–5.81)
RDW: 13.3 % (ref 11.5–15.5)
WBC: 5.5 10*3/uL (ref 4.0–10.5)

## 2015-05-10 LAB — GLUCOSE, CAPILLARY
GLUCOSE-CAPILLARY: 126 mg/dL — AB (ref 65–99)
Glucose-Capillary: 144 mg/dL — ABNORMAL HIGH (ref 65–99)
Glucose-Capillary: 214 mg/dL — ABNORMAL HIGH (ref 65–99)
Glucose-Capillary: 131 mg/dL — ABNORMAL HIGH (ref 65–99)

## 2015-05-10 LAB — APTT: aPTT: 62 seconds — ABNORMAL HIGH (ref 24–37)

## 2015-05-10 LAB — HEPARIN LEVEL (UNFRACTIONATED): Heparin Unfractionated: 0.33 IU/mL (ref 0.30–0.70)

## 2015-05-10 SURGERY — LEFT HEART CATH AND CORONARY ANGIOGRAPHY

## 2015-05-10 MED ORDER — DULAGLUTIDE 1.5 MG/0.5ML ~~LOC~~ SOAJ
1.5000 mg | SUBCUTANEOUS | Status: DC
  Administered 2015-05-10: 1.5 mg via SUBCUTANEOUS

## 2015-05-10 MED ORDER — LIDOCAINE HCL (PF) 1 % IJ SOLN
INTRAMUSCULAR | Status: AC
  Filled 2015-05-10: qty 30

## 2015-05-10 MED ORDER — MIDAZOLAM HCL 2 MG/2ML IJ SOLN
INTRAMUSCULAR | Status: DC | PRN
  Administered 2015-05-10: 1 mg via INTRAVENOUS

## 2015-05-10 MED ORDER — FENTANYL CITRATE (PF) 100 MCG/2ML IJ SOLN
INTRAMUSCULAR | Status: DC | PRN
  Administered 2015-05-10: 50 ug via INTRAVENOUS

## 2015-05-10 MED ORDER — IOHEXOL 350 MG/ML SOLN
INTRAVENOUS | Status: DC | PRN
  Administered 2015-05-10: 30 mL via INTRA_ARTERIAL

## 2015-05-10 MED ORDER — HYDRALAZINE HCL 50 MG PO TABS
50.0000 mg | ORAL_TABLET | Freq: Three times a day (TID) | ORAL | Status: DC
Start: 2015-05-10 — End: 2015-05-12
  Administered 2015-05-10 – 2015-05-12 (×6): 50 mg via ORAL
  Filled 2015-05-10 (×6): qty 1

## 2015-05-10 MED ORDER — SODIUM CHLORIDE 0.9 % IJ SOLN
3.0000 mL | Freq: Two times a day (BID) | INTRAMUSCULAR | Status: DC
  Administered 2015-05-10 – 2015-05-12 (×5): 3 mL via INTRAVENOUS

## 2015-05-10 MED ORDER — HEPARIN SODIUM (PORCINE) 1000 UNIT/ML IJ SOLN
INTRAMUSCULAR | Status: AC
  Filled 2015-05-10: qty 1

## 2015-05-10 MED ORDER — ACETAMINOPHEN 325 MG PO TABS
650.0000 mg | ORAL_TABLET | ORAL | Status: DC | PRN
  Administered 2015-05-11 – 2015-05-12 (×3): 650 mg via ORAL
  Filled 2015-05-10 (×4): qty 2

## 2015-05-10 MED ORDER — HEPARIN SODIUM (PORCINE) 1000 UNIT/ML IJ SOLN
INTRAMUSCULAR | Status: DC | PRN
  Administered 2015-05-10: 6000 [IU] via INTRAVENOUS

## 2015-05-10 MED ORDER — HEPARIN (PORCINE) IN NACL 2-0.9 UNIT/ML-% IJ SOLN
INTRAMUSCULAR | Status: AC
  Filled 2015-05-10: qty 1500

## 2015-05-10 MED ORDER — SODIUM CHLORIDE 0.9 % IV SOLN: 250.0000 mL | INTRAVENOUS | Status: DC | PRN

## 2015-05-10 MED ORDER — VERAPAMIL HCL 2.5 MG/ML IV SOLN
INTRAVENOUS | Status: DC | PRN
  Administered 2015-05-10: 10 mL via INTRA_ARTERIAL

## 2015-05-10 MED ORDER — MIDAZOLAM HCL 2 MG/2ML IJ SOLN
INTRAMUSCULAR | Status: AC
  Filled 2015-05-10: qty 2

## 2015-05-10 MED ORDER — LIDOCAINE HCL (PF) 1 % IJ SOLN
INTRAMUSCULAR | Status: DC | PRN
  Administered 2015-05-10: 5 mL

## 2015-05-10 MED ORDER — APIXABAN 2.5 MG PO TABS
2.5000 mg | ORAL_TABLET | Freq: Two times a day (BID) | ORAL | Status: DC
  Administered 2015-05-10 – 2015-05-12 (×4): 2.5 mg via ORAL
  Filled 2015-05-10 (×5): qty 1

## 2015-05-10 MED ORDER — VERAPAMIL HCL 2.5 MG/ML IV SOLN
INTRAVENOUS | Status: AC
  Filled 2015-05-10: qty 2

## 2015-05-10 MED ORDER — FENTANYL CITRATE (PF) 100 MCG/2ML IJ SOLN
INTRAMUSCULAR | Status: AC
  Filled 2015-05-10: qty 2

## 2015-05-10 MED ORDER — SODIUM CHLORIDE 0.9 % IJ SOLN: 3.0000 mL | INTRAMUSCULAR | Status: DC | PRN

## 2015-05-10 MED ORDER — SODIUM CHLORIDE 0.9 % WEIGHT BASED INFUSION: 1.0000 mL/kg/h | INTRAVENOUS | Status: AC

## 2015-05-10 SURGICAL SUPPLY — 10 items
CATH INFINITI 5 FR JL3.5 (CATHETERS) ×3 IMPLANT
CATH INFINITI JR4 5F (CATHETERS) ×3 IMPLANT
DEVICE RAD COMP TR BAND LRG (VASCULAR PRODUCTS) ×3 IMPLANT
GLIDESHEATH SLEND A-KIT 6F 22G (SHEATH) ×3 IMPLANT
HOVERMATT SINGLE USE (MISCELLANEOUS) ×3 IMPLANT
KIT HEART LEFT (KITS) ×3 IMPLANT
PACK CARDIAC CATHETERIZATION (CUSTOM PROCEDURE TRAY) ×3 IMPLANT
TRANSDUCER W/STOPCOCK (MISCELLANEOUS) ×3 IMPLANT
TUBING CIL FLEX 10 FLL-RA (TUBING) ×3 IMPLANT
WIRE SAFE-T 1.5MM-J .035X260CM (WIRE) ×3 IMPLANT

## 2015-05-10 NOTE — Progress Notes (Addendum)
PHARMACIST - PHYSICIAN COMMUNICATION  CONCERNING:  Eliquis   RECOMMENDATION:   He has completed his treatment dose of Eliquis for DVT history and is now only on prophylactic dosing at 2.5 mg po BID  Instead of resuming heparin at treatment dose, consider resuming Eliquis or prophylactic heparin    Thank you Okey Regal, PharmD [phone]

## 2015-05-10 NOTE — Plan of Care (Signed)
Problem: Consults Goal: Tobacco Cessation referral if indicated Outcome: Progressing Patient states he would like to try to stop smoking but knows once he leaves the hospital it is going to be very difficult to do so.

## 2015-05-10 NOTE — Progress Notes (Signed)
S: Had heart cath today and says no blockages O:BP 148/91 mmHg  Pulse 0  Temp(Src) 98.2 F (36.8 C) (Oral)  Resp 0  Ht 5\' 9"  (1.753 m)  Wt 147.374 kg (324 lb 14.4 oz)  BMI 47.96 kg/m2  SpO2 0%  Intake/Output Summary (Last 24 hours) at 05/10/15 0944 Last data filed at 05/10/15 0600  Gross per 24 hour  Intake 2094.8 ml  Output   2006 ml  Net   88.8 ml   Weight change:  UMP:NTIRW and alert CVS:RRR Resp:clear Abd:+ BS NTND Ext:no edema NEURO: CNI Ox3 no asterixis   . aspirin EC  81 mg Oral Daily  . carvedilol  25 mg Oral BID WC  . Dulaglutide  1.5 mg Subcutaneous Weekly  . fenofibrate  54 mg Oral Daily  . hydrALAZINE  25 mg Oral 3 times per day  . insulin aspart  0-15 Units Subcutaneous TID WC  . insulin aspart  0-5 Units Subcutaneous QHS  . insulin aspart  30 Units Subcutaneous TID AC  . insulin detemir  50 Units Subcutaneous BID  . isosorbide mononitrate  30 mg Oral Daily  . nicotine  14 mg Transdermal Daily  . sodium chloride  3 mL Intravenous Q12H   No results found. BMET    Component Value Date/Time   NA 139 05/10/2015 0420   K 3.5 05/10/2015 0420   CL 105 05/10/2015 0420   CO2 26 05/10/2015 0420   GLUCOSE 211* 05/10/2015 0420   BUN 37* 05/10/2015 0420   CREATININE 3.68* 05/10/2015 0420   CREATININE 3.90* 04/12/2015 1515   CALCIUM 9.0 05/10/2015 0420   GFRNONAA 18* 05/10/2015 0420   GFRAA 21* 05/10/2015 0420   CBC    Component Value Date/Time   WBC 5.5 05/10/2015 0420   RBC 3.47* 05/10/2015 0420   HGB 9.5* 05/10/2015 0420   HCT 28.3* 05/10/2015 0420   PLT 232 05/10/2015 0420   MCV 81.6 05/10/2015 0420   MCH 27.4 05/10/2015 0420   MCHC 33.6 05/10/2015 0420   RDW 13.3 05/10/2015 0420   LYMPHSABS 2.6 05/08/2015 1705   LYMPHSABS 2.5 05/08/2015 1705   MONOABS 0.6 05/08/2015 1705   MONOABS 0.6 05/08/2015 1705   EOSABS 0.1 05/08/2015 1705   EOSABS 0.2 05/08/2015 1705   BASOSABS 0.0 05/08/2015 1705   BASOSABS 0.0 05/08/2015 1705      Assessment:  1. CKD 4 sec FSGS 2. Cardiomyopathy and atypical CP 3. Hx DVT 4. Sec HPTH last PTH 01/31/15 5. Anemia has not required ESA, check iron studies  Plan: 1. Daily Scr 2. Resume lasix in AM Nathanie Ottley T

## 2015-05-10 NOTE — Progress Notes (Signed)
Patient is high fall risk due to sedation from procedure today. Pt refuses bed alarm and yellow armband. Will continue to monitor.

## 2015-05-10 NOTE — Progress Notes (Signed)
ANTICOAGULATION CONSULT NOTE - Follow Up Consult  Pharmacy Consult for heparin Indication: DVT  Labs:  Recent Labs  05/08/15 1705 05/08/15 2256 05/09/15 0430 05/09/15 1035 05/09/15 1900 05/10/15 0420  HGB 10.3*  10.3*  --  9.7*  --   --  9.5*  HCT 31.6*  31.6*  --  29.2*  --   --  28.3*  PLT 265  269  --  241  --   --  232  APTT 27  --  40*  --  48* 62*  LABPROT 14.2  --   --   --   --   --   INR 1.08  --   --   --   --   --   HEPARINUNFRC 0.68  --  0.35  --   --  0.33  CREATININE 3.97*  3.92*  --   --  3.86*  --   --   TROPONINI <0.03 <0.03 <0.03  --   --   --      Assessment: 49yo male remains subtherapeutic on heparin after rate increase; heparin level still being slightly affected by Eliquis.  Goal of Therapy:  aPTT 66-102 seconds   Plan:  Will increase heparin gtt by 10% to 2300 units/hr and check PTT in 8hr.  Vernard Gambles, PharmD, BCPS  05/10/2015,5:55 AM

## 2015-05-10 NOTE — Progress Notes (Signed)
SUBJECTIVE:  No complaints  OBJECTIVE:   Vitals:   Filed Vitals:   05/10/15 0908 05/10/15 0913 05/10/15 0918 05/10/15 1039  BP: 137/82 148/91  148/73  Pulse: 84 82 0 80  Temp:      TempSrc:      Resp: 16 23 0   Height:      Weight:      SpO2: 100% 0% 0%    I&O's:   Intake/Output Summary (Last 24 hours) at 05/10/15 1151 Last data filed at 05/10/15 0600  Gross per 24 hour  Intake 2094.8 ml  Output   1506 ml  Net  588.8 ml   TELEMETRY: Reviewed telemetry pt in NSR:     PHYSICAL EXAM General: Well developed, well nourished, in no acute distress Head: Eyes PERRLA, No xanthomas.   Normal cephalic and atramatic  Lungs:   Clear bilaterally to auscultation and percussion. Heart:   HRRR S1 S2 Pulses are 2+ & equal. Abdomen: Bowel sounds are positive, abdomen soft and non-tender without masses Extremities:   No clubbing, cyanosis or edema.  DP +1 Neuro: Alert and oriented X 3. Psych:  Good affect, responds appropriately   LABS: Basic Metabolic Panel:  Recent Labs  83/33/83 1705 05/09/15 1035 05/10/15 0420  NA 140  140 141 139  K 4.1  4.1 3.5 3.5  CL 103  103 108 105  CO2 28  28 27 26   GLUCOSE 135*  136* 110* 211*  BUN 42*  41* 39* 37*  CREATININE 3.97*  3.92* 3.86* 3.68*  CALCIUM 9.2  9.1 8.9 9.0  MG 2.1 2.2  --    Liver Function Tests:  Recent Labs  05/08/15 1705  AST 21  19  ALT 12*  14*  ALKPHOS 81  80  BILITOT 0.4  0.5  PROT 6.6  6.8  ALBUMIN 3.0*  3.0*   No results for input(s): LIPASE, AMYLASE in the last 72 hours. CBC:  Recent Labs  05/08/15 1705 05/09/15 0430 05/10/15 0420  WBC 6.9  7.0 6.5 5.5  NEUTROABS 3.6  3.6  --   --   HGB 10.3*  10.3* 9.7* 9.5*  HCT 31.6*  31.6* 29.2* 28.3*  MCV 81.9  82.3 82.3 81.6  PLT 265  269 241 232   Cardiac Enzymes:  Recent Labs  05/08/15 1705 05/08/15 2256 05/09/15 0430  TROPONINI <0.03 <0.03 <0.03   BNP: Invalid input(s): POCBNP D-Dimer: No results for input(s): DDIMER  in the last 72 hours. Hemoglobin A1C:  Recent Labs  05/08/15 1847  HGBA1C 8.3*   Fasting Lipid Panel: No results for input(s): CHOL, HDL, LDLCALC, TRIG, CHOLHDL, LDLDIRECT in the last 72 hours. Thyroid Function Tests: No results for input(s): TSH, T4TOTAL, T3FREE, THYROIDAB in the last 72 hours.  Invalid input(s): FREET3 Anemia Panel:  Recent Labs  05/09/15 1035  FERRITIN 201  TIBC 262  IRON 53   Coag Panel:   Lab Results  Component Value Date   INR 1.08 05/08/2015   INR 2.24* 01/08/2015   INR 1.54* 01/07/2015    RADIOLOGY: No results found.  ASSESSMENT AND PLAN  1. Noncardiac Chest pain - negative cardiac enzymes.  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  D/C ASA since starting Eliquis.  2. CKD (chronic kidney disease) - BMET pre cath today 3.68.  Continue IVF and monitor creatinine.  Will need to stay until Friday per renal.  3. Hypertension Blood Pressure borderline controlled.  Continue  coreg.  Increase Hydralazine to  TID.    4. 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   - No Heart Failure and exam.  - Last echo 12/2014 showed LV ef of 20-25%.  - continue Hydralazine/Imdur and BB.  No ACE I or ARB due to CKD.   5. Cigarette nicotine dependence, uncomplicated - Smoking cessation recommended. Education given  6. ETOH abuse - Patient says he quit drinking alcohol  7. DM - HgbA1c of 8.3. Continue SSI.   8.  Chronic DVT - restart Eliquis 2.5mg  BID tonight.      Quintella Reichert, MD  05/10/2015  11:51 AM

## 2015-05-11 LAB — CBC
HCT: 28.7 % — ABNORMAL LOW (ref 39.0–52.0)
HEMOGLOBIN: 9.4 g/dL — AB (ref 13.0–17.0)
MCH: 27 pg (ref 26.0–34.0)
MCHC: 32.8 g/dL (ref 30.0–36.0)
MCV: 82.5 fL (ref 78.0–100.0)
PLATELETS: 223 10*3/uL (ref 150–400)
RBC: 3.48 MIL/uL — AB (ref 4.22–5.81)
RDW: 13.2 % (ref 11.5–15.5)
WBC: 5.6 10*3/uL (ref 4.0–10.5)

## 2015-05-11 LAB — GLUCOSE, CAPILLARY
Glucose-Capillary: 117 mg/dL — ABNORMAL HIGH (ref 65–99)
Glucose-Capillary: 78 mg/dL (ref 65–99)
Glucose-Capillary: 125 mg/dL — ABNORMAL HIGH (ref 65–99)
Glucose-Capillary: 64 mg/dL — ABNORMAL LOW (ref 65–99)

## 2015-05-11 LAB — BASIC METABOLIC PANEL
ANION GAP: 8 (ref 5–15)
BUN: 29 mg/dL — ABNORMAL HIGH (ref 6–20)
CHLORIDE: 106 mmol/L (ref 101–111)
CO2: 24 mmol/L (ref 22–32)
Calcium: 8.7 mg/dL — ABNORMAL LOW (ref 8.9–10.3)
Creatinine, Ser: 3.28 mg/dL — ABNORMAL HIGH (ref 0.61–1.24)
GFR calc Af Amer: 24 mL/min — ABNORMAL LOW (ref >60)
GFR, EST NON AFRICAN AMERICAN: 21 mL/min — AB (ref >60)
GLUCOSE: 118 mg/dL — AB (ref 65–99)
POTASSIUM: 3.7 mmol/L (ref 3.5–5.1)
Sodium: 138 mmol/L (ref 135–145)

## 2015-05-11 MED ORDER — HYDROCODONE-ACETAMINOPHEN 5-325 MG PO TABS
1.0000 | ORAL_TABLET | Freq: Once | ORAL | Status: AC
  Administered 2015-05-11: 1 via ORAL
  Filled 2015-05-11: qty 1

## 2015-05-11 MED ORDER — FERUMOXYTOL INJECTION 510 MG/17 ML
510.0000 mg | Freq: Once | INTRAVENOUS | Status: AC
  Administered 2015-05-11: 510 mg via INTRAVENOUS
  Filled 2015-05-11: qty 17

## 2015-05-11 MED ORDER — FUROSEMIDE 40 MG PO TABS
40.0000 mg | ORAL_TABLET | Freq: Two times a day (BID) | ORAL | Status: DC
  Administered 2015-05-11: 40 mg via ORAL
  Filled 2015-05-11: qty 1

## 2015-05-11 MED ORDER — FUROSEMIDE 40 MG PO TABS
40.0000 mg | ORAL_TABLET | Freq: Two times a day (BID) | ORAL | Status: DC
  Administered 2015-05-11 – 2015-05-12 (×2): 40 mg via ORAL
  Filled 2015-05-11 (×2): qty 1

## 2015-05-11 NOTE — Progress Notes (Signed)
S: CO "floaters in his eyes which is not new. O:BP 119/70 mmHg  Pulse 89  Temp(Src) 98.2 F (36.8 C) (Oral)  Resp 1  Ht 5\' 9"  (1.753 m)  Wt 149.687 kg (330 lb)  BMI 48.71 kg/m2  SpO2 100%  Intake/Output Summary (Last 24 hours) at 05/11/15 0833 Last data filed at 05/11/15 0237  Gross per 24 hour  Intake    480 ml  Output    900 ml  Net   -420 ml   Weight change:  TTS:VXBLT and alert CVS:RRR Resp:clear Abd:+ BS NTND Ext:no edema NEURO: CNI Ox3 no asterixis   . apixaban  2.5 mg Oral BID  . carvedilol  25 mg Oral BID WC  . Dulaglutide  1.5 mg Subcutaneous Q Wed-1800  . fenofibrate  54 mg Oral Daily  . hydrALAZINE  50 mg Oral 3 times per day  . insulin aspart  0-15 Units Subcutaneous TID WC  . insulin aspart  0-5 Units Subcutaneous QHS  . insulin aspart  30 Units Subcutaneous TID AC  . insulin detemir  50 Units Subcutaneous BID  . isosorbide mononitrate  30 mg Oral Daily  . nicotine  14 mg Transdermal Daily  . sodium chloride  3 mL Intravenous Q12H   No results found. BMET    Component Value Date/Time   NA 138 05/11/2015 0435   K 3.7 05/11/2015 0435   CL 106 05/11/2015 0435   CO2 24 05/11/2015 0435   GLUCOSE 118* 05/11/2015 0435   BUN 29* 05/11/2015 0435   CREATININE 3.28* 05/11/2015 0435   CREATININE 3.90* 04/12/2015 1515   CALCIUM 8.7* 05/11/2015 0435   GFRNONAA 21* 05/11/2015 0435   GFRAA 24* 05/11/2015 0435   CBC    Component Value Date/Time   WBC 5.6 05/11/2015 0435   RBC 3.48* 05/11/2015 0435   HGB 9.4* 05/11/2015 0435   HCT 28.7* 05/11/2015 0435   PLT 223 05/11/2015 0435   MCV 82.5 05/11/2015 0435   MCH 27.0 05/11/2015 0435   MCHC 32.8 05/11/2015 0435   RDW 13.2 05/11/2015 0435   LYMPHSABS 2.6 05/08/2015 1705   LYMPHSABS 2.5 05/08/2015 1705   MONOABS 0.6 05/08/2015 1705   MONOABS 0.6 05/08/2015 1705   EOSABS 0.1 05/08/2015 1705   EOSABS 0.2 05/08/2015 1705   BASOSABS 0.0 05/08/2015 1705   BASOSABS 0.0 05/08/2015 1705      Assessment:  1. CKD 4 sec FSGS, renal fx stable.  Output incomplete 2. Cardiomyopathy and atypical CP, cath neg 3. Hx DVT 4. Sec HPTH last PTH 01/31/15 5. Anemia has not required ESA, check iron studies  Plan: 1. Daily Scr 2. Resume lasix today 3. He should FU with ophthalmologist as outpt 4. Will give dose IV iron Summar Mcglothlin T

## 2015-05-11 NOTE — Clinical Documentation Improvement (Signed)
Cardiology Nephrology/Renal  Can the diagnosis of anemia be further specified?   Anemia in chronic kidney disease  Chronic Anemia, including the suspected or known cause  Other Anemia of chronic disease, including the associated chronic disease state  Other  Clinically Undetermined  Supporting information: Component      RBC Hemoglobin HCT  Latest Ref Rng      4.22 - 5.81 MIL/uL 13.0 - 17.0 g/dL 34.2 - 87.6 %  81/15/7262     5:05 PM 3.86 (L) 10.3 (L) 31.6 (L)  05/08/2015     5:05 PM 3.84 (L) 10.3 (L) 31.6 (L)  05/09/2015     4:30 AM 3.55 (L) 9.7 (L) 29.2 (L)  05/10/2015     4:20 AM 3.47 (L) 9.5 (L) 28.3 (L)  05/11/2015      3.48 (L) 9.4 (L) 28.7 (L)     Please exercise your independent, professional judgment when responding. A specific answer is not anticipated or expected.Please update your documentation within the medical record to reflect your response to this query.  Thank you, Doy Mince, RN [phone] Clinical Documentation Specialist

## 2015-05-11 NOTE — Progress Notes (Signed)
Patient had a headache earlier in the shift and tylenol was given but ineffective. Notified on-call doctor, Dr. Gae Gallop. Orders given for one time dose of norco 5/325mg . Will administer as ordered and continue to monitor patient to end of shift.

## 2015-05-11 NOTE — Progress Notes (Signed)
Pt a/o, no c/o pain, VSS, pt stable 

## 2015-05-11 NOTE — Progress Notes (Signed)
Patient Name: Charles Montgomery Date of Encounter: 05/11/2015   SUBJECTIVE  Complains of headache today. States that this be ongoing for more than a month. Seen by ophthalmologist and says "some in wrong with blood vessels". No chest pain or sob.   CURRENT MEDS . apixaban  2.5 mg Oral BID  . carvedilol  25 mg Oral BID WC  . Dulaglutide  1.5 mg Subcutaneous Q Wed-1800  . fenofibrate  54 mg Oral Daily  . ferumoxytol  510 mg Intravenous Once  . furosemide  40 mg Oral BID  . hydrALAZINE  50 mg Oral 3 times per day  . insulin aspart  0-15 Units Subcutaneous TID WC  . insulin aspart  0-5 Units Subcutaneous QHS  . insulin aspart  30 Units Subcutaneous TID AC  . insulin detemir  50 Units Subcutaneous BID  . isosorbide mononitrate  30 mg Oral Daily  . nicotine  14 mg Transdermal Daily  . sodium chloride  3 mL Intravenous Q12H    OBJECTIVE  Filed Vitals:   05/10/15 1230 05/10/15 1609 05/10/15 2145 05/11/15 0501  BP: 107/41 127/81 126/86 119/70  Pulse: 56  92 89  Temp: 98 F (36.7 C)  98.5 F (36.9 C) 98.2 F (36.8 C)  TempSrc: Oral  Oral Oral  Resp: 20  20 1   Height:      Weight:    330 lb (149.687 kg)  SpO2: 95%  100% 100%    Intake/Output Summary (Last 24 hours) at 05/11/15 0851 Last data filed at 05/11/15 0237  Gross per 24 hour  Intake    480 ml  Output    900 ml  Net   -420 ml   Filed Weights   05/09/15 0505 05/10/15 0719 05/11/15 0501  Weight: 323 lb 14.4 oz (146.92 kg) 324 lb 14.4 oz (147.374 kg) 330 lb (149.687 kg)    PHYSICAL EXAM  General: Pleasant, obese male sitting in chair NAD. Neuro: Alert and oriented X 3. Moves all extremities spontaneously. Psych: Normal affect. HEENT:  Normal  Neck: Supple without bruits or JVD. Lungs:  Resp regular and unlabored, CTA. Heart: RRR no s3, s4, or murmurs. Abdomen: Soft, non-tender, non-distended, BS + x 4.  Extremities: No clubbing, cyanosis or edema. DP/PT/Radials 2+ and equal bilaterally. R radial cath site  without hematoma.   Accessory Clinical Findings  CBC  Recent Labs  05/08/15 1705  05/10/15 0420 05/11/15 0435  WBC 6.9  7.0  < > 5.5 5.6  NEUTROABS 3.6  3.6  --   --   --   HGB 10.3*  10.3*  < > 9.5* 9.4*  HCT 31.6*  31.6*  < > 28.3* 28.7*  MCV 81.9  82.3  < > 81.6 82.5  PLT 265  269  < > 232 223  < > = values in this interval not displayed. Basic Metabolic Panel  Recent Labs  05/08/15 1705 05/09/15 1035 05/10/15 0420 05/11/15 0435  NA 140  140 141 139 138  K 4.1  4.1 3.5 3.5 3.7  CL 103  103 108 105 106  CO2 28  28 27 26 24   GLUCOSE 135*  136* 110* 211* 118*  BUN 42*  41* 39* 37* 29*  CREATININE 3.97*  3.92* 3.86* 3.68* 3.28*  CALCIUM 9.2  9.1 8.9 9.0 8.7*  MG 2.1 2.2  --   --    Liver Function Tests  Recent Labs  05/08/15 1705  AST 21  19  ALT 12*  14*  ALKPHOS 81  80  BILITOT 0.4  0.5  PROT 6.6  6.8  ALBUMIN 3.0*  3.0*   No results for input(s): LIPASE, AMYLASE in the last 72 hours. Cardiac Enzymes  Recent Labs  05/08/15 1705 05/08/15 2256 05/09/15 0430  TROPONINI <0.03 <0.03 <0.03  Hemoglobin A1C  Recent Labs  05/08/15 1847  HGBA1C 8.3*   TELE  Sinus rhythm  Left Heart Cath and Coronary Angiography 05/10/2015    Conclusion    1. Ramus lesion, 15% stenosed. 2. Dist LAD lesion, 20% stenosed.   Widely patent coronary arteries  Elevated left ventricular end-diastolic pressure  Left ventriculography was not performed  Total contrast used less than 30 cc   RECOMMENDATIONS:  Per treating team  Resume IV heparin/Apixaban as appropriate per treating team     Radiology/Studies  No results found.  ASSESSMENT AND PLAN  1. Chest pain - negative cardiac enzymes. S/P cath with nonobstructive ASCAD with 20% distal LAD and 15% ramus. Non cardiac in nature. Now resolved.  - D/C ASA since starting Eliquis.  2. CKD (chronic kidney disease) - BMET pre cath today 3.68. Trending down, today 3.28.  Continue  IVF and monitor creatinine. Will need to stay until Friday per renal.  3. Hypertension -Stable.  Continue coreg and  Hydralazine to  TID.   4. 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  - No Heart Failure and exam.  - Last echo 12/2014 showed LV ef of 20-25%.  - continue Hydralazine/Imdur and BB. No ACE I or ARB due to CKD.   5. Cigarette nicotine dependence, uncomplicated - Smoking cessation recommended. Education given. Spent at least 10 mins counseling for significant lifestyle modification including smoking cessation, healthy diet, regular exercise and their importance. Advice to gradually increase strength and regimen.   6. ETOH abuse - Patient says he quit drinking alcohol  7. DM - HgbA1c of 8.3. Continue SSI.   8. Chronic DVT - on  Eliquis 2.5mg .  9. Headache - ?Migraine vs eye etiology. F/u with PCP  Signed, Manson Passey PA-C Pager [phone]

## 2015-05-12 LAB — GLUCOSE, CAPILLARY
Glucose-Capillary: 137 mg/dL — ABNORMAL HIGH (ref 65–99)
Glucose-Capillary: 107 mg/dL — ABNORMAL HIGH (ref 65–99)

## 2015-05-12 LAB — CBC
HEMATOCRIT: 29.2 % — AB (ref 39.0–52.0)
Hemoglobin: 9.8 g/dL — ABNORMAL LOW (ref 13.0–17.0)
MCH: 27.4 pg (ref 26.0–34.0)
MCHC: 33.6 g/dL (ref 30.0–36.0)
MCV: 81.6 fL (ref 78.0–100.0)
Platelets: 242 10*3/uL (ref 150–400)
RBC: 3.58 MIL/uL — ABNORMAL LOW (ref 4.22–5.81)
RDW: 13.3 % (ref 11.5–15.5)
WBC: 6.1 10*3/uL (ref 4.0–10.5)

## 2015-05-12 LAB — BASIC METABOLIC PANEL
ANION GAP: 8 (ref 5–15)
BUN: 30 mg/dL — ABNORMAL HIGH (ref 6–20)
CALCIUM: 9.2 mg/dL (ref 8.9–10.3)
CO2: 25 mmol/L (ref 22–32)
Chloride: 107 mmol/L (ref 101–111)
Creatinine, Ser: 3.71 mg/dL — ABNORMAL HIGH (ref 0.61–1.24)
GFR calc Af Amer: 21 mL/min — ABNORMAL LOW (ref >60)
GFR calc non Af Amer: 18 mL/min — ABNORMAL LOW (ref >60)
GLUCOSE: 113 mg/dL — AB (ref 65–99)
POTASSIUM: 3.8 mmol/L (ref 3.5–5.1)
Sodium: 140 mmol/L (ref 135–145)

## 2015-05-12 MED ORDER — HYDRALAZINE HCL 50 MG PO TABS
50.0000 mg | ORAL_TABLET | Freq: Three times a day (TID) | ORAL | Status: DC
Start: 2015-05-12 — End: 2016-06-02

## 2015-05-12 MED ORDER — NICOTINE 14 MG/24HR TD PT24: 14.0000 mg | MEDICATED_PATCH | Freq: Every day | TRANSDERMAL | Status: DC

## 2015-05-12 NOTE — Progress Notes (Signed)
SUBJECTIVE:  No complaints.  Wants to go home.  OBJECTIVE:   Vitals:   Filed Vitals:   05/11/15 0501 05/11/15 1142 05/11/15 2100 05/12/15 0500  BP: 119/70 137/87 146/85 147/83  Pulse: 89 89 96 102  Temp: 98.2 F (36.8 C) 97.9 F (36.6 C) 98 F (36.7 C) 98 F (36.7 C)  TempSrc: Oral Oral Oral Oral  Resp: 1 22 22 18   Height:      Weight: 330 lb (149.687 kg)   328 lb 8 oz (149.007 kg)  SpO2: 100% 98% 96% 98%   I&O's:   Intake/Output Summary (Last 24 hours) at 05/12/15 1017 Last data filed at 05/12/15 0221  Gross per 24 hour  Intake    720 ml  Output   1950 ml  Net  -1230 ml   TELEMETRY: Reviewed telemetry pt in NSR:     PHYSICAL EXAM General: Well developed, well nourished, in no acute distress Head: Eyes PERRLA, No xanthomas.   Normal cephalic and atramatic  Lungs:   Clear bilaterally to auscultation and percussion. Heart:   HRRR S1 S2 Pulses are 2+ & equal. Abdomen: Bowel sounds are positive, abdomen soft and non-tender without masses Extremities:   No clubbing, cyanosis or edema.  DP +1 Neuro: Alert and oriented X 3. Psych:  Good affect, responds appropriately   LABS: Basic Metabolic Panel:  Recent Labs  51/02/58 1035  05/11/15 0435 05/12/15 0503  NA 141  < > 138 140  K 3.5  < > 3.7 3.8  CL 108  < > 106 107  CO2 27  < > 24 25  GLUCOSE 110*  < > 118* 113*  BUN 39*  < > 29* 30*  CREATININE 3.86*  < > 3.28* 3.71*  CALCIUM 8.9  < > 8.7* 9.2  MG 2.2  --   --   --   < > = values in this interval not displayed. Liver Function Tests: No results for input(s): AST, ALT, ALKPHOS, BILITOT, PROT, ALBUMIN in the last 72 hours. No results for input(s): LIPASE, AMYLASE in the last 72 hours. CBC:  Recent Labs  05/11/15 0435 05/12/15 0503  WBC 5.6 6.1  HGB 9.4* 9.8*  HCT 28.7* 29.2*  MCV 82.5 81.6  PLT 223 242   Cardiac Enzymes: No results for input(s): CKTOTAL, CKMB, CKMBINDEX, TROPONINI in the last 72 hours. BNP: Invalid input(s):  POCBNP D-Dimer: No results for input(s): DDIMER in the last 72 hours. Hemoglobin A1C: No results for input(s): HGBA1C in the last 72 hours. Fasting Lipid Panel: No results for input(s): CHOL, HDL, LDLCALC, TRIG, CHOLHDL, LDLDIRECT in the last 72 hours. Thyroid Function Tests: No results for input(s): TSH, T4TOTAL, T3FREE, THYROIDAB in the last 72 hours.  Invalid input(s): FREET3 Anemia Panel:  Recent Labs  05/09/15 1035  FERRITIN 201  TIBC 262  IRON 53   Coag Panel:   Lab Results  Component Value Date   INR 1.08 05/08/2015   INR 2.24* 01/08/2015   INR 1.54* 01/07/2015    RADIOLOGY: No results found.  ASSESSMENT AND PLAN  1. Chest pain - negative cardiac enzymes. S/P cath with nonobstructive ASCAD with 20% distal LAD and 15% ramus. Non cardiac in nature. Now resolved.  - D/C ASA since on Eliquis.  2. CKD (chronic kidney disease) - BMET pre cath  3.68. Trending upward, today 3.71 which is near his baseline. Probable d/c home today.  Will check with renal.  3. Hypertension -Stable.  Continue coreg and Hydralazine to  $Remo TID.   4. 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  - No Heart Failure and exam.  - Last echo 12/2014 showed LV ef of 20-25%. He has repeat echo pending in January on maximum medical therapy.  - continue Hydralazine/Imdur and BB. No ACE I or ARB due to CKD.   5. Cigarette nicotine dependence, uncomplicated - Smoking cessation recommended. Education given. Spent at least 10 mins counseling for significant lifestyle modification including smoking cessation, healthy diet, regular exercise and their importance. Advice to gradually increase strength and regimen.   6. ETOH abuse - Patient says he quit drinking alcohol  7. DM - HgbA1c of 8.3. Continue SSI.   8. Chronic DVT - on Eliquis 2.5mg .  9. Headache - ?Migraine vs eye etiology. F/u with PCP  Patient stable from a cardiac standpoint for discharge.  Will check  with renal but creatinine at baseline so should be ok for discharge.    Charles Reichert, MD  05/12/2015  6:48 AM

## 2015-05-12 NOTE — Discharge Summary (Signed)
Discharge Summary   Patient ID: Charles Montgomery,  MRN: [id number], DOB/AGE: 48/24/1967 49 y.o.  Admit date: 05/08/2015 Discharge date: 05/12/2015  Primary Care Provider: NWGNFA,OZHYQM Primary Cardiologist: Dr. Mayford Knife  Discharge Diagnoses    Acute renal failure syndrome (HCC)   Hypertension   CKD (chronic kidney disease), stage IV sec FSGS   DCM (dilated cardiomyopathy) (HCC)   Chest pain   Cigarette nicotine dependence, uncomplicated   ETOH abuse   Chronic systolic heart failure (HCC)   DM    Sinus arrhythmia   Chronic DVT   Headache  Allergies No Known Allergies  Consultant: Nephrology  Procedures  Left Heart Cath and Coronary Angiography 05/10/2015    Conclusion    1. Ramus lesion, 15% stenosed. 2. Dist LAD lesion, 20% stenosed.   Widely patent coronary arteries  Elevated left ventricular end-diastolic pressure  Left ventriculography was not performed  Total contrast used less than 30 cc   RECOMMENDATIONS:  Per treating team  Resume IV heparin/Apixaban as appropriate per treating team     History of Present Illness  Charles Montgomery is a 49 y.o. male with history of newly diagnosed dilated cardiomyopathy of unknown etiology, possibly due to hypertension and or EtOH and cocaine use. EF 20%. CK D precludes cath. He also has hypertension, dyslipidemia, CK D with a creatinine of 4 and atypical chest pain. On Eliquis for chronic DVT.   She was seen by Dr. Mayford Knife in 04/12/15. Carotid Dopplers were ordered and showed 1-39% bilateral carotid artery stenosis. Rest Myoview showed moderately decreased EF of 30-44% nuclear stress 37% there was no ST segment deviation during stress test This was a need intermediate study with a large severe partially reversible inferior lateral defect consistent with prior infarct and moderate peri-infarct ischemia EF 37% with global hypokinesis mild LVE.  Patient quite drinking and doing cocaine. He has back on his smoking  but is still smoking about half pack a day. He complains of constant chest tightness which is worse with little exertion during office visit 05/08/2015. It has not improved on the medications.The case discussed with Dr. Mayford Knife and Dr.Nahser both recommend admission with hydration, renal consult and possible cardiac catheterization.   Hospital Course Nephrology consulted as the patient is very high risk for contrast induced nephropathy. Held Apixaben and started on Iv heparin. The patient was seen by nephrology and felt high for worsening renal function. Held lisinopril and lasix and started on gentle hydration given LV dysfunction. The patient aggred to processed with LHC cath. Eliquis held for 48 hours prior to cath. Trop x 3 negative. 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 Elevated LVEDP. Discontinued ASA since starting Eliquis. His hydralazine increased to 50mg  TID due to elevated blood pressure. Renal function monitored for 48 hours post cath per renal. Creatinine continue to trend downwards, however increase slightly ( at baseline) at day of discharge after restarting lasix.   She has been seen by Dr. Mayford Knife and  Dr. Kathrene Bongo today and deemed ready for discharge home. All follow-up appointments have been scheduled. Discharge medications are listed below.   Renal will arrange outpatient lab to check creatinine and f/u appointment. Will need taper continuation of nicotine rx at office visit. Continue Hydralazine/Imdur and BB. No ACE I or ARB due to CKD.  No ASA while on Eliquis. Continue Lasix po 40mg  BID per Dr. Kathrene Bongo. He will follow up with PCP/ophthalmologist as outpatient for headache.   Discharge Vitals Blood pressure 147/83, pulse 102, temperature  98 F (36.7 C), temperature source Oral, resp. rate 18, height  (1.753 m), weight 328 lb 8 oz (149.007 kg), SpO2 98 %.  Filed Weights   05/10/15 0719 05/11/15 0501 05/12/15 0500  Weight: 324 lb 14.4  oz (147.374 kg) 330 lb (149.687 kg) 328 lb 8 oz (149.007 kg)    Labs  CBC  Recent Labs  05/11/15 0435 05/12/15 0503  WBC 5.6 6.1  HGB 9.4* 9.8*  HCT 28.7* 29.2*  MCV 82.5 81.6  PLT 223 242   Basic Metabolic Panel  Recent Labs  05/09/15 1035  05/11/15 0435 05/12/15 0503  NA 141  < > 138 140  K 3.5  < > 3.7 3.8  CL 108  < > 106 107  CO2 27  < > 24 25  GLUCOSE 110*  < > 118* 113*  BUN 39*  < > 29* 30*  CREATININE 3.86*  < > 3.28* 3.71*  CALCIUM 8.9  < > 8.7* 9.2  MG 2.2  --   --   --   < > = values in this interval not displayed.  Disposition  Pt is being discharged home today in good condition.  Follow-up Plans & Appointments  Follow-up Information    Follow up with SIMMONS, BRITTAINY, PA-C. Go on 06/01/2015.   Specialties:  Cardiology, Radiology   Why:  :00 for post hospital    Contact information:   [address] [place] [address] [phone]       Follow up with Lauris Poag, MD.   Specialty:  Nephrology   Why:  office will call with appoinment and renal function lab    Contact information:   [address]                          [place] [address] [phone]       Follow up with Georgann Housekeeper, MD. Schedule an appointment as soon as possible for a visit in 1 week.   Specialty:  Internal Medicine   Why:  for headache   Contact information:   [address] [place] [address] [phone]       Discharge Instructions    Diet - low sodium heart healthy    Complete by:  As directed      Discharge instructions    Complete by:  As directed   No lifting over 5 lbs for 1 week. No sexual activity for 1 week. Keep procedure site clean & dry. If you notice increased pain, swelling, bleeding or pus, call/return!  You may shower, but no soaking baths/hot tubs/pools for 1 week.     Increase activity slowly    Complete by:  As directed            F/u Labs/Studiesl: BMET at nephrology office  Discharge  Medications    Medication List    STOP taking these medications        aspirin EC 81 MG tablet     lisinopril 5 MG tablet  Commonly known as:  PRINIVIL,ZESTRIL      TAKE these medications        carvedilol 25 MG tablet  Commonly known as:  COREG  Take 1 tablet (25 mg total) by mouth 2 (two) times daily with a meal.     CLEVER CHOICE GLUCOSE CONTROL HIGH Liqd  by In Vitro route.     ELIQUIS 2.5 MG Tabs tablet  Generic  drug:  apixaban  Take 2.5 mg by mouth 2 (two) times daily.     fenofibrate 48 MG tablet  Commonly known as:  TRICOR  Take 48 mg by mouth 3 (three) times daily. Reported on 05/08/2015     furosemide 40 MG tablet  Commonly known as:  LASIX  Take 1 tablet (40 mg total) by mouth 2 (two) times daily.     hydrALAZINE 50 MG tablet  Commonly known as:  APRESOLINE  Take 1 tablet (50 mg total) by mouth 3 (three) times daily.     isosorbide mononitrate 30 MG 24 hr tablet  Commonly known as:  IMDUR  Take 1 tablet (30 mg total) by mouth daily.     KLOR-CON M20 20 MEQ tablet  Generic drug:  potassium chloride SA  Take 20 mEq by mouth daily.     LEVEMIR 100 UNIT/ML injection  Generic drug:  insulin detemir  Inject 50 Units into the skin 2 (two) times daily.     nicotine 14 mg/24hr patch  Commonly known as:  NICODERM CQ - dosed in mg/24 hours  Place 1 patch (14 mg total) onto the skin daily.     NOVOLOG 100 UNIT/ML injection  Generic drug:  insulin aspart  Inject 30 Units into the skin 3 (three) times daily before meals.     TRULICITY 1.5 MG/0.5ML Sopn  Generic drug:  Dulaglutide  Inject 1.5 mg into the skin once a week.        Duration of Discharge Encounter   Greater than 30 minutes including physician time.  Signed, Braden Cimo PA-C 05/12/2015, 9:18 AM

## 2015-05-12 NOTE — Progress Notes (Signed)
S: CO no new complaints- wants to go home, not uremic- wants nicotine patch to go home with  O:BP 147/83 mmHg  Pulse 102  Temp(Src) 98 F (36.7 C) (Oral)  Resp 18  Ht 5\' 9"  (1.753 m)  Wt 149.007 kg (328 lb 8 oz)  BMI 48.49 kg/m2  SpO2 98%  Intake/Output Summary (Last 24 hours) at 05/12/15 0829 Last data filed at 05/12/15 0800  Gross per 24 hour  Intake    720 ml  Output   2250 ml  Net  -1530 ml   Weight change: 1.633 kg (3 lb 9.6 oz) DTH:YHOOI and alert CVS:RRR Resp:clear Abd:+ BS NTND Ext:no edema NEURO: CNI Ox3 no asterixis   . apixaban  2.5 mg Oral BID  . carvedilol  25 mg Oral BID WC  . Dulaglutide  1.5 mg Subcutaneous Q Wed-1800  . fenofibrate  54 mg Oral Daily  . furosemide  40 mg Oral BID  . hydrALAZINE  50 mg Oral 3 times per day  . insulin aspart  0-15 Units Subcutaneous TID WC  . insulin aspart  0-5 Units Subcutaneous QHS  . insulin aspart  30 Units Subcutaneous TID AC  . insulin detemir  50 Units Subcutaneous BID  . isosorbide mononitrate  30 mg Oral Daily  . nicotine  14 mg Transdermal Daily  . sodium chloride  3 mL Intravenous Q12H   No results found. BMET    Component Value Date/Time   NA 140 05/12/2015 0503   K 3.8 05/12/2015 0503   CL 107 05/12/2015 0503   CO2 25 05/12/2015 0503   GLUCOSE 113* 05/12/2015 0503   BUN 30* 05/12/2015 0503   CREATININE 3.71* 05/12/2015 0503   CREATININE 3.90* 04/12/2015 1515   CALCIUM 9.2 05/12/2015 0503   GFRNONAA 18* 05/12/2015 0503   GFRAA 21* 05/12/2015 0503   CBC    Component Value Date/Time   WBC 6.1 05/12/2015 0503   RBC 3.58* 05/12/2015 0503   HGB 9.8* 05/12/2015 0503   HCT 29.2* 05/12/2015 0503   PLT 242 05/12/2015 0503   MCV 81.6 05/12/2015 0503   MCH 27.4 05/12/2015 0503   MCHC 33.6 05/12/2015 0503   RDW 13.3 05/12/2015 0503   LYMPHSABS 2.6 05/08/2015 1705   LYMPHSABS 2.5 05/08/2015 1705   MONOABS 0.6 05/08/2015 1705   MONOABS 0.6 05/08/2015 1705   EOSABS 0.1 05/08/2015 1705   EOSABS 0.2  05/08/2015 1705   BASOSABS 0.0 05/08/2015 1705   BASOSABS 0.0 05/08/2015 1705     Assessment:  1. CKD 4 sec FSGS, renal fx stable.  Output incomplete 2. Cardiomyopathy and atypical CP, cath neg 3. Hx DVT 4. Sec HPTH last PTH 01/31/15 5. Anemia has not required ESA, check iron studies  Plan: 1. Creatinine fairly stable- not uremic OK for discharge- I will arrange follow up labs thru our office and follow up with Dr. Lowell Guitar 2. Lasix resumed 3. He should FU with ophthalmologist as outpt 4. Given IV iron here   Chancy Smigiel A

## 2015-05-12 NOTE — Progress Notes (Signed)
Pt discharge education and instructions completed with pt; pt voices understanding and denies any questions. Pt IV and telemetry removed; pt discharge home; pt handed his prescription for hydralazine; and to pick up nicotine patch from preferred pharmacy on file. Pt offered wheelchair but he declines and ambulated off unit with belongings to the side. Arabella Merles Deziyah Arvin RN.

## 2015-06-01 ENCOUNTER — Ambulatory Visit (INDEPENDENT_AMBULATORY_CARE_PROVIDER_SITE_OTHER): Payer: BLUE CROSS/BLUE SHIELD | Admitting: Cardiology

## 2015-06-01 ENCOUNTER — Encounter: Payer: Self-pay | Admitting: Cardiology

## 2015-06-01 VITALS — BP 112/78 | HR 98 | Ht 69.5 in | Wt 330.1 lb

## 2015-06-01 DIAGNOSIS — I5022 Chronic systolic (congestive) heart failure: Secondary | ICD-10-CM | POA: Diagnosis not present

## 2015-06-01 NOTE — Progress Notes (Signed)
06/01/2015 Charles Montgomery   [birth year]  [id number]  Primary Physician Georgann Housekeeper, MD Primary Cardiologist: Dr. Mayford Knife   Reason for Visit/CC:  Post Cath F/u; Chronic Systolic CHF  HPI:   [name] presents to clinic today for post hospital follow-up after recent admission for acute on chronic CHF.  He is a 50 year old [place] American male, followed by Dr. Mayford Knife , with a history of  DVT on chronic anticoagulation with Eliquis, also with a prior history of polysubstance abuse (tobacco, alcohol and cocaine).  In August of 2016, he was diagnosed with systolic heart failure, after presenting to  [facility] with an acute exacerbation. 2D echo at that time showed severe LV systolic dysfunction with an EF of 20-25%.  Cardiac cath was not pursued at that time given chronic kidney disease and risk for worsening renal function/ need for HD.  It was also felt that this was likely dilated cardiomyopathy due to hypertensive heart disease. He was treated medically.  He presents back to [facility] on 05/08/15 with another acute CHF exacerbation and also complained of chest pain. Cardiac enzymes were negative. Nuclear stress test was ordered which showed a large inferior infarct with peri-infarct ischemia.  It was then decided to re-visit the idea of a left heart catheterization to rule out coronary artery disease. Nephrology was consulted  and decision was made by all parties, including the patient, to proceed with left heart catheterization despite potential risk. This was performed by Dr. [place] and he was found to nonobstructive CAD and the diagnosis of NICM was confirmed.  For the most part, his serum creatinine remained fairly stable in the 3 range. He was discharged home with plans to follow-up with nephrology for repeat labs. He is scheduled to follow-up with nephrology on 06/15/2015.  Today in clinic, he reports that he has done fairly well. He denies any recurrent chest pain.  He continues to feel short of breath but denies any weight gain or lower extremity edema. His weight at time of discharge was 328 pounds. His weight today in clinic is 330 pounds. He does complain of dyspnea at night while sleeping and often wakes up short of breath gasping for air. He feels that this may also be possible sleep apnea. He is scheduled for a sleep study on 06/15/2015. He has quit all drugs, including cocaine and ETOH. He is fully compliant with all meds. Still with good UOP.      Current Outpatient Prescriptions  Medication Sig Dispense Refill  . apixaban (ELIQUIS) 2.5 MG TABS tablet Take 2.5 mg by mouth 2 (two) times daily.    . Blood Glucose Calibration (CLEVER CHOICE GLUCOSE CONTROL) HIGH LIQD by In Vitro route.    . carvedilol (COREG) 25 MG tablet Take 1 tablet (25 mg total) by mouth 2 (two) times daily with a meal. 60 tablet 11  . Dulaglutide (TRULICITY) 1.5 MG/0.5ML SOPN Inject 1.5 mg into the skin once a week.     . fenofibrate (TRICOR) 48 MG tablet Take 48 mg by mouth 3 (three) times daily. Reported on 05/08/2015    . furosemide (LASIX) 40 MG tablet Take 1 tablet (40 mg total) by mouth 2 (two) times daily. 60 tablet 0  . hydrALAZINE (APRESOLINE) 50 MG tablet Take 1 tablet (50 mg total) by mouth 3 (three) times daily. 90 tablet 6  . insulin aspart (NOVOLOG) 100 UNIT/ML injection Inject 30 Units into the skin 3 (three) times daily before meals.    . insulin  detemir (LEVEMIR) 100 UNIT/ML injection Inject 50 Units into the skin 2 (two) times daily.     Marland Kitchen KLOR-CON M20 20 MEQ tablet Take 20 mEq by mouth daily.     . nicotine (NICODERM CQ - DOSED IN MG/24 HOURS) 14 mg/24hr patch Place 1 patch (14 mg total) onto the skin daily. 28 patch 0   No current facility-administered medications for this visit.    No Known Allergies  Social History   Social History  . Marital Status: Married    Spouse Name: N/A  . Number of Children: N/A  . Years of Education: N/A   Occupational  History  . Not on file.   Social History Main Topics  . Smoking status: Current Every Day Smoker -- 0.25 packs/day for 30 years    Types: Cigarettes  . Smokeless tobacco: Not on file  . Alcohol Use: Yes     Comment: rarely  . Drug Use: No  . Sexual Activity: Not on file   Other Topics Concern  . Not on file   Social History Narrative     Review of Systems: General: negative for chills, fever, night sweats or weight changes.  Cardiovascular: negative for chest pain, dyspnea on exertion, edema, orthopnea, palpitations, paroxysmal nocturnal dyspnea or shortness of breath Dermatological: negative for rash Respiratory: negative for cough or wheezing Urologic: negative for hematuria Abdominal: negative for nausea, vomiting, diarrhea, bright red blood per rectum, melena, or hematemesis Neurologic: negative for visual changes, syncope, or dizziness All other systems reviewed and are otherwise negative except as noted above.    Blood pressure 112/78, pulse 98, height 5' 9.5" (1.765 m), weight 330 lb 1.9 oz (149.741 kg), SpO2 97 %.  General appearance: alert, cooperative and no distress, obese Neck: no carotid bruit and no JVD Lungs: clear to auscultation bilaterally Heart: regular rate and rhythm, S1, S2 normal, no murmur, click, rub or gallop Extremities: no LEE Pulses: 2+ and symmetric Skin: warm and dry Neurologic: Grossly normal  EKG not performed  ASSESSMENT AND PLAN:    1.  Chronic systolic heart failure/nonischemic cardiomyopathy:  EF 20-25% on echo in August 2016. Recent left heart catheterization showed nonobstructive CAD.  Suspect hypertensive dilated cardiomyopathy. He is euvolemic on physical exam. Weight is stable at 330 pounds (dry weight is 328) BP is well-controlled. He is on the maximum dose of carvedilol at 25 mg twice a day. He is also on hydralazine  50 mg 3 times a day and reports full medication compliance. He is not on an ACE or ARB secondary to chronic  kidney disease. Plan is for repeat assessment of EF 06/15/2015 by echo.  If no improvement beyond 35%, he will need referral to EP clinic for consideration for ICD. The patient reports that he has refrained from further use of cocaine and alcohol.   2. Chronic Kidney disease:  Serum creatinine in the 3 range. He is followed by nephrology and has a follow-up appt on 06/15/2015.  3. Hypertension: Blood pressure is well controlled on Coreg and hydralazine.    4. Polysubstance abuse: Patient reports that he has quit tobacco, alcohol and cocaine use. He was congratulated on his efforts and encouraged to continue to refrain from substance use.  5. Questionable obstructive sleep apnea:  Pt reports apneic spells during sleep.  His body habitus also place him at increased risk for this. He is scheduled for outpatient sleep study 06/15/2015.  PLAN  F/u after 2D echo to discuss further plans for HF and  possibility for ICD.   Deandrea Vanpelt PA-C 06/01/2015 9:15 AM

## 2015-06-01 NOTE — Patient Instructions (Signed)
Medication Instructions:  Your physician recommends that you continue on your current medications as directed. Please refer to the Current Medication list given to you today.   Labwork: None ordered  Testing/Procedures: Please have your echo as planned   Follow-Up: Your physician recommends that you schedule a follow-up appointment in: 3 weeks with Robbie Lis, PA   Any Other Special Instructions Will Be Listed Below (If Applicable).     If you need a refill on your cardiac medications before your next appointment, please call your pharmacy.

## 2015-06-05 ENCOUNTER — Ambulatory Visit (HOSPITAL_COMMUNITY): Payer: BLUE CROSS/BLUE SHIELD | Attending: Cardiovascular Disease

## 2015-06-05 ENCOUNTER — Other Ambulatory Visit: Payer: Self-pay

## 2015-06-05 DIAGNOSIS — I10 Essential (primary) hypertension: Secondary | ICD-10-CM | POA: Insufficient documentation

## 2015-06-05 DIAGNOSIS — I517 Cardiomegaly: Secondary | ICD-10-CM | POA: Diagnosis not present

## 2015-06-05 DIAGNOSIS — I420 Dilated cardiomyopathy: Secondary | ICD-10-CM | POA: Insufficient documentation

## 2015-06-15 ENCOUNTER — Ambulatory Visit (HOSPITAL_BASED_OUTPATIENT_CLINIC_OR_DEPARTMENT_OTHER): Payer: BLUE CROSS/BLUE SHIELD | Attending: Cardiology

## 2015-06-15 VITALS — Ht 70.0 in | Wt 328.0 lb

## 2015-06-15 DIAGNOSIS — Z794 Long term (current) use of insulin: Secondary | ICD-10-CM | POA: Insufficient documentation

## 2015-06-15 DIAGNOSIS — G4733 Obstructive sleep apnea (adult) (pediatric): Secondary | ICD-10-CM | POA: Diagnosis not present

## 2015-06-15 DIAGNOSIS — R0683 Snoring: Secondary | ICD-10-CM

## 2015-06-15 DIAGNOSIS — G4719 Other hypersomnia: Secondary | ICD-10-CM | POA: Diagnosis not present

## 2015-06-15 DIAGNOSIS — E669 Obesity, unspecified: Secondary | ICD-10-CM | POA: Diagnosis not present

## 2015-06-15 DIAGNOSIS — Z6841 Body Mass Index (BMI) 40.0 and over, adult: Secondary | ICD-10-CM | POA: Insufficient documentation

## 2015-06-15 DIAGNOSIS — G4736 Sleep related hypoventilation in conditions classified elsewhere: Secondary | ICD-10-CM | POA: Insufficient documentation

## 2015-06-15 DIAGNOSIS — Z79899 Other long term (current) drug therapy: Secondary | ICD-10-CM | POA: Insufficient documentation

## 2015-06-15 DIAGNOSIS — R000 Tachycardia, unspecified: Secondary | ICD-10-CM | POA: Insufficient documentation

## 2015-06-26 ENCOUNTER — Encounter: Payer: Self-pay | Admitting: Cardiology

## 2015-06-26 ENCOUNTER — Encounter (HOSPITAL_BASED_OUTPATIENT_CLINIC_OR_DEPARTMENT_OTHER): Payer: Self-pay

## 2015-06-26 ENCOUNTER — Telehealth: Payer: Self-pay | Admitting: Cardiology

## 2015-06-26 ENCOUNTER — Ambulatory Visit (INDEPENDENT_AMBULATORY_CARE_PROVIDER_SITE_OTHER): Payer: BLUE CROSS/BLUE SHIELD | Admitting: Cardiology

## 2015-06-26 VITALS — BP 120/60 | HR 96 | Ht 69.0 in | Wt 332.0 lb

## 2015-06-26 DIAGNOSIS — I5022 Chronic systolic (congestive) heart failure: Secondary | ICD-10-CM

## 2015-06-26 DIAGNOSIS — Z9989 Dependence on other enabling machines and devices: Principal | ICD-10-CM

## 2015-06-26 DIAGNOSIS — N189 Chronic kidney disease, unspecified: Secondary | ICD-10-CM

## 2015-06-26 DIAGNOSIS — G4733 Obstructive sleep apnea (adult) (pediatric): Secondary | ICD-10-CM | POA: Insufficient documentation

## 2015-06-26 DIAGNOSIS — I10 Essential (primary) hypertension: Secondary | ICD-10-CM | POA: Diagnosis not present

## 2015-06-26 DIAGNOSIS — I420 Dilated cardiomyopathy: Secondary | ICD-10-CM

## 2015-06-26 DIAGNOSIS — J029 Acute pharyngitis, unspecified: Secondary | ICD-10-CM

## 2015-06-26 HISTORY — DX: Obstructive sleep apnea (adult) (pediatric): G47.33

## 2015-06-26 MED ORDER — AMOXICILLIN 250 MG PO CAPS: 250.0000 mg | ORAL_CAPSULE | Freq: Three times a day (TID) | ORAL | Status: DC

## 2015-06-26 NOTE — Patient Instructions (Signed)
Medication Instructions:  1) START Amoxicillin 250mg  three times daily for 7 days  Labwork: None  Testing/Procedures: None  Follow-Up: Your physician recommends that you schedule a follow-up appointment in: 2-3 months with Dr. Mayford Knife for sleep apnea.   Any Other Special Instructions Will Be Listed Below (If Applicable).     If you need a refill on your cardiac medications before your next appointment, please call your pharmacy.

## 2015-06-26 NOTE — Telephone Encounter (Signed)
Please let patient know that they have significant sleep apnea and had successful CPAP titration and will be set up with CPAP unit.  Please let DME know that order is in EPIC.  Please set patient up for OV in 10 weeks 

## 2015-06-26 NOTE — Sleep Study (Addendum)
Patient Name: Charles Montgomery, Charles Montgomery MRN: [id number] Study Date: 06/15/2015 Gender: Male D.O.B: 03/28/1966 Age (years): 85 Referring Provider: Fransico Him MD, ABSM Interpreting Physician: Fransico Him MD, ABSM RPSGT: Baxter Flattery  Weight (lbs): 328 BMI: 47 Height (inches): 70 Neck Size: 20.00  CLINICAL INFORMATION Sleep Study Type: Split Night CPAP Indication for sleep study: Excessive Daytime Sleepiness, Obesity, OSA, Snoring, Witnessed Apneas   SLEEP STUDY TECHNIQUE As per the AASM Manual for the Scoring of Sleep and Associated Events v2.3 (April 2016) with a hypopnea requiring 4% desaturations. The channels recorded and monitored were frontal, central and occipital EEG, electrooculogram (EOG), submentalis EMG (chin), nasal and oral airflow, thoracic and abdominal wall motion, anterior tibialis EMG, snore microphone, electrocardiogram, and pulse oximetry. Continuous positive airway pressure (CPAP) was initiated when the patient met split night criteria and was titrated according to treat sleep-disordered breathing.  MEDICATIONS Medications taken by the patient : Apixiban, Carvedilol, Trulicity, Tricor, Lasix, Hydralazine, Humalog Insulin, Novolog Insulin, Levemir Insulin, Klor Con Medications administered by patient during sleep study : No sleep medicine administered.  RESPIRATORY PARAMETERS Diagnostic Total AHI (/hr): 118.6  RDI (/hr):118.6  OA Index (/hr): -  CA Index (/hr): 114.3 REM AHI (/hr): 65.5  NREM AHI (/hr):121.0  Supine AHI (/hr):103.9  Non-supine AHI (/hr):121.96 Min O2 Sat (%):72.00  Mean O2 (%): 91.64  Time below 88% (min):34.7    Titration Optimal Pressure (cm):20 AHI at Optimal Pressure (/hr):0.0 Min O2 at Optimal Pressure (%):98.0 Supine % at Optimal (%):0 Sleep % at Optimal (%):98    SLEEP ARCHITECTURE The recording time for the entire night was 377.1 minutes. During a baseline period of 153.2 minutes, the patient slept for 126.5 minutes in REM and  nonREM, yielding a reduced sleep efficiency of 82.6%. Sleep onset after lights out was 2.5 minutes with a REM latency of 87.5 minutes. The patient spent 5.93% of the night in stage N1 sleep, 89.72% in stage N2 sleep, 0.00% in stage N3 and 4.35% in REM.   During the titration period of 213.1 minutes, the patient slept for 164.0 minutes in REM and nonREM, yielding a reduced sleep efficiency of 76.9%. Sleep onset after CPAP initiation was 1.1 minutes with a prolonged REM latency of 141.5 minutes. The patient spent 3.66% of the night in stage N1 sleep, 84.45% in stage N2 sleep, 2.44% in stage N3 and 9.45% in REM.  CARDIAC DATA The 2 lead EKG demonstrated sinus tachycardia  The mean heart rate was 103.96 beats per minute. Other EKG findings include: None.  Heart rate ranged from  40-140bpm.   LEG MOVEMENT DATA The total Periodic Limb Movements of Sleep (PLMS) were 97. The PLMS index was 20.03 .  IMPRESSIONS - Severe obstructive sleep apnea occurred during the diagnostic portion of the study (AHI = 118.6/hour). An optimal PAP pressure was selected for this patient ( 20 cm of water) - Severe central sleep apnea occurred during the diagnostic portion of the study (CAI = (CAI = 114.3/hour). - Severe oxygen desaturation was noted during the diagnostic portion of the study (Min O2 = 72.00%).   - The patient snored with Soft snoring volume during the diagnostic portion of the study. - No cardiac abnormalities were noted during this study. - Moderate periodic limb movements of sleep occurred during the study.  DIAGNOSIS - Obstructive Sleep Apnea (327.23 [G47.33 ICD-10]) - Nocturnal Hypoxemia  RECOMMENDATIONS - Given that patient has severe OSA with primarily central sleep apnea and need for high CPAP pressure, recommend full night study with  BiPAP S/T titration.. - Avoid alcohol, sedatives and other CNS depressants that may worsen sleep apnea and disrupt normal sleep architecture. - Sleep hygiene should  be reviewed to assess factors that may improve sleep quality. - Weight management and regular exercise should be initiated or continued.   Sueanne Margarita Diplomate, American Board of Sleep Medicine  ELECTRONICALLY SIGNED ON:  06/26/2015, 8:09 PM [place] PH: [phone]   FX: [phone] [place]

## 2015-06-26 NOTE — Addendum Note (Signed)
Addended by: Quintella Reichert on: 06/26/2015 08:14 PM   Modules accepted: Orders

## 2015-06-26 NOTE — Progress Notes (Signed)
Cardiology Office Note   Date:  06/26/2015   ID:  [name], DOB 09-19-1965, MRN [id number]  PCP:  Georgann Housekeeper, MD  Cardiologist:  Dr. Mayford Knife    Chief Complaint  Patient presents with  . Cardiomyopathy    no SOB  . Sore Throat      History of Present Illness: Fannie Monastra is a 50 y.o. male who presents for follow up of NICM.  EF had been 20-25% but on recent Echo now EF 55-60%.  He has a history of DVT on chronic anticoagulation with Eliquis, also with a prior history of polysubstance abuse (tobacco, alcohol and cocaine). In August of 2016, he was diagnosed with systolic heart failure, after presenting to [facility] with an acute exacerbation. 2D echo at that time showed severe LV systolic dysfunction with an EF of 20-25%.  He eventually did undergo cardiac cath -- performed by Dr. [place] and he was found to nonobstructive CAD and the diagnosis of NICM was confirmed.  For the most part, his serum creatinine remained fairly stable in the 3 range. He was discharged home with plans to follow-up with nephrology for repeat labs. And he has seen Dr. Lowell Guitar and per his labs Cr 5.07.  Plan to arrange AV graft.   He has had sleep study- results not yet back, but he stated with nasal pillow he slept well.  He is excited to have this arranged.  Today no chest pain and no SOB.  He no longer uses tobacco, ETOH or cocaine.  He was congratulated.    He also complains of cold symptoms with congestion and sore throat.  He would like abx because historically this gets worse.  His grandkids were ill recently.  Past Medical History  Diagnosis Date  . Diabetes mellitus   . Hypertension   . High cholesterol   . GERD (gastroesophageal reflux disease)   . Hypothyroid   . Obesity   . Diabetic neuropathy (HCC)   . Hyperlipidemia   . Diabetic retinopathy of left eye (HCC)   . Systolic heart failure (HCC)   . CKD (chronic kidney disease)   . Leg DVT (deep venous  thromboembolism), chronic (HCC)   . Nephrotic syndrome   . Fatigue   . Snores   . Cigarette nicotine dependence, uncomplicated   . 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 DCM (dilated cardiomyopathy) (HCC) 04/12/2015    Past Surgical History  Procedure Laterality Date  . Cardiac catheterization N/A 05/10/2015    Procedure: Left Heart Cath and Coronary Angiography;  Surgeon: Lyn Records, MD;  Location: [facility] INVASIVE CV LAB;  Service: Cardiovascular;  Laterality: N/A;     Current Outpatient Prescriptions  Medication Sig Dispense Refill  . apixaban (ELIQUIS) 2.5 MG TABS tablet Take 2.5 mg by mouth 2 (two) times daily.    . Blood Glucose Calibration (CLEVER CHOICE GLUCOSE CONTROL) HIGH LIQD by In Vitro route.    . carvedilol (COREG) 25 MG tablet Take 1 tablet (25 mg total) by mouth 2 (two) times daily with a meal. 60 tablet 11  . Dulaglutide (TRULICITY) 1.5 MG/0.5ML SOPN Inject 1.5 mg into the skin once a week.     . fenofibrate (TRICOR) 48 MG tablet Take 48 mg by mouth 2 (two) times daily. Reported on 05/08/2015    . furosemide (LASIX) 40 MG tablet Take 1 tablet (40 mg total) by mouth 2 (two) times daily. 60 tablet 0  . HUMALOG KWIKPEN 200 UNIT/ML SOPN  Inject into the skin 3 (three) times daily as needed. Based on sliding scale: If BG is greater than 200, inject 20 units into the skin once If BG is less than 200, DO NOT use any    . hydrALAZINE (APRESOLINE) 50 MG tablet Take 1 tablet (50 mg total) by mouth 3 (three) times daily. 90 tablet 6  . insulin aspart (NOVOLOG) 100 UNIT/ML injection Inject 30 Units into the skin 3 (three) times daily as needed for high blood sugar.     . insulin detemir (LEVEMIR) 100 UNIT/ML injection Inject 50 Units into the skin 2 (two) times daily.     Marland Kitchen KLOR-CON M20 20 MEQ tablet Take 20 mEq by mouth daily.     Marland Kitchen amoxicillin (AMOXIL) 250 MG capsule Take 1 capsule (250 mg total) by mouth 3 (three) times daily. For seven  days. 21 capsule 0   No current facility-administered medications for this visit.    Allergies:   Review of patient's allergies indicates no known allergies.    Social History:  The patient  reports that he quit smoking about 6 weeks ago. His smoking use included Cigarettes. He has a 7.5 pack-year smoking history. He has never used smokeless tobacco. He reports that he does not drink alcohol or use illicit drugs.   Family History:  The patient's family history includes Deep vein thrombosis in his father; Diabetes type II in his father; Healthy in his brother and sister; Hypertension in his father; Leukemia in his mother; Renal Disease in his father.    ROS:  General:+ colds and sore throat, no fevers, no weight changes Skin:no rashes or ulcers HEENT:no blurred vision, + congestion CV:see HPI PUL:see HPI GI:no diarrhea constipation or melena, no indigestion GU:no hematuria, no dysuria MS:no joint pain, no claudication Neuro:no syncope, no lightheadedness Endo:+ diabetes stable, no thyroid disease  Wt Readings from Last 3 Encounters:  06/26/15 332 lb (150.594 kg)  06/15/15 328 lb (148.78 kg)  06/01/15 330 lb 1.9 oz (149.741 kg)     PHYSICAL EXAM: VS:  BP 120/60 mmHg  Pulse 96  Ht 5\' 9"  (1.753 m)  Wt 332 lb (150.594 kg)  BMI 49.01 kg/m2 , BMI Body mass index is 49.01 kg/(m^2). General:Pleasant affect, NAD Skin:Warm and dry, brisk capillary refill HEENT:normocephalic, sclera clear, mucus membranes moist Neck:supple, no JVD, no bruits  Heart:S1S2 RRR without murmur, gallup, rub or click Lungs:clear without rales, rhonchi, or wheezes ZOX:WRUE, non tender, + BS, do not palpate liver spleen or masses Ext:no lower ext edema, 2+ pedal pulses, 2+ radial pulses Neuro:alert and oriented, MAE, follows commands, + facial symmetry    EKG:  EKG is NOT ordered today.    Recent Labs: 05/08/2015: ALT 12*; ALT 14*; B Natriuretic Peptide 16.6 05/09/2015: Magnesium 2.2 05/12/2015: BUN  30*; Creatinine, Ser 3.71*; Hemoglobin 9.8*; Platelets 242; Potassium 3.8; Sodium 140    Lipid Panel No results found for: CHOL, TRIG, HDL, CHOLHDL, VLDL, LDLCALC, LDLDIRECT     Other studies Reviewed: Additional studies/ records that were reviewed today include:  ECHO:. Study Conclusions  - Left ventricle: The cavity size was normal. Wall thickness was increased increased in a pattern of mild to moderate LVH. Systolic function was normal. The estimated ejection fraction was in the range of 60% to 65%. Wall motion was normal; there were no regional wall motion abnormalities. - Atrial septum: No defect or patent foramen ovale was identified.   ASSESSMENT AND PLAN:  1. Chronic systolic heart failure/nonischemic cardiomyopathy: EF 20-25% on  echo in August 2016. Recent left heart catheterization showed nonobstructive CAD. 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 He is euvolemic on physical exam. Weight is stable at 330 pounds (dry weight is 328) BP is well-controlled. He is on the maximum dose of carvedilol at 25 mg twice a day. He is also on hydralazine 50 mg 3 times a day and reports full medication compliance. He is not on an ACE or ARB secondary to chronic kidney disease. The patient reports that he has refrained from further use of cocaine and alcohol and tobacco. .  NEW Echo 06/05/15 now with EF at 60-65%, mod LVH. Discussed importance of BP control.   2. Chronic Kidney disease: Serum creatinine previously in the 3 range. He is followed by nephrology  And most recent labs wit Cr at 5.07  Plans to proceed with AV graft.  3. Hypertension: Blood pressure is well controlled on Coreg and hydralazine.   4. Polysubstance abuse: Patient reports that he has quit tobacco, alcohol and cocaine use. He was congratulated on his efforts and encouraged to continue to refrain from substance use.  5.  obstructive sleep apnea: Pt reports apneic spells during sleep. His body  habitus also place him at increased risk for this. Sleep study is done and waiting for results but pt stated with nasal pillow in place he slept well.  Will have him follow up with Dr. Mayford Knife.   6. Pharyngitis- will add amoxicillin 250 mg TID for 7 days.     Current medicines are reviewed with the patient today.  The patient Has no concerns regarding medicines.  The following changes have been made:  See above Labs/ tests ordered today include:see above  Disposition:   FU:  see above  Signed, Leone Brand, NP  06/26/2015 1:59 PM    [facility] [address] [place], [place], [place]  [address]/ [address] [place], [place] Phone: [phone]; Fax: [phone]  [phone]

## 2015-06-26 NOTE — Telephone Encounter (Signed)
Correction on prior results of sleep study.  Please let patient know that he has severe OSA that is mainly central and requires a very high CPAP pressure that I am concerned that he will not tolerate.  Please refer back to sleep lab for BiPAP S/T titration

## 2015-06-27 ENCOUNTER — Ambulatory Visit (HOSPITAL_BASED_OUTPATIENT_CLINIC_OR_DEPARTMENT_OTHER): Payer: BLUE CROSS/BLUE SHIELD

## 2015-06-27 NOTE — Telephone Encounter (Signed)
Patient informed of information.  Stated verbal understanding. Due to the severity and the patient having difficulty sleeping/breathing I have scheduled his bipap titration for tonight.  Patient understands and agrees with treatment plan. He was also thankful for getting in tonight with the sleep lab

## 2015-06-27 NOTE — Addendum Note (Signed)
Addended by: Arcola Jansky on: 06/27/2015 10:58 AM   Modules accepted: Orders

## 2015-07-02 ENCOUNTER — Ambulatory Visit (HOSPITAL_BASED_OUTPATIENT_CLINIC_OR_DEPARTMENT_OTHER): Payer: BLUE CROSS/BLUE SHIELD

## 2015-07-14 ENCOUNTER — Other Ambulatory Visit: Payer: Self-pay | Admitting: *Deleted

## 2015-07-14 DIAGNOSIS — N185 Chronic kidney disease, stage 5: Secondary | ICD-10-CM

## 2015-07-14 DIAGNOSIS — Z01810 Encounter for preprocedural cardiovascular examination: Secondary | ICD-10-CM

## 2015-07-20 ENCOUNTER — Other Ambulatory Visit: Payer: Self-pay | Admitting: Physician Assistant

## 2015-07-28 ENCOUNTER — Encounter: Payer: Self-pay | Admitting: Vascular Surgery

## 2015-08-01 ENCOUNTER — Ambulatory Visit (HOSPITAL_COMMUNITY)
Admission: RE | Admit: 2015-08-01 | Discharge: 2015-08-01 | Disposition: A | Discharge status: Home / Self Care | Payer: BLUE CROSS/BLUE SHIELD | Source: Ambulatory Visit | Attending: Vascular Surgery | Admitting: Vascular Surgery

## 2015-08-01 ENCOUNTER — Encounter: Payer: Self-pay | Admitting: Vascular Surgery

## 2015-08-01 ENCOUNTER — Encounter: Payer: BLUE CROSS/BLUE SHIELD | Admitting: Vascular Surgery

## 2015-08-01 ENCOUNTER — Ambulatory Visit (INDEPENDENT_AMBULATORY_CARE_PROVIDER_SITE_OTHER)
Admission: RE | Admit: 2015-08-01 | Discharge: 2015-08-01 | Disposition: A | Discharge status: Home / Self Care | Payer: BLUE CROSS/BLUE SHIELD | Source: Ambulatory Visit | Attending: Vascular Surgery | Admitting: Vascular Surgery

## 2015-08-01 DIAGNOSIS — Z01810 Encounter for preprocedural cardiovascular examination: Secondary | ICD-10-CM | POA: Diagnosis not present

## 2015-08-01 DIAGNOSIS — N185 Chronic kidney disease, stage 5: Secondary | ICD-10-CM | POA: Diagnosis not present

## 2015-08-15 NOTE — Progress Notes (Signed)
Subjective:     Patient ID: Charles Montgomery, male   DOB: [birth year], 50 y.o.   MRN: [id number]  HPI  Patient left without being seen.   Review of Systems     Objective:   Physical Exam     Assessment:        Plan:

## 2015-08-25 ENCOUNTER — Ambulatory Visit: Payer: BLUE CROSS/BLUE SHIELD | Admitting: Cardiology

## 2015-09-04 ENCOUNTER — Ambulatory Visit: Payer: BLUE CROSS/BLUE SHIELD | Admitting: Podiatry

## 2015-09-06 ENCOUNTER — Encounter: Payer: Self-pay | Admitting: Vascular Surgery

## 2015-09-12 ENCOUNTER — Other Ambulatory Visit: Payer: Self-pay

## 2015-09-12 ENCOUNTER — Ambulatory Visit (INDEPENDENT_AMBULATORY_CARE_PROVIDER_SITE_OTHER): Payer: BLUE CROSS/BLUE SHIELD | Admitting: Vascular Surgery

## 2015-09-12 ENCOUNTER — Encounter: Payer: Self-pay | Admitting: Vascular Surgery

## 2015-09-12 VITALS — BP 138/96 | HR 103 | Ht 70.0 in | Wt 322.1 lb

## 2015-09-12 DIAGNOSIS — N184 Chronic kidney disease, stage 4 (severe): Secondary | ICD-10-CM | POA: Diagnosis not present

## 2015-09-12 NOTE — Progress Notes (Signed)
Subjective:     Patient ID: Charles Montgomery, male   DOB: 1965/07/26, 50 y.o.   MRN: [id number]  HPI  This 50 year old male with chronic kidney disease stage IV is evaluated for vascular access. He is right-handed. He does take Eliquis. He has not been on hemodialysis in the past.  Past Medical History  Diagnosis Date  . Diabetes mellitus   . Hypertension   . High cholesterol   . GERD (gastroesophageal reflux disease)   . Hypothyroid   . Obesity   . Diabetic neuropathy (HCC)   . Hyperlipidemia   . Diabetic retinopathy of left eye (HCC)   . Systolic heart failure (HCC)   . CKD (chronic kidney disease)   . Leg DVT (deep venous thromboembolism), chronic (HCC)   . Nephrotic syndrome   . Fatigue   . Snores   . Cigarette nicotine dependence, uncomplicated   . 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 DCM (dilated cardiomyopathy) (HCC) 04/12/2015  . OSA (obstructive sleep apnea) 06/26/2015    Severe with AHI 118/hr now on CPAP at 20cm H2O    Social History  Substance Use Topics  . Smoking status: Former Smoker -- 0.25 packs/day for 30 years    Types: Cigarettes    Quit date: 05/09/2015  . Smokeless tobacco: Never Used  . Alcohol Use: No     Comment: rarely    Family History  Problem Relation Age of Onset  . Leukemia Mother   . Hypertension Father   . Diabetes type II Father   . Deep vein thrombosis Father   . Renal Disease Father     renal transplant  . Healthy Sister   . Healthy Brother     No Known Allergies   Current outpatient prescriptions:  .  apixaban (ELIQUIS) 2.5 MG TABS tablet, Take 2.5 mg by mouth 2 (two) times daily., Disp: , Rfl:  .  Blood Glucose Calibration (CLEVER CHOICE GLUCOSE CONTROL) HIGH LIQD, by In Vitro route., Disp: , Rfl:  .  carvedilol (COREG) 25 MG tablet, Take 1 tablet (25 mg total) by mouth 2 (two) times daily with a meal., Disp: 60 tablet, Rfl: 11 .  Dulaglutide (TRULICITY) 1.5 MG/0.5ML SOPN,  Inject 1.5 mg into the skin once a week. , Disp: , Rfl:  .  fenofibrate (TRICOR) 48 MG tablet, Take 48 mg by mouth 2 (two) times daily. Reported on 05/08/2015, Disp: , Rfl:  .  furosemide (LASIX) 40 MG tablet, Take 1 tablet (40 mg total) by mouth 2 (two) times daily., Disp: 60 tablet, Rfl: 0 .  HUMALOG KWIKPEN 200 UNIT/ML SOPN, Inject into the skin 3 (three) times daily as needed. Based on sliding scale: If BG is greater than 200, inject 20 units into the skin once If BG is less than 200, DO NOT use any, Disp: , Rfl:  .  hydrALAZINE (APRESOLINE) 50 MG tablet, Take 1 tablet (50 mg total) by mouth 3 (three) times daily., Disp: 90 tablet, Rfl: 6 .  insulin detemir (LEVEMIR) 100 UNIT/ML injection, Inject 50 Units into the skin 2 (two) times daily. , Disp: , Rfl:  .  KLOR-CON M20 20 MEQ tablet, Take 20 mEq by mouth daily. Reported on 08/01/2015, Disp: , Rfl:  .  amoxicillin (AMOXIL) 250 MG capsule, Take 1 capsule (250 mg total) by mouth 3 (three) times daily. For seven days. (Patient not taking: Reported on 08/01/2015), Disp: 21 capsule, Rfl: 0 .  insulin aspart (NOVOLOG) 100 UNIT/ML injection, Inject  30 Units into the skin 3 (three) times daily as needed for high blood sugar. Reported on 09/12/2015, Disp: , Rfl:   Filed Vitals:   09/12/15 1422 09/12/15 1424  BP: 147/97 138/96  Pulse: 103   Height:  (1.778 m)   Weight: 322 lb 1.6 oz (146.104 kg)   SpO2: 98%     Body mass index is 46.22 kg/(m^2).           Review of Systems  Denies chest pain. Does have history of morbid obesity. Has sleep apnea.  Has cardiomyopathy. Has nephrotic syndrome. See history of present illness. Other systems negative and complete review of systems    Objective:   Physical Exam BP 138/96 mmHg  Pulse 103  Ht  (1.778 m)  Wt 322 lb 1.6 oz (146.104 kg)  BMI 46.22 kg/m2  SpO2 98%    Gen.-alert and oriented x3 in no apparent distress -morbidly obese HEENT normal for age Lungs no rhonchi or  wheezing Cardiovascular regular rhythm no murmurs carotid pulses 3+ palpable no bruits audible Abdomen soft nontender no palpable masses -morbidly obese Musculoskeletal free of  major deformities Skin clear -no rashes Neurologic normal Lower extremities 3+ femoral and dorsalis pedis pulses palpable bilaterally with no edema   today I ordered vein mapping of the left upper extremity and arterial study. He does have triphasic flow in the radial and ulnar arteries bilaterally. He has an adequate size cephalic vein on the left from the wrist to the shoulder level with similar findings on the right side       Assessment:       Chronic kidney disease stage IV-needs vascular access Morbid obesity Plan:      plan left radial-cephalic AV fistula on Friday, May 5 as outpatient  Hopefully this will result in satisfactory site for vascular access Graciela Husbands he will stop anticoagulation 3 days prior to surgery

## 2015-09-18 ENCOUNTER — Encounter (INDEPENDENT_AMBULATORY_CARE_PROVIDER_SITE_OTHER): Payer: BLUE CROSS/BLUE SHIELD | Admitting: Ophthalmology

## 2015-09-18 DIAGNOSIS — H43813 Vitreous degeneration, bilateral: Secondary | ICD-10-CM | POA: Diagnosis not present

## 2015-09-18 DIAGNOSIS — I10 Essential (primary) hypertension: Secondary | ICD-10-CM

## 2015-09-18 DIAGNOSIS — E103413 Type 1 diabetes mellitus with severe nonproliferative diabetic retinopathy with macular edema, bilateral: Secondary | ICD-10-CM

## 2015-09-18 DIAGNOSIS — H35033 Hypertensive retinopathy, bilateral: Secondary | ICD-10-CM

## 2015-09-18 DIAGNOSIS — H2513 Age-related nuclear cataract, bilateral: Secondary | ICD-10-CM

## 2015-09-18 DIAGNOSIS — E10311 Type 1 diabetes mellitus with unspecified diabetic retinopathy with macular edema: Secondary | ICD-10-CM | POA: Diagnosis not present

## 2015-09-20 ENCOUNTER — Encounter (HOSPITAL_COMMUNITY): Payer: Self-pay | Admitting: *Deleted

## 2015-09-20 NOTE — Progress Notes (Addendum)
Pt has hx of CHF, sees Dr. Armanda Magic as cardiologist. Pt denies any recent chest pain. Does have sob with exertion at times. Pt was supposed to stop his Eliquis 3 days prior to surgery. He states his last dose was 09/15/15.  Pt is diabetic. States last A1C was 9.0 about 2-3 months ago. Instructed pt to take 1/2 of usual dose of Levemir Thursday night. Pt states he won't need to take any the morning of surgery since he won't be eating (and that is ususally what [facility] tells their patients).

## 2015-09-22 ENCOUNTER — Ambulatory Visit (HOSPITAL_COMMUNITY)
Admission: RE | Admit: 2015-09-22 | Discharge: 2015-09-22 | Disposition: A | Discharge status: Home / Self Care | Payer: BLUE CROSS/BLUE SHIELD | Source: Ambulatory Visit | Attending: Vascular Surgery | Admitting: Vascular Surgery

## 2015-09-22 ENCOUNTER — Encounter (HOSPITAL_COMMUNITY)
Admission: RE | Disposition: A | Discharge status: Home / Self Care | Payer: Self-pay | Source: Ambulatory Visit | Attending: Vascular Surgery

## 2015-09-22 ENCOUNTER — Other Ambulatory Visit: Payer: Self-pay | Admitting: *Deleted

## 2015-09-22 ENCOUNTER — Encounter (HOSPITAL_COMMUNITY): Payer: Self-pay | Admitting: *Deleted

## 2015-09-22 ENCOUNTER — Ambulatory Visit (HOSPITAL_COMMUNITY): Payer: BLUE CROSS/BLUE SHIELD | Admitting: Anesthesiology

## 2015-09-22 ENCOUNTER — Ambulatory Visit (HOSPITAL_BASED_OUTPATIENT_CLINIC_OR_DEPARTMENT_OTHER): Payer: BLUE CROSS/BLUE SHIELD | Admitting: Cardiology

## 2015-09-22 VITALS — Ht 70.0 in | Wt 328.0 lb

## 2015-09-22 DIAGNOSIS — N184 Chronic kidney disease, stage 4 (severe): Secondary | ICD-10-CM | POA: Diagnosis not present

## 2015-09-22 DIAGNOSIS — Z87891 Personal history of nicotine dependence: Secondary | ICD-10-CM | POA: Diagnosis not present

## 2015-09-22 DIAGNOSIS — Z79899 Other long term (current) drug therapy: Secondary | ICD-10-CM | POA: Insufficient documentation

## 2015-09-22 DIAGNOSIS — I130 Hypertensive heart and chronic kidney disease with heart failure and stage 1 through stage 4 chronic kidney disease, or unspecified chronic kidney disease: Secondary | ICD-10-CM | POA: Insufficient documentation

## 2015-09-22 DIAGNOSIS — G4730 Sleep apnea, unspecified: Secondary | ICD-10-CM

## 2015-09-22 DIAGNOSIS — Z6841 Body Mass Index (BMI) 40.0 and over, adult: Secondary | ICD-10-CM | POA: Insufficient documentation

## 2015-09-22 DIAGNOSIS — Z4931 Encounter for adequacy testing for hemodialysis: Secondary | ICD-10-CM

## 2015-09-22 DIAGNOSIS — Z794 Long term (current) use of insulin: Secondary | ICD-10-CM | POA: Diagnosis not present

## 2015-09-22 DIAGNOSIS — E1122 Type 2 diabetes mellitus with diabetic chronic kidney disease: Secondary | ICD-10-CM | POA: Diagnosis not present

## 2015-09-22 DIAGNOSIS — N186 End stage renal disease: Secondary | ICD-10-CM

## 2015-09-22 DIAGNOSIS — G4733 Obstructive sleep apnea (adult) (pediatric): Secondary | ICD-10-CM | POA: Diagnosis not present

## 2015-09-22 DIAGNOSIS — I5022 Chronic systolic (congestive) heart failure: Secondary | ICD-10-CM | POA: Diagnosis not present

## 2015-09-22 DIAGNOSIS — E039 Hypothyroidism, unspecified: Secondary | ICD-10-CM | POA: Diagnosis not present

## 2015-09-22 DIAGNOSIS — I129 Hypertensive chronic kidney disease with stage 1 through stage 4 chronic kidney disease, or unspecified chronic kidney disease: Secondary | ICD-10-CM | POA: Diagnosis present

## 2015-09-22 HISTORY — DX: Major depressive disorder, single episode, unspecified: F32.9

## 2015-09-22 HISTORY — PX: AV FISTULA PLACEMENT: SHX1204

## 2015-09-22 HISTORY — DX: Polyneuropathy, unspecified: G62.9

## 2015-09-22 HISTORY — DX: Retinal edema: H35.81

## 2015-09-22 HISTORY — DX: Depression, unspecified: F32.A

## 2015-09-22 HISTORY — PX: BIPAP TITRATION: SLE1003

## 2015-09-22 LAB — POCT I-STAT 4, (NA,K, GLUC, HGB,HCT)
Glucose, Bld: 148 mg/dL — ABNORMAL HIGH (ref 65–99)
HCT: 37 % — ABNORMAL LOW (ref 39.0–52.0)
Hemoglobin: 12.6 g/dL — ABNORMAL LOW (ref 13.0–17.0)
Potassium: 3.9 mmol/L (ref 3.5–5.1)
Sodium: 140 mmol/L (ref 135–145)

## 2015-09-22 LAB — GLUCOSE, CAPILLARY
GLUCOSE-CAPILLARY: 149 mg/dL — AB (ref 65–99)
GLUCOSE-CAPILLARY: 139 mg/dL — AB (ref 65–99)
Glucose-Capillary: 154 mg/dL — ABNORMAL HIGH (ref 65–99)

## 2015-09-22 SURGERY — ARTERIOVENOUS (AV) FISTULA CREATION
Anesthesia: Monitor Anesthesia Care | Site: Arm Lower | Laterality: Left

## 2015-09-22 MED ORDER — ROCURONIUM BROMIDE 50 MG/5ML IV SOLN
INTRAVENOUS | Status: AC
  Filled 2015-09-22: qty 1

## 2015-09-22 MED ORDER — LIDOCAINE 2% (20 MG/ML) 5 ML SYRINGE
INTRAMUSCULAR | Status: AC
  Filled 2015-09-22: qty 5

## 2015-09-22 MED ORDER — MIDAZOLAM HCL 2 MG/2ML IJ SOLN
INTRAMUSCULAR | Status: AC
  Filled 2015-09-22: qty 2

## 2015-09-22 MED ORDER — FENTANYL CITRATE (PF) 250 MCG/5ML IJ SOLN
INTRAMUSCULAR | Status: AC
  Filled 2015-09-22: qty 5

## 2015-09-22 MED ORDER — DEXTROSE 5 % IV SOLN
1.5000 g | INTRAVENOUS | Status: AC
  Administered 2015-09-22: 1.5 g via INTRAVENOUS
  Filled 2015-09-22: qty 1.5

## 2015-09-22 MED ORDER — HEPARIN SODIUM (PORCINE) 1000 UNIT/ML IJ SOLN
INTRAMUSCULAR | Status: AC
  Filled 2015-09-22: qty 1

## 2015-09-22 MED ORDER — PROPOFOL 500 MG/50ML IV EMUL
INTRAVENOUS | Status: DC | PRN
  Administered 2015-09-22: 75 ug/kg/min via INTRAVENOUS

## 2015-09-22 MED ORDER — LIDOCAINE-EPINEPHRINE (PF) 1 %-1:200000 IJ SOLN
INTRAMUSCULAR | Status: DC | PRN
  Administered 2015-09-22: 30 mL

## 2015-09-22 MED ORDER — SODIUM CHLORIDE 0.9 % IV SOLN
INTRAVENOUS | Status: DC | PRN
  Administered 2015-09-22: 10:00:00

## 2015-09-22 MED ORDER — PROPOFOL 10 MG/ML IV BOLUS
INTRAVENOUS | Status: AC
  Filled 2015-09-22: qty 20

## 2015-09-22 MED ORDER — PHENYLEPHRINE HCL 10 MG/ML IJ SOLN
INTRAMUSCULAR | Status: DC | PRN
  Administered 2015-09-22 (×2): 80 ug via INTRAVENOUS

## 2015-09-22 MED ORDER — CHLORHEXIDINE GLUCONATE CLOTH 2 % EX PADS: 6.0000 | MEDICATED_PAD | Freq: Once | CUTANEOUS | Status: DC

## 2015-09-22 MED ORDER — ONDANSETRON HCL 4 MG/2ML IJ SOLN
INTRAMUSCULAR | Status: DC | PRN
  Administered 2015-09-22: 4 mg via INTRAVENOUS

## 2015-09-22 MED ORDER — ONDANSETRON HCL 4 MG/2ML IJ SOLN
INTRAMUSCULAR | Status: AC
  Filled 2015-09-22: qty 2

## 2015-09-22 MED ORDER — GLYCOPYRROLATE 0.2 MG/ML IJ SOLN
INTRAMUSCULAR | Status: DC | PRN
  Administered 2015-09-22: 0.2 mg via INTRAVENOUS

## 2015-09-22 MED ORDER — LIDOCAINE-EPINEPHRINE (PF) 1 %-1:200000 IJ SOLN
INTRAMUSCULAR | Status: AC
  Filled 2015-09-22: qty 30

## 2015-09-22 MED ORDER — SODIUM CHLORIDE 0.9 % IV SOLN
INTRAVENOUS | Status: DC
  Administered 2015-09-22: 20 mL/h via INTRAVENOUS
  Administered 2015-09-22: 11:00:00 via INTRAVENOUS

## 2015-09-22 MED ORDER — MIDAZOLAM HCL 5 MG/5ML IJ SOLN
INTRAMUSCULAR | Status: DC | PRN
  Administered 2015-09-22: 4 mg via INTRAVENOUS

## 2015-09-22 MED ORDER — FENTANYL CITRATE (PF) 100 MCG/2ML IJ SOLN
INTRAMUSCULAR | Status: DC | PRN
  Administered 2015-09-22: 25 ug via INTRAVENOUS

## 2015-09-22 MED ORDER — OXYCODONE-ACETAMINOPHEN 5-325 MG PO TABS: 1.0000 | ORAL_TABLET | Freq: Four times a day (QID) | ORAL | Status: DC | PRN

## 2015-09-22 MED ORDER — 0.9 % SODIUM CHLORIDE (POUR BTL) OPTIME
TOPICAL | Status: DC | PRN
  Administered 2015-09-22: 1000 mL

## 2015-09-22 SURGICAL SUPPLY — 33 items
ARMBAND PINK RESTRICT EXTREMIT (MISCELLANEOUS) ×3 IMPLANT
CANISTER SUCTION 2500CC (MISCELLANEOUS) ×3 IMPLANT
CATH EMB 3FR 80CM (CATHETERS) ×3 IMPLANT
CLIP TI MEDIUM 6 (CLIP) ×3 IMPLANT
CLIP TI WIDE RED SMALL 6 (CLIP) ×6 IMPLANT
DRAIN PENROSE 1/2X12 LTX STRL (WOUND CARE) ×3 IMPLANT
ELECT REM PT RETURN 9FT ADLT (ELECTROSURGICAL) ×3
ELECTRODE REM PT RTRN 9FT ADLT (ELECTROSURGICAL) ×1 IMPLANT
GLOVE BIOGEL PI IND STRL 7.0 (GLOVE) ×1 IMPLANT
GLOVE BIOGEL PI IND STRL 7.5 (GLOVE) ×1 IMPLANT
GLOVE BIOGEL PI IND STRL 8 (GLOVE) ×1 IMPLANT
GLOVE BIOGEL PI INDICATOR 7.0 (GLOVE) ×2
GLOVE BIOGEL PI INDICATOR 7.5 (GLOVE) ×2
GLOVE BIOGEL PI INDICATOR 8 (GLOVE) ×2
GLOVE ECLIPSE 7.0 STRL STRAW (GLOVE) ×3 IMPLANT
GLOVE ECLIPSE 7.5 STRL STRAW (GLOVE) ×3 IMPLANT
GLOVE SS BIOGEL STRL SZ 7 (GLOVE) ×1 IMPLANT
GLOVE SUPERSENSE BIOGEL SZ 7 (GLOVE) ×2
GOWN STRL REUS W/ TWL LRG LVL3 (GOWN DISPOSABLE) ×2 IMPLANT
GOWN STRL REUS W/TWL LRG LVL3 (GOWN DISPOSABLE) ×4
GOWN STRL REUS W/TWL XL LVL3 (GOWN DISPOSABLE) ×3 IMPLANT
KIT BASIN OR (CUSTOM PROCEDURE TRAY) ×3 IMPLANT
KIT ROOM TURNOVER OR (KITS) ×3 IMPLANT
LIQUID BAND (GAUZE/BANDAGES/DRESSINGS) ×3 IMPLANT
NS IRRIG 1000ML POUR BTL (IV SOLUTION) ×3 IMPLANT
PACK CV ACCESS (CUSTOM PROCEDURE TRAY) ×3 IMPLANT
PAD ARMBOARD 7.5X6 YLW CONV (MISCELLANEOUS) ×6 IMPLANT
SUT PROLENE 6 0 BV (SUTURE) ×3 IMPLANT
SUT VIC AB 3-0 SH 27 (SUTURE) ×2
SUT VIC AB 3-0 SH 27X BRD (SUTURE) ×1 IMPLANT
SYR TB 1ML LUER SLIP (SYRINGE) ×3 IMPLANT
UNDERPAD 30X30 INCONTINENT (UNDERPADS AND DIAPERS) ×3 IMPLANT
WATER STERILE IRR 1000ML POUR (IV SOLUTION) ×3 IMPLANT

## 2015-09-22 NOTE — Anesthesia Procedure Notes (Signed)
Procedure Name: MAC Date/Time: 09/22/2015 10:25 AM Performed by: Lovie Chol Pre-anesthesia Checklist: Patient identified, Emergency Drugs available, Patient being monitored, Suction available and Timeout performed Patient Re-evaluated:Patient Re-evaluated prior to inductionOxygen Delivery Method: Simple face mask

## 2015-09-22 NOTE — H&P (View-Only) (Signed)
Subjective:     Patient ID: Charles Montgomery, male   DOB: 1965/07/26, 50 y.o.   MRN: [id number]  HPI  This 50 year old male with chronic kidney disease stage IV is evaluated for vascular access. He is right-handed. He does take Eliquis. He has not been on hemodialysis in the past.  Past Medical History  Diagnosis Date  . Diabetes mellitus   . Hypertension   . High cholesterol   . GERD (gastroesophageal reflux disease)   . Hypothyroid   . Obesity   . Diabetic neuropathy (HCC)   . Hyperlipidemia   . Diabetic retinopathy of left eye (HCC)   . Systolic heart failure (HCC)   . CKD (chronic kidney disease)   . Leg DVT (deep venous thromboembolism), chronic (HCC)   . Nephrotic syndrome   . Fatigue   . Snores   . Cigarette nicotine dependence, uncomplicated   . 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 DCM (dilated cardiomyopathy) (HCC) 04/12/2015  . OSA (obstructive sleep apnea) 06/26/2015    Severe with AHI 118/hr now on CPAP at 20cm H2O    Social History  Substance Use Topics  . Smoking status: Former Smoker -- 0.25 packs/day for 30 years    Types: Cigarettes    Quit date: 05/09/2015  . Smokeless tobacco: Never Used  . Alcohol Use: No     Comment: rarely    Family History  Problem Relation Age of Onset  . Leukemia Mother   . Hypertension Father   . Diabetes type II Father   . Deep vein thrombosis Father   . Renal Disease Father     renal transplant  . Healthy Sister   . Healthy Brother     No Known Allergies   Current outpatient prescriptions:  .  apixaban (ELIQUIS) 2.5 MG TABS tablet, Take 2.5 mg by mouth 2 (two) times daily., Disp: , Rfl:  .  Blood Glucose Calibration (CLEVER CHOICE GLUCOSE CONTROL) HIGH LIQD, by In Vitro route., Disp: , Rfl:  .  carvedilol (COREG) 25 MG tablet, Take 1 tablet (25 mg total) by mouth 2 (two) times daily with a meal., Disp: 60 tablet, Rfl: 11 .  Dulaglutide (TRULICITY) 1.5 MG/0.5ML SOPN,  Inject 1.5 mg into the skin once a week. , Disp: , Rfl:  .  fenofibrate (TRICOR) 48 MG tablet, Take 48 mg by mouth 2 (two) times daily. Reported on 05/08/2015, Disp: , Rfl:  .  furosemide (LASIX) 40 MG tablet, Take 1 tablet (40 mg total) by mouth 2 (two) times daily., Disp: 60 tablet, Rfl: 0 .  HUMALOG KWIKPEN 200 UNIT/ML SOPN, Inject into the skin 3 (three) times daily as needed. Based on sliding scale: If BG is greater than 200, inject 20 units into the skin once If BG is less than 200, DO NOT use any, Disp: , Rfl:  .  hydrALAZINE (APRESOLINE) 50 MG tablet, Take 1 tablet (50 mg total) by mouth 3 (three) times daily., Disp: 90 tablet, Rfl: 6 .  insulin detemir (LEVEMIR) 100 UNIT/ML injection, Inject 50 Units into the skin 2 (two) times daily. , Disp: , Rfl:  .  KLOR-CON M20 20 MEQ tablet, Take 20 mEq by mouth daily. Reported on 08/01/2015, Disp: , Rfl:  .  amoxicillin (AMOXIL) 250 MG capsule, Take 1 capsule (250 mg total) by mouth 3 (three) times daily. For seven days. (Patient not taking: Reported on 08/01/2015), Disp: 21 capsule, Rfl: 0 .  insulin aspart (NOVOLOG) 100 UNIT/ML injection, Inject  30 Units into the skin 3 (three) times daily as needed for high blood sugar. Reported on 09/12/2015, Disp: , Rfl:   Filed Vitals:   09/12/15 1422 09/12/15 1424  BP: 147/97 138/96  Pulse: 103   Height: 5' 10" (1.778 m)   Weight: 322 lb 1.6 oz (146.104 kg)   SpO2: 98%     Body mass index is 46.22 kg/(m^2).           Review of Systems  Denies chest pain. Does have history of morbid obesity. Has sleep apnea.  Has cardiomyopathy. Has nephrotic syndrome. See history of present illness. Other systems negative and complete review of systems    Objective:   Physical Exam BP 138/96 mmHg  Pulse 103  Ht 5' 10" (1.778 m)  Wt 322 lb 1.6 oz (146.104 kg)  BMI 46.22 kg/m2  SpO2 98%    Gen.-alert and oriented x3 in no apparent distress -morbidly obese HEENT normal for age Lungs no rhonchi or  wheezing Cardiovascular regular rhythm no murmurs carotid pulses 3+ palpable no bruits audible Abdomen soft nontender no palpable masses -morbidly obese Musculoskeletal free of  major deformities Skin clear -no rashes Neurologic normal Lower extremities 3+ femoral and dorsalis pedis pulses palpable bilaterally with no edema   today I ordered vein mapping of the left upper extremity and arterial study. He does have triphasic flow in the radial and ulnar arteries bilaterally. He has an adequate size cephalic vein on the left from the wrist to the shoulder level with similar findings on the right side       Assessment:       Chronic kidney disease stage IV-needs vascular access Morbid obesity Plan:      plan left radial-cephalic AV fistula on Friday, May 5 as outpatient  Hopefully this will result in satisfactory site for vascular access Klein he will stop anticoagulation 3 days prior to surgery       

## 2015-09-22 NOTE — Transfer of Care (Signed)
Immediate Anesthesia Transfer of Care Note  Patient: Charles Montgomery  Procedure(s) Performed: Procedure(s): RADIOCEPHALIC ARTERIOVENOUS (AV) FISTULA CREATION (Left)  Patient Location: PACU  Anesthesia Type:MAC  Level of Consciousness: awake, alert , oriented and patient cooperative  Airway & Oxygen Therapy: Patient Spontanous Breathing and Patient connected to nasal cannula oxygen  Post-op Assessment: Report given to RN and Post -op Vital signs reviewed and stable  Post vital signs: Reviewed  Last Vitals:  Filed Vitals:   09/22/15 0808  BP: 118/79  Pulse: 104  Temp: 36.7 C  Resp: 18    Last Pain: There were no vitals filed for this visit.    Patients Stated Pain Goal: 3 (09/22/15 6606)  Complications: No apparent anesthesia complications

## 2015-09-22 NOTE — Anesthesia Postprocedure Evaluation (Signed)
Anesthesia Post Note  Patient: Charles Montgomery  Procedure(s) Performed: Procedure(s) (LRB): RADIOCEPHALIC ARTERIOVENOUS (AV) FISTULA CREATION (Left)  Patient location during evaluation: PACU Anesthesia Type: MAC Level of consciousness: awake and alert Pain management: pain level controlled Vital Signs Assessment: post-procedure vital signs reviewed and stable Respiratory status: spontaneous breathing, nonlabored ventilation, respiratory function stable and patient connected to nasal cannula oxygen Cardiovascular status: stable and blood pressure returned to baseline Anesthetic complications: no    Last Vitals:  Filed Vitals:   09/22/15 1146 09/22/15 1159  BP:  109/75  Pulse: 109 110  Temp:    Resp: 22     Last Pain: There were no vitals filed for this visit.               Rakwon Letourneau DAVID

## 2015-09-22 NOTE — Op Note (Signed)
OPERATIVE REPORT  Date of Surgery: 09/22/2015  Surgeon: Josephina Gip, MD  Assistant: Lianne Cure PA  Pre-op Diagnosis: Stage IV Chronic Kidney Disease N18.4  Post-op Diagnosis: Stage IV Chronic Kidney Disease N18.4  Procedure: Procedure(s): RADIOCEPHALIC ARTERIOVENOUS (AV) FISTULA CREATION-left upper extremity  Anesthesia: Mac  EBL: Minimal  Complications: None  Procedure Details: The patient was taken the operative placed in supine position at which time left upper extremity was imaged using B-mode ultrasound. The cephalic vein appeared borderline but probably adequate for fistula creation in the forearm and upper arm. Infiltration forms and Xylocaine and prepping and draping in routine sterile manner short longitudinal incision was made proximal the wrist between the radial artery and cephalic vein. Cephalic vein was dissected free branches ligated with 3 and 4-0 silk ties and divided. It was transected after ligating it distally gently dilated with heparinized saline. It was 3 mm in size. A Fogarty catheter would easily pass up the vein into the upper arm and upon return no areas of narrowing were noted. The radial artery was exposed beneath fascia encircled with Vesseloops. It was a 3 mm vessel with good pulse. It was occluded proximally and distally with small vascular bulldog clamps open 15 blade extended with Potts scissors. It would accept a 3 mm dilator. Vein was carefully spatulated and anastomosed end to side with 6-0 Prolene. Clamps then released there was an excellent pulse and palpable thrill at the anastomotic area. Blood pressure was in the 80 systolic range at this time because of the patient's severe sleep apnea and needing to sedate him somewhat. He did have audible Doppler flow up into the proximal upper arm and the radial flow distally did improve with compression of the fistula. Adequate hemostasis was achieved wound closed in layers with Vicryl subcuticular fashion with  Dermabond patient taken to recovery room in satisfactory condition   Josephina Gip, MD 09/22/2015 11:21 AM

## 2015-09-22 NOTE — OR Nursing (Signed)
Dr. Hart Rochester informed of fistula assessment, no bruit or thrill. MD informs that this is acceptable and not unexpected. No new orders.

## 2015-09-22 NOTE — Interval H&P Note (Signed)
History and Physical Interval Note:  09/22/2015 7:34 AM  Charles Montgomery  has presented today for surgery, with the diagnosis of Stage IV Chronic Kidney Disease N18.4  The various methods of treatment have been discussed with the patient and family. After consideration of risks, benefits and other options for treatment, the patient has consented to  Procedure(s): RADIOCEPHALIC ARTERIOVENOUS (AV) FISTULA CREATION (Left) as a surgical intervention .  The patient's history has been reviewed, patient examined, no change in status, stable for surgery.  I have reviewed the patient's chart and labs.  Questions were answered to the patient's satisfaction.     Josephina Gip

## 2015-09-22 NOTE — Anesthesia Preprocedure Evaluation (Signed)
Anesthesia Evaluation  Patient identified by MRN, date of birth, ID band Patient awake    Reviewed: Allergy & Precautions, NPO status , Patient's Chart, lab work & pertinent test results  Airway Mallampati: II  TM Distance: >3 FB Neck ROM: Full    Dental   Pulmonary former smoker,    Pulmonary exam normal        Cardiovascular hypertension, Pt. on medications Normal cardiovascular exam     Neuro/Psych Depression    GI/Hepatic GERD  Medicated and Controlled,  Endo/Other  diabetes, Type 2, Insulin Dependent  Renal/GU Renal InsufficiencyRenal disease     Musculoskeletal   Abdominal   Peds  Hematology   Anesthesia Other Findings   Reproductive/Obstetrics                             Anesthesia Physical Anesthesia Plan  ASA: III  Anesthesia Plan: MAC   Post-op Pain Management:    Induction: Intravenous  Airway Management Planned: Natural Airway  Additional Equipment:   Intra-op Plan:   Post-operative Plan:   Informed Consent: I have reviewed the patients History and Physical, chart, labs and discussed the procedure including the risks, benefits and alternatives for the proposed anesthesia with the patient or authorized representative who has indicated his/her understanding and acceptance.     Plan Discussed with: CRNA and Surgeon  Anesthesia Plan Comments:         Anesthesia Quick Evaluation

## 2015-09-23 LAB — HEMOGLOBIN A1C
Hgb A1c MFr Bld: 9.4 % — ABNORMAL HIGH (ref 4.8–5.6)
Mean Plasma Glucose: 223 mg/dL

## 2015-09-25 ENCOUNTER — Ambulatory Visit (INDEPENDENT_AMBULATORY_CARE_PROVIDER_SITE_OTHER): Payer: BLUE CROSS/BLUE SHIELD | Admitting: Podiatry

## 2015-09-25 ENCOUNTER — Encounter: Payer: Self-pay | Admitting: Podiatry

## 2015-09-25 ENCOUNTER — Ambulatory Visit (INDEPENDENT_AMBULATORY_CARE_PROVIDER_SITE_OTHER): Payer: BLUE CROSS/BLUE SHIELD

## 2015-09-25 DIAGNOSIS — B351 Tinea unguium: Secondary | ICD-10-CM | POA: Diagnosis not present

## 2015-09-25 DIAGNOSIS — M19079 Primary osteoarthritis, unspecified ankle and foot: Secondary | ICD-10-CM | POA: Diagnosis not present

## 2015-09-25 DIAGNOSIS — L853 Xerosis cutis: Secondary | ICD-10-CM

## 2015-09-25 DIAGNOSIS — R52 Pain, unspecified: Secondary | ICD-10-CM

## 2015-09-25 DIAGNOSIS — M79676 Pain in unspecified toe(s): Secondary | ICD-10-CM

## 2015-09-25 MED ORDER — CICLOPIROX 8 % EX SOLN: Freq: Every day | CUTANEOUS | Status: DC

## 2015-09-25 MED ORDER — UREA 40 % EX OINT: TOPICAL_OINTMENT | CUTANEOUS | Status: DC | PRN

## 2015-09-25 NOTE — Progress Notes (Signed)
   Subjective:    Patient ID: Charles Montgomery, male    DOB: 05/08/1966, 50 y.o.   MRN: [id number]  HPI  50 year old male presents the office today for diabetic risk assessment. He states his nails are thick elongated and he cannot trim them himself and they're painful with shoe gear and pressure.As he gets pain to both of his feet at times he feels that sometimes he has been a fall due to numbness in his feet. This has been ongoing for quite some time. He denies any recent injury or trauma. No swelling or redness. No tingling or burning pain. Denies any claudication symptoms. The last A1c was 9 and his blood sugar yesterday was 124. No other complaints.  Review of Systems  All other systems reviewed and are negative.      Objective:   Physical Exam General: AAO x3, NAD  Dermatological: Nails are hypertrophic, dystrophic, brittle, discolored, elongated 10. No surrounding redness or drainage. Tenderness nails 1-5 bilaterally. No open lesions are present. There is dry xerotic skin.  Vascular: Dorsalis Pedis artery and Posterior Tibial artery pedal pulses are 2/4 bilateral with immedate capillary fill time. Pedal hair growth present. No varicosities and no lower extremity edema present bilateral. There is no pain with calf compression, swelling, warmth, erythema.   Neruologic: Sensation decreased with Sims once the monofilament.   Musculoskeletal: Flatfoot is present. Hammertoe contractures mildly. Mild HAV. No area pinpoint any tenderness is no pain vibratory sensation. No pain, crepitus, or limitation noted with foot and ankle range of motion bilateral. Muscular strength 5/5 in all groups tested bilateral.  Gait: Unassisted, Nonantalgic.      Assessment & Plan:  Patient presents for diabetic risk assessment, symptomatic onychomycosis, xerotic skin, foot pain likely result of possible neuropathy and osteoarthritis -Treatment options discussed including all alternatives, risks, and  complications -Etiology of symptoms were discussed -X-rays were obtained and reviewed with the patient. No evidence of acute fracture or stress fracture. Mild midfoot arthritic changes are present. Mild HAV. -Nails debrided 10 without complications or bleeding. -Prescribed urea cream. -Discussed treatment options for onychomycosis and he is elected to proceed with topical treatment. Prescribed Penlac. Directed on use. -The pain result of [place] arthritis and possible neuropathy. Discussed with him neuropathy treatments. We will start with anti-inflammatory compound cream to use to his foot. This is not helped discussed possible gabapentin or other treatments.  -Follow-up as scheduled or sooner if any issues are to arise or any worsening of symptoms.  Ovid Curd, DPM

## 2015-09-25 NOTE — Patient Instructions (Signed)
Diabetic Neuropathy Diabetic neuropathy is a nerve disease or nerve damage that is caused by diabetes mellitus. About half of all people with diabetes mellitus have some form of nerve damage. Nerve damage is more common in those who have had diabetes mellitus for many years and who generally have not had good control of their blood sugar (glucose) level. Diabetic neuropathy is a common complication of diabetes mellitus. There are three common types of diabetic neuropathy and a fourth type that is less common and less understood:   Peripheral neuropathy--This is the most common type of diabetic neuropathy. It causes damage to the nerves of the feet and legs first and then eventually the hands and arms.The damage affects the ability to sense touch.  Autonomic neuropathy--This type causes damage to the autonomic nervous system, which controls the following functions:  Heartbeat.  Body temperature.  Blood pressure.  Urination.  Digestion.  Sweating.  Sexual function.  Focal neuropathy--Focal neuropathy can be painful and unpredictable and occurs most often in older adults with diabetes mellitus. It involves a specific nerve or one area and often comes on suddenly. It usually does not cause long-term problems.  Radiculoplexus neuropathy-- Sometimes called lumbosacral radiculoplexus neuropathy, radiculoplexus neuropathy affects the nerves of the thighs, hips, buttocks, or legs. It is more common in people with type 2 diabetes mellitus and in older men. It is characterized by debilitating pain, weakness, and atrophy, usually in the thigh muscles. CAUSES  The cause of peripheral, autonomic, and focal neuropathies is diabetes mellitus that is uncontrolled and high glucose levels. The cause of radiculoplexus neuropathy is unknown. However, it is thought to be caused by inflammation related to uncontrolled glucose levels. SIGNS AND SYMPTOMS  Peripheral Neuropathy Peripheral neuropathy develops  slowly over time. When the nerves of the feet and legs no longer work there may be:   Burning, stabbing, or aching pain in the legs or feet.  Inability to feel pressure or pain in your feet. This can lead to:  Thick calluses over pressure areas.  Pressure sores.  Ulcers.  Foot deformities.  Reduced ability to feel temperature changes.  Muscle weakness. Autonomic Neuropathy The symptoms of autonomic neuropathy vary depending on which nerves are affected. Symptoms may include:  Problems with digestion, such as:  Feeling sick to your stomach (nausea).  Vomiting.  Bloating.  Constipation.  Diarrhea.  Abdominal pain.  Difficulty with urination. This occurs if you lose your ability to sense when your bladder is full. Problems include:  Urine leakage (incontinence).  Inability to empty your bladder completely (retention).  Rapid or irregular heartbeat (palpitations).  Blood pressure drops when you stand up (orthostatic hypotension). When you stand up you may feel:  Dizzy.  Weak.  Faint.  In men, inability to attain and maintain an erection.  In women, vaginal dryness and problems with decreased sexual desire and arousal.  Problems with body temperature regulation.  Increased or decreased sweating. Focal Neuropathy  Abnormal eye movements or abnormal alignment of both eyes.  Weakness in the wrist.  Foot drop. This results in an inability to lift the foot properly and abnormal walking or foot movement.  Paralysis on one side of your face (Bell palsy).  Chest or abdominal pain. Radiculoplexus Neuropathy  Sudden, severe pain in your hip, thigh, or buttocks.  Weakness and wasting of thigh muscles.  Difficulty rising from a seated position.  Abdominal swelling.  Unexplained weight loss (usually more than 10 lb [4.5 kg]). DIAGNOSIS  Peripheral Neuropathy Your senses may be   tested. Sensory function testing can be done with:  A light touch using a  monofilament.  A vibration with tuning fork.  A sharp sensation with a pin prick. Other tests that can help diagnose neuropathy are:  Nerve conduction velocity. This test checks the transmission of an electrical current through a nerve.  Electromyography. This shows how muscles respond to electrical signals transmitted by nearby nerves.  Quantitative sensory testing. This is used to assess how your nerves respond to vibrations and changes in temperature. Autonomic Neuropathy Diagnosis is often based on reported symptoms. Tell your health care provider if you experience:   Dizziness.   Constipation.   Diarrhea.   Inappropriate urination or inability to urinate.   Inability to get or maintain an erection.  Tests that may be done include:   Electrocardiography or Holter monitor. These are tests that can help show problems with the heart rate or heart rhythm.   An X-ray exam may be done. Focal Neuropathy Diagnosis is made based on your symptoms and what your health care provider finds during your exam. Other tests may be done. They may include:  Nerve conduction velocities. This checks the transmission of electrical current through a nerve.  Electromyography. This shows how muscles respond to electrical signals transmitted by nearby nerves.  Quantitative sensory testing. This test is used to assess how your nerves respond to vibration and changes in temperature. Radiculoplexus Neuropathy  Often the first thing is to eliminate any other issue or problems that might be the cause, as there is no stick test for diagnosis.  X-ray exam of your spine and lumbar region.  Spinal tap to rule out cancer.  MRI to rule out other lesions. TREATMENT  Once nerve damage occurs, it cannot be reversed. The goal of treatment is to keep the disease or nerve damage from getting worse and affecting more nerve fibers. Controlling your blood glucose level is the key. Most people with  radiculoplexus neuropathy see at least a partial improvement over time. You will need to keep your blood glucose and HbA1c levels in the target range determined by your health care provider. Things that help control blood glucose levels include:   Blood glucose monitoring.   Meal planning.   Physical activity.   Diabetes medicine.  Over time, maintaining lower blood glucose levels helps lessen symptoms. Sometimes, prescription pain medicine is needed. HOME CARE INSTRUCTIONS:  Do not smoke.  Keep your blood glucose level in the range that you and your health care provider have determined acceptable for you.  Keep your blood pressure level in the range that you and your health care provider have determined acceptable for you.  Eat a well-balanced diet.  Be physically active every day. Include strength training and balance exercises.  Protect your feet.  Check your feet every day for sores, cuts, blisters, or signs of infection.  Wear padded socks and supportive shoes. Use orthotic inserts, if necessary.  Regularly check the insides of your shoes for worn spots. Make sure there are no rocks or other items inside your shoes before you put them on. SEEK MEDICAL CARE IF:   You have burning, stabbing, or aching pain in the legs or feet.  You are unable to feel pressure or pain in your feet.  You develop problems with digestion such as:  Nausea.  Vomiting.  Bloating.  Constipation.  Diarrhea.  Abdominal pain.  You have difficulty with urination, such as:  Incontinence.  Retention.  You have palpitations.  You   develop orthostatic hypotension. When you stand up you may feel:  Dizzy.  Weak.  Faint.  You cannot attain and maintain an erection (in men).  You have vaginal dryness and problems with decreased sexual desire and arousal (in women).  You have severe pain in your thighs, legs, or buttocks.  You have unexplained weight loss.   This information  is not intended to replace advice given to you by your health care provider. Make sure you discuss any questions you have with your health care provider.   Document Released: 07/15/2001 Document Revised: 05/27/2014 Document Reviewed: 10/15/2012 Elsevier Interactive Patient Education 2016 Elsevier Inc.   Diabetes and Foot Care Diabetes may cause you to have problems because of poor blood supply (circulation) to your feet and legs. This may cause the skin on your feet to become thinner, break easier, and heal more slowly. Your skin may become dry, and the skin may peel and crack. You may also have nerve damage in your legs and feet causing decreased feeling in them. You may not notice minor injuries to your feet that could lead to infections or more serious problems. Taking care of your feet is one of the most important things you can do for yourself.  HOME CARE INSTRUCTIONS  Wear shoes at all times, even in the house. Do not go barefoot. Bare feet are easily injured.  Check your feet daily for blisters, cuts, and redness. If you cannot see the bottom of your feet, use a mirror or ask someone for help.  Wash your feet with warm water (do not use hot water) and mild soap. Then pat your feet and the areas between your toes until they are completely dry. Do not soak your feet as this can dry your skin.  Apply a moisturizing lotion or petroleum jelly (that does not contain alcohol and is unscented) to the skin on your feet and to dry, brittle toenails. Do not apply lotion between your toes.  Trim your toenails straight across. Do not dig under them or around the cuticle. File the edges of your nails with an emery board or nail file.  Do not cut corns or calluses or try to remove them with medicine.  Wear clean socks or stockings every day. Make sure they are not too tight. Do not wear knee-high stockings since they may decrease blood flow to your legs.  Wear shoes that fit properly and have enough  cushioning. To break in new shoes, wear them for just a few hours a day. This prevents you from injuring your feet. Always look in your shoes before you put them on to be sure there are no objects inside.  Do not cross your legs. This may decrease the blood flow to your feet.  If you find a minor scrape, cut, or break in the skin on your feet, keep it and the skin around it clean and dry. These areas may be cleansed with mild soap and water. Do not cleanse the area with peroxide, alcohol, or iodine.  When you remove an adhesive bandage, be sure not to damage the skin around it.  If you have a wound, look at it several times a day to make sure it is healing.  Do not use heating pads or hot water bottles. They may burn your skin. If you have lost feeling in your feet or legs, you may not know it is happening until it is too late.  Make sure your health care provider performs a   complete foot exam at least annually or more often if you have foot problems. Report any cuts, sores, or bruises to your health care provider immediately. SEEK MEDICAL CARE IF:   You have an injury that is not healing.  You have cuts or breaks in the skin.  You have an ingrown nail.  You notice redness on your legs or feet.  You feel burning or tingling in your legs or feet.  You have pain or cramps in your legs and feet.  Your legs or feet are numb.  Your feet always feel cold. SEEK IMMEDIATE MEDICAL CARE IF:   There is increasing redness, swelling, or pain in or around a wound.  There is a red line that goes up your leg.  Pus is coming from a wound.  You develop a fever or as directed by your health care provider.  You notice a bad smell coming from an ulcer or wound.   This information is not intended to replace advice given to you by your health care provider. Make sure you discuss any questions you have with your health care provider.   Document Released: 05/03/2000 Document Revised: 01/06/2013  Document Reviewed: 10/13/2012 Elsevier Interactive Patient Education 2016 Elsevier Inc.  

## 2015-09-26 ENCOUNTER — Telehealth: Payer: Self-pay | Admitting: Vascular Surgery

## 2015-09-26 ENCOUNTER — Telehealth: Payer: Self-pay

## 2015-09-26 DIAGNOSIS — G8918 Other acute postprocedural pain: Secondary | ICD-10-CM

## 2015-09-26 MED ORDER — OXYCODONE-ACETAMINOPHEN 5-325 MG PO TABS: 1.0000 | ORAL_TABLET | Freq: Four times a day (QID) | ORAL | Status: DC | PRN

## 2015-09-26 NOTE — Telephone Encounter (Signed)
rec'd call from pt. With request for refill on pain medication.  Reported the pain keeps him awake at night.  Stated the pain is in the incision.  Denied any pain in hand/ fingers.  Reported the pain is constant.  Reported the incision looks good.  Denied any redness or drainage. Rated the pain at 8/10.  Advised to try an OTC analgesic.  Reported the only thing I can take OTC is Tylenol.  Stated the ES Tylenol doesn't help. Encouraged the patient that the pain should begin to improve after about the 1st 5-6 days.  Stated "I have to have something because I can't sleep at night."  Advised will discuss with Dr. Hart Rochester, and return call with recommendation.  Agreed.

## 2015-09-26 NOTE — Telephone Encounter (Signed)
sched lab 5/26 at 1 and md 6/5 at 2:15. Spoke to pt to inform them of appt.

## 2015-09-26 NOTE — Telephone Encounter (Addendum)
Discussed with Dr. Hart Rochester.  Advised to refill Oxycodone 5/325mg  ; #6; no refills.  Advise pt. that this will be the only refill, and he will need to take OTC analgesic after this. Pt. Notified of Dr. Candie Chroman recommendation.  Verb. Understanding.

## 2015-09-26 NOTE — Telephone Encounter (Signed)
-----   Message from Sharee Pimple, RN sent at 09/22/2015 12:02 PM EDT ----- Regarding: schedule   ----- Message -----    From: Lars Mage, PA-C    Sent: 09/22/2015  11:22 AM      To: [facility] Charge Pool  F/U with Dr. Hart Rochester in 4 weeks with fistula duplex s/p left fore arm av fistula

## 2015-09-27 ENCOUNTER — Telehealth: Payer: Self-pay | Admitting: *Deleted

## 2015-09-27 MED ORDER — UREA 40 % EX OINT: TOPICAL_OINTMENT | CUTANEOUS | Status: DC | PRN

## 2015-09-27 NOTE — Telephone Encounter (Addendum)
[facility] [phone] faxed statement Gordon's Urea 40% ointment is not available but does come in a cream.  Dr. Ardelle Anton ordered change to Gordon's 40% urea Cream and fill as ordered.  Called to [facility] [phone].  09/27/2015-Pt called for cream for the arthritis in his feet.  09/27/2015-Pt called he had not received a prescription for an arthritis cream.  Dr. Ardelle Anton ordered [facility] Antiinflammatory Cream.  I informed pt.  Faxed to [employer].

## 2015-09-28 MED ORDER — NONFORMULARY OR COMPOUNDED ITEM: Status: AC

## 2015-09-28 NOTE — Telephone Encounter (Signed)
Can do anti-inflammatory compound cream

## 2015-09-30 ENCOUNTER — Telehealth: Payer: Self-pay | Admitting: Cardiology

## 2015-09-30 ENCOUNTER — Encounter (HOSPITAL_BASED_OUTPATIENT_CLINIC_OR_DEPARTMENT_OTHER): Payer: Self-pay | Admitting: Cardiology

## 2015-09-30 NOTE — Procedures (Signed)
   Patient Name: [name], Charles Montgomery MRN: [id number] Study Date: 09/22/2015 Gender: Male D.O.B: Aug 25, 1965 Age (years): 61 Referring Provider: Armanda Magic MD, ABSM Interpreting Physician: Armanda Magic MD, ABSM RPSGT: Cherylann Parr  BMI: 47 Height (inches): 70 Neck Size: 20.00 Weight (lbs): 328  CLINICAL INFORMATION The patient is referred for a BiPAP titration to treat sleep apnea.   SLEEP STUDY TECHNIQUE As per the AASM Manual for the Scoring of Sleep and Associated Events v2.3 (April 2016) with a hypopnea requiring 4% desaturations. The channels recorded and monitored were frontal, central and occipital EEG, electrooculogram (EOG), submentalis EMG (chin), nasal and oral airflow, thoracic and abdominal wall motion, anterior tibialis EMG, snore microphone, electrocardiogram, and pulse oximetry. Bilevel positive airway pressure (BPAP) was initiated at the beginning of the study and titrated to treat sleep-disordered breathing.  MEDICATIONS Medications administered by patient during sleep study : No sleep medicine administered.  RESPIRATORY PARAMETERS Optimal IPAP Pressure (cm): N/A AHI at Optimal Pressure (/hr) N/A Optimal EPAP Pressure (cm): N/A     Overall Minimal O2 (%):N/A  Minimal O2 at Optimal Pressure (%):N/A  SLEEP ARCHITECTURE Start Time:10:37:57 PM Stop Time:4:40:03 AM Total Time (min):362.1  Total Sleep Time (min):330.5 Sleep Latency (min):1.9 Sleep Efficiency (%):91.3 REM Latency (min):94.0 WASO (min):29.7 Stage N1 (%): 3.03 Stage N2 (%): 86.23 Stage N3 (%): 0.00 Stage R (%):10.74 Supine (%):100.00 Arousal Index (/hr):0.9      CARDIAC DATA The 2 lead EKG demonstrated sinus rhythm. The mean heart rate was 96.55 beats per minute. Other EKG findings include: None.  LEG MOVEMENT DATA The total Periodic Limb Movements of Sleep (PLMS) were 10. The PLMS index was 1.82. A PLMS index of <15 is considered normal in adults.  IMPRESSIONS - An optimal PAP pressure could  not be selected for this patient due to ongoing respiratory events. - Central sleep apnea was not noted during this titration (CAI = 2.5/h). - Severe oxygen desaturations were observed during this titration (min O2 = 78.00%). - The patient snored with Soft snoring volume. - No cardiac abnormalities were observed during this study. - Clinically significant periodic limb movements were not noted during this study. Arousals associated with PLMs were rare.  DIAGNOSIS - Obstructive Sleep Apnea (327.23 [G47.33 ICD-10])  RECOMMENDATIONS - Trial of auto BiPAP therapy with EPAP of 4cm H2O  and PS of 4-18cm H2O with a Medium size Fisher&Paykel Full Face Mask Simplus mask and heated humidification. - Avoid alcohol, sedatives and other CNS depressants that may worsen sleep apnea and disrupt normal sleep architecture. - Sleep hygiene should be reviewed to assess factors that may improve sleep quality. - Weight management and regular exercise should be initiated or continued. - Return to Sleep Center for re-evaluation after 10 weeks of therapy   Armanda Magic Diplomate, American Board of Sleep Medicine  ELECTRONICALLY SIGNED ON:  09/30/2015, 9:44 PM  [facility] PH: [phone]   FX: [phone] ACCREDITED BY THE AMERICAN ACADEMY OF SLEEP MEDICINE

## 2015-09-30 NOTE — Telephone Encounter (Signed)
Pt had difficult BiPAP titration. I have ordered a BiPAP  autotitration with DME. Please setup appointment in 10 weeks. Please let [facility] know that order for PAP is in EPIC.

## 2015-10-03 NOTE — Telephone Encounter (Signed)
Patient is aware of information and stated verbal understanding. [facility] notified of orders.  Once patient has started BiPAP Therapy, I will schedule 10 week follow-up.

## 2015-10-10 ENCOUNTER — Other Ambulatory Visit: Payer: Self-pay | Admitting: *Deleted

## 2015-10-10 DIAGNOSIS — G4733 Obstructive sleep apnea (adult) (pediatric): Secondary | ICD-10-CM

## 2015-10-11 ENCOUNTER — Encounter (INDEPENDENT_AMBULATORY_CARE_PROVIDER_SITE_OTHER): Payer: BLUE CROSS/BLUE SHIELD | Admitting: Ophthalmology

## 2015-10-11 DIAGNOSIS — H35033 Hypertensive retinopathy, bilateral: Secondary | ICD-10-CM

## 2015-10-11 DIAGNOSIS — E11311 Type 2 diabetes mellitus with unspecified diabetic retinopathy with macular edema: Secondary | ICD-10-CM

## 2015-10-11 DIAGNOSIS — H43813 Vitreous degeneration, bilateral: Secondary | ICD-10-CM | POA: Diagnosis not present

## 2015-10-11 DIAGNOSIS — I10 Essential (primary) hypertension: Secondary | ICD-10-CM | POA: Diagnosis not present

## 2015-10-11 DIAGNOSIS — E113313 Type 2 diabetes mellitus with moderate nonproliferative diabetic retinopathy with macular edema, bilateral: Secondary | ICD-10-CM | POA: Diagnosis not present

## 2015-10-13 ENCOUNTER — Ambulatory Visit (HOSPITAL_COMMUNITY)
Admission: RE | Admit: 2015-10-13 | Discharge: 2015-10-13 | Disposition: A | Discharge status: Home / Self Care | Payer: BLUE CROSS/BLUE SHIELD | Source: Ambulatory Visit | Attending: Vascular Surgery | Admitting: Vascular Surgery

## 2015-10-13 DIAGNOSIS — E7800 Pure hypercholesterolemia, unspecified: Secondary | ICD-10-CM | POA: Insufficient documentation

## 2015-10-13 DIAGNOSIS — N186 End stage renal disease: Secondary | ICD-10-CM | POA: Diagnosis not present

## 2015-10-13 DIAGNOSIS — F17210 Nicotine dependence, cigarettes, uncomplicated: Secondary | ICD-10-CM | POA: Insufficient documentation

## 2015-10-13 DIAGNOSIS — I132 Hypertensive heart and chronic kidney disease with heart failure and with stage 5 chronic kidney disease, or end stage renal disease: Secondary | ICD-10-CM | POA: Insufficient documentation

## 2015-10-13 DIAGNOSIS — K219 Gastro-esophageal reflux disease without esophagitis: Secondary | ICD-10-CM | POA: Diagnosis not present

## 2015-10-13 DIAGNOSIS — E1142 Type 2 diabetes mellitus with diabetic polyneuropathy: Secondary | ICD-10-CM | POA: Diagnosis not present

## 2015-10-13 DIAGNOSIS — E11319 Type 2 diabetes mellitus with unspecified diabetic retinopathy without macular edema: Secondary | ICD-10-CM | POA: Diagnosis not present

## 2015-10-13 DIAGNOSIS — G4733 Obstructive sleep apnea (adult) (pediatric): Secondary | ICD-10-CM | POA: Diagnosis not present

## 2015-10-13 DIAGNOSIS — Z4931 Encounter for adequacy testing for hemodialysis: Secondary | ICD-10-CM | POA: Diagnosis not present

## 2015-10-13 DIAGNOSIS — Y838 Other surgical procedures as the cause of abnormal reaction of the patient, or of later complication, without mention of misadventure at the time of the procedure: Secondary | ICD-10-CM | POA: Insufficient documentation

## 2015-10-13 DIAGNOSIS — E1122 Type 2 diabetes mellitus with diabetic chronic kidney disease: Secondary | ICD-10-CM | POA: Insufficient documentation

## 2015-10-13 DIAGNOSIS — T82898A Other specified complication of vascular prosthetic devices, implants and grafts, initial encounter: Secondary | ICD-10-CM | POA: Insufficient documentation

## 2015-10-13 DIAGNOSIS — I509 Heart failure, unspecified: Secondary | ICD-10-CM | POA: Diagnosis not present

## 2015-10-17 ENCOUNTER — Encounter: Payer: Self-pay | Admitting: Vascular Surgery

## 2015-10-23 ENCOUNTER — Ambulatory Visit (INDEPENDENT_AMBULATORY_CARE_PROVIDER_SITE_OTHER): Payer: Self-pay | Admitting: Vascular Surgery

## 2015-10-23 ENCOUNTER — Other Ambulatory Visit: Payer: Self-pay

## 2015-10-23 ENCOUNTER — Encounter: Payer: Self-pay | Admitting: Vascular Surgery

## 2015-10-23 VITALS — BP 121/89 | HR 106 | Ht 70.0 in | Wt 307.2 lb

## 2015-10-23 DIAGNOSIS — N184 Chronic kidney disease, stage 4 (severe): Secondary | ICD-10-CM

## 2015-10-23 NOTE — Progress Notes (Signed)
Subjective:     Patient ID: Charles Montgomery, male   DOB: 01/26/1966, 50 y.o.   MRN: [id number]  HPI this 50-year-old male with chronic kidney disease stage IV had left radial-cephalic AV fistula created by me on day 10/07/2015. He returns today for follow-up. The cephalic vein is noted to be thrombosed in the forearm. There is good radial flow to the wrist. Vein was thought to be borderline in size at the time of surgery but the upper arm vein is of good caliber area patient denies any pain or numbness in the left hand. He is not yet on hemodialysis.  Past Medical History  Diagnosis Date  . Hypertension   . High cholesterol   . GERD (gastroesophageal reflux disease)   . Obesity   . Hyperlipidemia   . Systolic heart failure (HCC)   . CKD (chronic kidney disease)   . Leg DVT (deep venous thromboembolism), chronic (HCC)   . Nephrotic syndrome   . Fatigue   . Snores   . Cigarette nicotine dependence, uncomplicated   . 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 DCM (dilated cardiomyopathy) (HCC) 04/12/2015  . Shortness of breath dyspnea   . CHF (congestive heart failure) (HCC)   . OSA (obstructive sleep apnea) 06/26/2015    Severe with AHI 118/hr now on CPAP at 20cm H2O - no longer using cpap, being tested for bipap  . Hypothyroid     pt denies this  . Diabetes mellitus     type  . Diabetic neuropathy (HCC)   . Diabetic retinopathy of left eye (HCC)   . Depression   . Peripheral neuropathy (HCC)   . Macular edema     Social History  Substance Use Topics  . Smoking status: Former Smoker -- 0.25 packs/day for 30 years    Types: Cigarettes    Quit date: 05/09/2015  . Smokeless tobacco: Never Used  . Alcohol Use: No     Comment: rarely    Family History  Problem Relation Age of Onset  . Leukemia Mother   . Hypertension Father   . Diabetes type II Father   . Deep vein thrombosis Father   . Renal Disease Father     renal transplant  .  Healthy Sister   . Healthy Brother     No Known Allergies   Current outpatient prescriptions:  .  apixaban (ELIQUIS) 2.5 MG TABS tablet, Take 2.5 mg by mouth 2 (two) times daily., Disp: , Rfl:  .  Blood Glucose Calibration (CLEVER CHOICE GLUCOSE CONTROL) HIGH LIQD, by In Vitro route., Disp: , Rfl:  .  buPROPion (WELLBUTRIN SR) 150 MG 12 hr tablet, Take 150 mg by mouth daily., Disp: , Rfl:  .  carvedilol (COREG) 25 MG tablet, Take 1 tablet (25 mg total) by mouth 2 (two) times daily with a meal., Disp: 60 tablet, Rfl: 11 .  ciclopirox (PENLAC) 8 % solution, Apply topically at bedtime. Apply over nail and surrounding skin. Apply daily over previous coat. After seven (7) days, may remove with alcohol and continue cycle., Disp: 6.6 mL, Rfl: 4 .  Dulaglutide (TRULICITY) 1.5 MG/0.5ML SOPN, Inject 1.5 mg into the skin every Wednesday. , Disp: , Rfl:  .  fenofibrate (TRICOR) 48 MG tablet, Take 48 mg by mouth 2 (two) times daily. Reported on 05/08/2015, Disp: , Rfl:  .  furosemide (LASIX) 40 MG tablet, Take 1 tablet (40 mg total) by mouth 2 (two) times daily., Disp: 60 tablet, Rfl:   0 .  HUMALOG KWIKPEN 200 UNIT/ML SOPN, Inject 0-20 Units into the skin 3 (three) times daily as needed. Based on sliding scale: If BG is greater than 200, inject 20 units into the skin once If BG is less than 200, DO NOT use any, Disp: , Rfl:  .  hydrALAZINE (APRESOLINE) 50 MG tablet, Take 1 tablet (50 mg total) by mouth 3 (three) times daily., Disp: 90 tablet, Rfl: 6 .  insulin detemir (LEVEMIR) 100 UNIT/ML injection, Inject 50 Units into the skin 3 (three) times daily. , Disp: , Rfl:  .  NONFORMULARY OR COMPOUNDED ITEM, [facility] compound:  Antiinflammatory cream - Diclofenac 3%, Baclofen 2%, Cyclobenzaprine 2%, Lidocaine 2%, dispense 180gram, apply 1-2 grams to affected area 3-4 times daily, +4refills., Disp: 180 each, Rfl: 4 .  oxyCODONE-acetaminophen (PERCOCET/ROXICET) 5-325 MG tablet, Take 1 tablet by mouth every 6  (six) hours as needed., Disp: 6 tablet, Rfl: 0 .  urea (GORDONS UREA) 40 % ointment, Apply topically as needed., Disp: 30 g, Rfl: 0  Filed Vitals:   10/23/15 1415  BP: 121/89  Pulse: 106  Height: 5\' 10"  (1.778 m)  Weight: 307 lb 3.2 oz (139.345 kg)  SpO2: 96%    Body mass index is 44.08 kg/(m^2).           Review of Systems patient has history of sleep apnea, hypertension, hyperlipidemia, GERD, chronic renal insufficiency.     Objective:   Physical Exam BP 121/89 mmHg  Pulse 106  Ht 5\' 10"  (1.778 m)  Wt 307 lb 3.2 oz (139.345 kg)  BMI 44.08 kg/m2  SpO2 96%    Gen.-alert and oriented x3 in no apparent distress-morbidly obese HEENT normal for age Lungs no rhonchi or wheezing Cardiovascular regular rhythm no murmurs carotid pulses 3+ palpable no bruits audible Abdomen soft nontender no palpable masses Musculoskeletal free of  major deformities Skin clear -no rashes Neurologic normal Lower extremities 3+ femoral and dorsalis pedis pulses palpable bilaterally with no edema Left upper extremity with well-healed incision proximal to wrist. No pulse or palpable thrill and radial-cephalic AV fistula.  The duplex scan of the fistula confirm that the fistula is in fact occluded in the forearm.  I performed a independent sono site exam to confirm the above findings as well as 2 image the cephalic vein in the upper arm which appears to be of adequate size or fistula creation.       Assessment:     Chronic kidney disease stage IV disease needs vascular access Unsuccessful attempt at left radial-cephalic AV fistula    Plan:     We'll plan to create left brachial cephalic AV fistula on Wednesday of this week-June 7 Risks and benefits thoroughly discussed with patient and he would like to proceed

## 2015-10-24 ENCOUNTER — Encounter (HOSPITAL_COMMUNITY): Payer: Self-pay | Admitting: *Deleted

## 2015-10-24 MED ORDER — DEXTROSE 5 % IV SOLN
1.5000 g | INTRAVENOUS | Status: AC
  Administered 2015-10-25: 1.5 g via INTRAVENOUS
  Filled 2015-10-24: qty 1.5

## 2015-10-24 NOTE — Progress Notes (Signed)
Patient instructed to take 1/2 dose Levemir 10/24/15 pm.  Dr Hart Rochester instructed patient to not take Levemir in am unless CBG is high. I instructed if CBG is > 220 to take 1/2 of cprrection dose of Insulin in am.  I instructed patient to check CBG to check CBG and if it is less than 70 to treat it with Glucose Gel, Glucose tablets or 1/2 cup of clear juice like apple juice or cranberry juice, or 1/2 cup of regular soda. (not cream soda). I instructed patient to recheck CBG in 15 minutes and if CBG is not greater than 70, to  Call [phone] (pre- op). If it is before pre-op opens to retreat as before and recheck CBG in 15 minutes. I told patient to make note of time that liquid is taken and amount, that surgical time may have to be adjusted.

## 2015-10-25 ENCOUNTER — Encounter (HOSPITAL_COMMUNITY): Payer: Self-pay | Admitting: *Deleted

## 2015-10-25 ENCOUNTER — Ambulatory Visit (HOSPITAL_COMMUNITY)
Admission: RE | Admit: 2015-10-25 | Discharge: 2015-10-25 | Disposition: A | Discharge status: Home / Self Care | Payer: BLUE CROSS/BLUE SHIELD | Source: Ambulatory Visit | Attending: Vascular Surgery | Admitting: Vascular Surgery

## 2015-10-25 ENCOUNTER — Other Ambulatory Visit: Payer: Self-pay

## 2015-10-25 ENCOUNTER — Ambulatory Visit (HOSPITAL_COMMUNITY): Payer: BLUE CROSS/BLUE SHIELD | Admitting: Anesthesiology

## 2015-10-25 ENCOUNTER — Encounter (HOSPITAL_COMMUNITY)
Admission: RE | Disposition: A | Discharge status: Home / Self Care | Payer: Self-pay | Source: Ambulatory Visit | Attending: Vascular Surgery

## 2015-10-25 DIAGNOSIS — G4733 Obstructive sleep apnea (adult) (pediatric): Secondary | ICD-10-CM | POA: Insufficient documentation

## 2015-10-25 DIAGNOSIS — Z6841 Body Mass Index (BMI) 40.0 and over, adult: Secondary | ICD-10-CM | POA: Diagnosis not present

## 2015-10-25 DIAGNOSIS — E039 Hypothyroidism, unspecified: Secondary | ICD-10-CM | POA: Diagnosis not present

## 2015-10-25 DIAGNOSIS — F329 Major depressive disorder, single episode, unspecified: Secondary | ICD-10-CM | POA: Insufficient documentation

## 2015-10-25 DIAGNOSIS — I5022 Chronic systolic (congestive) heart failure: Secondary | ICD-10-CM | POA: Insufficient documentation

## 2015-10-25 DIAGNOSIS — E1122 Type 2 diabetes mellitus with diabetic chronic kidney disease: Secondary | ICD-10-CM | POA: Diagnosis not present

## 2015-10-25 DIAGNOSIS — K219 Gastro-esophageal reflux disease without esophagitis: Secondary | ICD-10-CM | POA: Diagnosis not present

## 2015-10-25 DIAGNOSIS — N186 End stage renal disease: Secondary | ICD-10-CM

## 2015-10-25 DIAGNOSIS — I129 Hypertensive chronic kidney disease with stage 1 through stage 4 chronic kidney disease, or unspecified chronic kidney disease: Secondary | ICD-10-CM | POA: Diagnosis present

## 2015-10-25 DIAGNOSIS — I420 Dilated cardiomyopathy: Secondary | ICD-10-CM | POA: Diagnosis not present

## 2015-10-25 DIAGNOSIS — N184 Chronic kidney disease, stage 4 (severe): Secondary | ICD-10-CM | POA: Insufficient documentation

## 2015-10-25 DIAGNOSIS — Z79899 Other long term (current) drug therapy: Secondary | ICD-10-CM | POA: Diagnosis not present

## 2015-10-25 DIAGNOSIS — I130 Hypertensive heart and chronic kidney disease with heart failure and stage 1 through stage 4 chronic kidney disease, or unspecified chronic kidney disease: Secondary | ICD-10-CM | POA: Insufficient documentation

## 2015-10-25 DIAGNOSIS — Z86718 Personal history of other venous thrombosis and embolism: Secondary | ICD-10-CM | POA: Insufficient documentation

## 2015-10-25 DIAGNOSIS — E785 Hyperlipidemia, unspecified: Secondary | ICD-10-CM | POA: Insufficient documentation

## 2015-10-25 DIAGNOSIS — E11319 Type 2 diabetes mellitus with unspecified diabetic retinopathy without macular edema: Secondary | ICD-10-CM | POA: Diagnosis not present

## 2015-10-25 DIAGNOSIS — E1151 Type 2 diabetes mellitus with diabetic peripheral angiopathy without gangrene: Secondary | ICD-10-CM | POA: Diagnosis not present

## 2015-10-25 DIAGNOSIS — I2510 Atherosclerotic heart disease of native coronary artery without angina pectoris: Secondary | ICD-10-CM | POA: Insufficient documentation

## 2015-10-25 DIAGNOSIS — Z7901 Long term (current) use of anticoagulants: Secondary | ICD-10-CM | POA: Diagnosis not present

## 2015-10-25 DIAGNOSIS — Z87891 Personal history of nicotine dependence: Secondary | ICD-10-CM | POA: Diagnosis not present

## 2015-10-25 DIAGNOSIS — Z48812 Encounter for surgical aftercare following surgery on the circulatory system: Secondary | ICD-10-CM

## 2015-10-25 HISTORY — DX: Polyneuropathy, unspecified: G62.9

## 2015-10-25 HISTORY — DX: Adverse effect of unspecified anesthetic, initial encounter: T41.45XA

## 2015-10-25 HISTORY — DX: Anemia, unspecified: D64.9

## 2015-10-25 HISTORY — PX: AV FISTULA PLACEMENT: SHX1204

## 2015-10-25 HISTORY — DX: Other complications of anesthesia, initial encounter: T88.59XA

## 2015-10-25 HISTORY — DX: Unspecified osteoarthritis, unspecified site: M19.90

## 2015-10-25 LAB — POCT I-STAT 4, (NA,K, GLUC, HGB,HCT)
GLUCOSE: 144 mg/dL — AB (ref 65–99)
HEMATOCRIT: 36 % — AB (ref 39.0–52.0)
HEMOGLOBIN: 12.2 g/dL — AB (ref 13.0–17.0)
POTASSIUM: 3.9 mmol/L (ref 3.5–5.1)
SODIUM: 142 mmol/L (ref 135–145)

## 2015-10-25 LAB — GLUCOSE, CAPILLARY: Glucose-Capillary: 169 mg/dL — ABNORMAL HIGH (ref 65–99)

## 2015-10-25 SURGERY — ARTERIOVENOUS (AV) FISTULA CREATION
Anesthesia: Monitor Anesthesia Care | Site: Arm Upper | Laterality: Left

## 2015-10-25 MED ORDER — OXYCODONE-ACETAMINOPHEN 10-325 MG PO TABS: 1.0000 | ORAL_TABLET | ORAL | Status: DC | PRN

## 2015-10-25 MED ORDER — CARVEDILOL 12.5 MG PO TABS
ORAL_TABLET | ORAL | Status: AC
  Filled 2015-10-25: qty 2

## 2015-10-25 MED ORDER — SODIUM CHLORIDE 0.9 % IV SOLN
INTRAVENOUS | Status: DC
  Administered 2015-10-25: 10 mL/h via INTRAVENOUS

## 2015-10-25 MED ORDER — LIDOCAINE-EPINEPHRINE (PF) 1 %-1:200000 IJ SOLN
INTRAMUSCULAR | Status: AC
  Filled 2015-10-25: qty 30

## 2015-10-25 MED ORDER — PROPOFOL 10 MG/ML IV BOLUS
INTRAVENOUS | Status: AC
  Filled 2015-10-25: qty 20

## 2015-10-25 MED ORDER — MIDAZOLAM HCL 5 MG/5ML IJ SOLN
INTRAMUSCULAR | Status: DC | PRN
  Administered 2015-10-25: .5 mg via INTRAVENOUS
  Administered 2015-10-25: 1 mg via INTRAVENOUS
  Administered 2015-10-25: .5 mg via INTRAVENOUS
  Administered 2015-10-25: 2 mg via INTRAVENOUS

## 2015-10-25 MED ORDER — CARVEDILOL 25 MG PO TABS
25.0000 mg | ORAL_TABLET | Freq: Two times a day (BID) | ORAL | Status: DC
  Administered 2015-10-25: 25 mg via ORAL

## 2015-10-25 MED ORDER — MIDAZOLAM HCL 2 MG/2ML IJ SOLN
INTRAMUSCULAR | Status: AC
  Filled 2015-10-25: qty 2

## 2015-10-25 MED ORDER — PROPOFOL 500 MG/50ML IV EMUL
INTRAVENOUS | Status: AC
  Filled 2015-10-25: qty 100

## 2015-10-25 MED ORDER — LIDOCAINE-EPINEPHRINE (PF) 1 %-1:200000 IJ SOLN
INTRAMUSCULAR | Status: DC | PRN
  Administered 2015-10-25: 29 mL via INTRADERMAL

## 2015-10-25 MED ORDER — LACTATED RINGERS IV SOLN: INTRAVENOUS | Status: DC

## 2015-10-25 MED ORDER — FENTANYL CITRATE (PF) 250 MCG/5ML IJ SOLN
INTRAMUSCULAR | Status: AC
  Filled 2015-10-25: qty 5

## 2015-10-25 MED ORDER — FENTANYL CITRATE (PF) 100 MCG/2ML IJ SOLN: 25.0000 ug | INTRAMUSCULAR | Status: DC | PRN

## 2015-10-25 MED ORDER — PROMETHAZINE HCL 25 MG/ML IJ SOLN: 6.2500 mg | INTRAMUSCULAR | Status: DC | PRN

## 2015-10-25 MED ORDER — SODIUM CHLORIDE 0.9 % IV SOLN
INTRAVENOUS | Status: DC | PRN
  Administered 2015-10-25: 500 mL

## 2015-10-25 MED ORDER — MEPERIDINE HCL 25 MG/ML IJ SOLN: 6.2500 mg | INTRAMUSCULAR | Status: DC | PRN

## 2015-10-25 MED ORDER — CHLORHEXIDINE GLUCONATE CLOTH 2 % EX PADS: 6.0000 | MEDICATED_PAD | Freq: Once | CUTANEOUS | Status: DC

## 2015-10-25 MED ORDER — 0.9 % SODIUM CHLORIDE (POUR BTL) OPTIME
TOPICAL | Status: DC | PRN
  Administered 2015-10-25: 1000 mL

## 2015-10-25 MED ORDER — FENTANYL CITRATE (PF) 100 MCG/2ML IJ SOLN
INTRAMUSCULAR | Status: DC | PRN
  Administered 2015-10-25 (×3): 50 ug via INTRAVENOUS

## 2015-10-25 SURGICAL SUPPLY — 24 items
ARMBAND PINK RESTRICT EXTREMIT (MISCELLANEOUS) ×3 IMPLANT
CANISTER SUCTION 2500CC (MISCELLANEOUS) ×3 IMPLANT
CLIP TI MEDIUM 6 (CLIP) ×3 IMPLANT
CLIP TI WIDE RED SMALL 6 (CLIP) ×3 IMPLANT
COVER PROBE W GEL 5X96 (DRAPES) IMPLANT
DRAIN PENROSE 1/2X12 LTX STRL (WOUND CARE) ×3 IMPLANT
ELECT REM PT RETURN 9FT ADLT (ELECTROSURGICAL) ×3
ELECTRODE REM PT RTRN 9FT ADLT (ELECTROSURGICAL) ×1 IMPLANT
GEL ULTRASOUND 20GR AQUASONIC (MISCELLANEOUS) IMPLANT
GLOVE SS BIOGEL STRL SZ 7 (GLOVE) ×1 IMPLANT
GLOVE SUPERSENSE BIOGEL SZ 7 (GLOVE) ×2
GOWN STRL REUS W/ TWL LRG LVL3 (GOWN DISPOSABLE) ×3 IMPLANT
GOWN STRL REUS W/TWL LRG LVL3 (GOWN DISPOSABLE) ×6
KIT BASIN OR (CUSTOM PROCEDURE TRAY) ×3 IMPLANT
KIT ROOM TURNOVER OR (KITS) ×3 IMPLANT
LIQUID BAND (GAUZE/BANDAGES/DRESSINGS) ×3 IMPLANT
NS IRRIG 1000ML POUR BTL (IV SOLUTION) ×3 IMPLANT
PACK CV ACCESS (CUSTOM PROCEDURE TRAY) ×3 IMPLANT
PAD ARMBOARD 7.5X6 YLW CONV (MISCELLANEOUS) ×6 IMPLANT
SUT PROLENE 6 0 BV (SUTURE) ×3 IMPLANT
SUT VIC AB 3-0 SH 27 (SUTURE) ×2
SUT VIC AB 3-0 SH 27X BRD (SUTURE) ×1 IMPLANT
UNDERPAD 30X30 INCONTINENT (UNDERPADS AND DIAPERS) ×3 IMPLANT
WATER STERILE IRR 1000ML POUR (IV SOLUTION) ×3 IMPLANT

## 2015-10-25 NOTE — Anesthesia Preprocedure Evaluation (Signed)
Anesthesia Evaluation  Patient identified by MRN, date of birth, ID band Patient awake    Reviewed: Allergy & Precautions, NPO status , Patient's Chart, lab work & pertinent test results, reviewed documented beta blocker date and time   Airway Mallampati: III  TM Distance: >3 FB Neck ROM: Full    Dental  (+) Teeth Intact   Pulmonary sleep apnea , former smoker,    breath sounds clear to auscultation       Cardiovascular hypertension, Pt. on medications and Pt. on home beta blockers + CAD, + Peripheral Vascular Disease and +CHF   Rhythm:Regular Rate:Normal     Neuro/Psych PSYCHIATRIC DISORDERS Depression  Neuromuscular disease    GI/Hepatic GERD  ,  Endo/Other  diabetes, Type 1Hypothyroidism   Renal/GU ESRFRenal disease  negative genitourinary   Musculoskeletal  (+) Arthritis ,   Abdominal   Peds negative pediatric ROS (+)  Hematology   Anesthesia Other Findings   Reproductive/Obstetrics                             Anesthesia Physical Anesthesia Plan  ASA: III  Anesthesia Plan: MAC   Post-op Pain Management:    Induction: Intravenous  Airway Management Planned: Natural Airway  Additional Equipment:   Intra-op Plan:   Post-operative Plan:   Informed Consent: I have reviewed the patients History and Physical, chart, labs and discussed the procedure including the risks, benefits and alternatives for the proposed anesthesia with the patient or authorized representative who has indicated his/her understanding and acceptance.     Plan Discussed with: CRNA  Anesthesia Plan Comments:         Anesthesia Quick Evaluation

## 2015-10-25 NOTE — H&P (View-Only) (Signed)
Subjective:     Patient ID: Charles Montgomery, male   DOB: 01/19/1966, 50 y.o.   MRN: [id number]  HPI this 50 year old male with chronic kidney disease stage IV had left radial-cephalic AV fistula created by me on day 10/07/2015. He returns today for follow-up. The cephalic vein is noted to be thrombosed in the forearm. There is good radial flow to the wrist. Vein was thought to be borderline in size at the time of surgery but the upper arm vein is of good caliber area patient denies any pain or numbness in the left hand. He is not yet on hemodialysis.  Past Medical History  Diagnosis Date  . Hypertension   . High cholesterol   . GERD (gastroesophageal reflux disease)   . Obesity   . Hyperlipidemia   . Systolic heart failure (HCC)   . CKD (chronic kidney disease)   . Leg DVT (deep venous thromboembolism), chronic (HCC)   . Nephrotic syndrome   . Fatigue   . Snores   . Cigarette nicotine dependence, uncomplicated   . 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 DCM (dilated cardiomyopathy) (HCC) 04/12/2015  . Shortness of breath dyspnea   . CHF (congestive heart failure) (HCC)   . OSA (obstructive sleep apnea) 06/26/2015    Severe with AHI 118/hr now on CPAP at 20cm H2O - no longer using cpap, being tested for bipap  . Hypothyroid     pt denies this  . Diabetes mellitus     type  . Diabetic neuropathy (HCC)   . Diabetic retinopathy of left eye (HCC)   . Depression   . Peripheral neuropathy (HCC)   . Macular edema     Social History  Substance Use Topics  . Smoking status: Former Smoker -- 0.25 packs/day for 30 years    Types: Cigarettes    Quit date: 05/09/2015  . Smokeless tobacco: Never Used  . Alcohol Use: No     Comment: rarely    Family History  Problem Relation Age of Onset  . Leukemia Mother   . Hypertension Father   . Diabetes type II Father   . Deep vein thrombosis Father   . Renal Disease Father     renal transplant  .  Healthy Sister   . Healthy Brother     No Known Allergies   Current outpatient prescriptions:  .  apixaban (ELIQUIS) 2.5 MG TABS tablet, Take 2.5 mg by mouth 2 (two) times daily., Disp: , Rfl:  .  Blood Glucose Calibration (CLEVER CHOICE GLUCOSE CONTROL) HIGH LIQD, by In Vitro route., Disp: , Rfl:  .  buPROPion (WELLBUTRIN SR) 150 MG 12 hr tablet, Take 150 mg by mouth daily., Disp: , Rfl:  .  carvedilol (COREG) 25 MG tablet, Take 1 tablet (25 mg total) by mouth 2 (two) times daily with a meal., Disp: 60 tablet, Rfl: 11 .  ciclopirox (PENLAC) 8 % solution, Apply topically at bedtime. Apply over nail and surrounding skin. Apply daily over previous coat. After seven (7) days, may remove with alcohol and continue cycle., Disp: 6.6 mL, Rfl: 4 .  Dulaglutide (TRULICITY) 1.5 MG/0.5ML SOPN, Inject 1.5 mg into the skin every Wednesday. , Disp: , Rfl:  .  fenofibrate (TRICOR) 48 MG tablet, Take 48 mg by mouth 2 (two) times daily. Reported on 05/08/2015, Disp: , Rfl:  .  furosemide (LASIX) 40 MG tablet, Take 1 tablet (40 mg total) by mouth 2 (two) times daily., Disp: 60 tablet, Rfl:  0 .  HUMALOG KWIKPEN 200 UNIT/ML SOPN, Inject 0-20 Units into the skin 3 (three) times daily as needed. Based on sliding scale: If BG is greater than 200, inject 20 units into the skin once If BG is less than 200, DO NOT use any, Disp: , Rfl:  .  hydrALAZINE (APRESOLINE) 50 MG tablet, Take 1 tablet (50 mg total) by mouth 3 (three) times daily., Disp: 90 tablet, Rfl: 6 .  insulin detemir (LEVEMIR) 100 UNIT/ML injection, Inject 50 Units into the skin 3 (three) times daily. , Disp: , Rfl:  .  NONFORMULARY OR COMPOUNDED ITEM, [facility] compound:  Antiinflammatory cream - Diclofenac 3%, Baclofen 2%, Cyclobenzaprine 2%, Lidocaine 2%, dispense 180gram, apply 1-2 grams to affected area 3-4 times daily, +4refills., Disp: 180 each, Rfl: 4 .  oxyCODONE-acetaminophen (PERCOCET/ROXICET) 5-325 MG tablet, Take 1 tablet by mouth every 6  (six) hours as needed., Disp: 6 tablet, Rfl: 0 .  urea (GORDONS UREA) 40 % ointment, Apply topically as needed., Disp: 30 g, Rfl: 0  Filed Vitals:   10/23/15 1415  BP: 121/89  Pulse: 106  Height: 5\' 10"  (1.778 m)  Weight: 307 lb 3.2 oz (139.345 kg)  SpO2: 96%    Body mass index is 44.08 kg/(m^2).           Review of Systems patient has history of sleep apnea, hypertension, hyperlipidemia, GERD, chronic renal insufficiency.     Objective:   Physical Exam BP 121/89 mmHg  Pulse 106  Ht 5\' 10"  (1.778 m)  Wt 307 lb 3.2 oz (139.345 kg)  BMI 44.08 kg/m2  SpO2 96%    Gen.-alert and oriented x3 in no apparent distress-morbidly obese HEENT normal for age Lungs no rhonchi or wheezing Cardiovascular regular rhythm no murmurs carotid pulses 3+ palpable no bruits audible Abdomen soft nontender no palpable masses Musculoskeletal free of  major deformities Skin clear -no rashes Neurologic normal Lower extremities 3+ femoral and dorsalis pedis pulses palpable bilaterally with no edema Left upper extremity with well-healed incision proximal to wrist. No pulse or palpable thrill and radial-cephalic AV fistula.  The duplex scan of the fistula confirm that the fistula is in fact occluded in the forearm.  I performed a independent sono site exam to confirm the above findings as well as 2 image the cephalic vein in the upper arm which appears to be of adequate size or fistula creation.       Assessment:     Chronic kidney disease stage IV disease needs vascular access Unsuccessful attempt at left radial-cephalic AV fistula    Plan:     We'll plan to create left brachial cephalic AV fistula on Wednesday of this week-June 7 Risks and benefits thoroughly discussed with patient and he would like to proceed

## 2015-10-25 NOTE — Op Note (Signed)
OPERATIVE REPORT  Date of Surgery: 10/25/2015  Surgeon: Josephina Gip, MD  Assistant: Lianne Cure PA  Pre-op Diagnosis: Chronic kidney disease stage IV  Post-op Diagnosis: Same  Procedure: Procedure(s): CREATION OF LEFT ARM BRACHIOCEPHALIC ARTERIOVENOUS (AV) FISTULA   Anesthesia: Mac  EBL: Minimal  Complications: None  Procedure Details: The patient was taken the operative placed in supine position at which time the left upper extremity was prepped Betadine scrub and solution draped in routine sterile manner. After infiltration forms and Xylocaine with epinephrine transverse incision was made in the antecubital area cephalic vein was dissected free down to the deep branches ligated with 2-0 silk tie distally and divided it was gently dilated with heparinized saline and marked for orientation purposes. It was an excellent vein. Brachial artery was exposed beneath fascia encircled with Vesseloops. It was approximately 33 and half millimeters in size had an excellent pulse and normal in appearance. It was occluded proximally and distally with Vesseloops opened 15 blade extended with Potts scissors. The vein was carefully measured spatulated and anastomosed inside with 6-0 Prolene. Vesseloops then released there was good pulse and palpable thrill in the fistula. It was relatively deep because of the size of the gentleman's arm. He was good Doppler flow distally in the radial ulnar arteries which did improve with compression of the fistula. The fistula itself was imaged with the ultrasound-sono site and no significant side branches were noted. Adequate hemostasis was achieved wound closed in layers with Vicryl subcuticular fashion with Dermabond patient taken to recovery in satisfactory condition   Josephina Gip, MD 10/25/2015 11:19 AM

## 2015-10-25 NOTE — Transfer of Care (Signed)
Immediate Anesthesia Transfer of Care Note  Patient: [facility]  Procedure(s) Performed: Procedure(s): CREATION OF LEFT ARM BRACHIOCEPHALIC ARTERIOVENOUS (AV) FISTULA  (Left)  Patient Location: PACU  Anesthesia Type:MAC  Level of Consciousness: awake, alert  and oriented  Airway & Oxygen Therapy: Patient Spontanous Breathing  Post-op Assessment: Report given to RN and Post -op Vital signs reviewed and stable  Post vital signs: Reviewed and stable  Last Vitals:  Filed Vitals:   10/25/15 0810 10/25/15 1114  BP: 116/82   Pulse: 87 81  Temp: 36.8 C 36.4 C  Resp: 20 12    Last Pain: There were no vitals filed for this visit.    Patients Stated Pain Goal: 2 (10/25/15 5784)  Complications: No apparent anesthesia complications

## 2015-10-25 NOTE — Anesthesia Postprocedure Evaluation (Signed)
Anesthesia Post Note  Patient: Charles Montgomery  Procedure(s) Performed: Procedure(s) (LRB): CREATION OF LEFT ARM BRACHIOCEPHALIC ARTERIOVENOUS (AV) FISTULA  (Left)  Patient location during evaluation: PACU Anesthesia Type: MAC Level of consciousness: awake and alert Pain management: pain level controlled Vital Signs Assessment: post-procedure vital signs reviewed and stable Respiratory status: spontaneous breathing, nonlabored ventilation, respiratory function stable and patient connected to nasal cannula oxygen Cardiovascular status: stable and blood pressure returned to baseline Anesthetic complications: no    Last Vitals:  Filed Vitals:   10/25/15 1115 10/25/15 1130  BP: 104/77 110/77  Pulse:  79  Temp:  36.3 C  Resp: 16 17    Last Pain: There were no vitals filed for this visit.               [name]

## 2015-10-25 NOTE — Interval H&P Note (Signed)
History and Physical Interval Note:  10/25/2015 9:33 AM  Charles Montgomery  has presented today for surgery, with the diagnosis of End Stage Renal Disease N18.6  The various methods of treatment have been discussed with the patient and family. After consideration of risks, benefits and other options for treatment, the patient has consented to  Procedure(s): ARTERIOVENOUS (AV) FISTULA CREATION- BRACHIOCEPHALIC (Left) as a surgical intervention .  The patient's history has been reviewed, patient examined, no change in status, stable for surgery.  I have reviewed the patient's chart and labs.  Questions were answered to the patient's satisfaction.     Josephina Gip

## 2015-10-26 ENCOUNTER — Encounter (HOSPITAL_COMMUNITY): Payer: Self-pay | Admitting: Vascular Surgery

## 2015-10-26 ENCOUNTER — Telehealth: Payer: Self-pay | Admitting: Vascular Surgery

## 2015-10-26 NOTE — Telephone Encounter (Signed)
Spoke to pt to sch appt 7/18 at 11.

## 2015-10-26 NOTE — Telephone Encounter (Signed)
-----   Message from Phillips Odor, RN sent at 10/25/2015 12:10 PM EDT ----- Regarding: needs a 6 week f/u with access duplex, and any MD   ----- Message -----    From: Lars Mage, PA-C    Sent: 10/25/2015  11:10 AM      To: [facility] Charge Pool  F/U with one of our physicians in 6 weeks s/p av fistula creation by Dr. Hart Rochester

## 2015-11-08 ENCOUNTER — Encounter (INDEPENDENT_AMBULATORY_CARE_PROVIDER_SITE_OTHER): Payer: BLUE CROSS/BLUE SHIELD | Admitting: Ophthalmology

## 2015-11-08 DIAGNOSIS — E113313 Type 2 diabetes mellitus with moderate nonproliferative diabetic retinopathy with macular edema, bilateral: Secondary | ICD-10-CM

## 2015-11-08 DIAGNOSIS — E11311 Type 2 diabetes mellitus with unspecified diabetic retinopathy with macular edema: Secondary | ICD-10-CM

## 2015-11-08 DIAGNOSIS — H43813 Vitreous degeneration, bilateral: Secondary | ICD-10-CM | POA: Diagnosis not present

## 2015-11-08 DIAGNOSIS — H35033 Hypertensive retinopathy, bilateral: Secondary | ICD-10-CM

## 2015-11-08 DIAGNOSIS — I10 Essential (primary) hypertension: Secondary | ICD-10-CM

## 2015-11-13 ENCOUNTER — Encounter: Payer: Self-pay | Admitting: Vascular Surgery

## 2015-11-26 ENCOUNTER — Emergency Department (HOSPITAL_COMMUNITY)
Admission: EM | Admit: 2015-11-26 | Discharge: 2015-11-27 | Disposition: A | Discharge status: Home / Self Care | Payer: BLUE CROSS/BLUE SHIELD | Attending: Emergency Medicine | Admitting: Emergency Medicine

## 2015-11-26 ENCOUNTER — Encounter (HOSPITAL_COMMUNITY): Payer: Self-pay | Admitting: *Deleted

## 2015-11-26 DIAGNOSIS — E11311 Type 2 diabetes mellitus with unspecified diabetic retinopathy with macular edema: Secondary | ICD-10-CM | POA: Diagnosis not present

## 2015-11-26 DIAGNOSIS — T8131XA Disruption of external operation (surgical) wound, not elsewhere classified, initial encounter: Secondary | ICD-10-CM

## 2015-11-26 DIAGNOSIS — Z7901 Long term (current) use of anticoagulants: Secondary | ICD-10-CM | POA: Insufficient documentation

## 2015-11-26 DIAGNOSIS — E1142 Type 2 diabetes mellitus with diabetic polyneuropathy: Secondary | ICD-10-CM | POA: Insufficient documentation

## 2015-11-26 DIAGNOSIS — I5020 Unspecified systolic (congestive) heart failure: Secondary | ICD-10-CM | POA: Insufficient documentation

## 2015-11-26 DIAGNOSIS — E1140 Type 2 diabetes mellitus with diabetic neuropathy, unspecified: Secondary | ICD-10-CM | POA: Insufficient documentation

## 2015-11-26 DIAGNOSIS — Z794 Long term (current) use of insulin: Secondary | ICD-10-CM | POA: Insufficient documentation

## 2015-11-26 DIAGNOSIS — Z79899 Other long term (current) drug therapy: Secondary | ICD-10-CM | POA: Diagnosis not present

## 2015-11-26 DIAGNOSIS — Z87891 Personal history of nicotine dependence: Secondary | ICD-10-CM | POA: Insufficient documentation

## 2015-11-26 DIAGNOSIS — I130 Hypertensive heart and chronic kidney disease with heart failure and stage 1 through stage 4 chronic kidney disease, or unspecified chronic kidney disease: Secondary | ICD-10-CM | POA: Insufficient documentation

## 2015-11-26 DIAGNOSIS — Z4801 Encounter for change or removal of surgical wound dressing: Secondary | ICD-10-CM | POA: Insufficient documentation

## 2015-11-26 DIAGNOSIS — N189 Chronic kidney disease, unspecified: Secondary | ICD-10-CM | POA: Insufficient documentation

## 2015-11-26 NOTE — ED Provider Notes (Signed)
CSN: [id number]     Arrival date & time 11/26/15  2259 History   First MD Initiated Contact with Patient 11/26/15 2334     Chief Complaint  Patient presents with  . Wound Check     (Consider location/radiation/quality/duration/timing/severity/associated sxs/prior Treatment) HPI Comments: Patient with a history of recent AV fistula placement in left AC on 10/25/15 by Dr. Hart Rochester. He has had an uncomplicated post-surgical course until today when he noticed widening of the surgical incision and bleeding from the wound. He applied pressure and the bleeding stopped easily. Later in the day he had a second bleed and came to the emergency department for evaluation. He denies pain, fever, chills. He does state there was a "white, milky" discharge from the wound initially but none since the two bleeding episodes. He is scheduled to be seen in follow up by Dr. Hart Rochester on 12/05/15.   Patient is a 50 y.o. male presenting with wound check. The history is provided by the patient. No language interpreter was used.  Wound Check This is a new problem. Pertinent negatives include no chills, fever, nausea, numbness or weakness.    Past Medical History  Diagnosis Date  . Hypertension   . High cholesterol   . GERD (gastroesophageal reflux disease)   . Obesity   . Hyperlipidemia   . Systolic heart failure (HCC)   . Leg DVT (deep venous thromboembolism), chronic (HCC)   . Fatigue   . Snores   . Cigarette nicotine dependence, uncomplicated   . DCM (dilated cardiomyopathy) (HCC) 04/12/2015  . CHF (congestive heart failure) (HCC)   . OSA (obstructive sleep apnea) 06/26/2015    Severe with AHI 118/hr now on CPAP at 20cm H2O - no longer using cpap, being tested for bipap  . Hypothyroid     pt denies this  . Diabetes mellitus     type  . Diabetic neuropathy (HCC)   . Diabetic retinopathy of left eye (HCC)   . Depression   . Peripheral neuropathy (HCC)   . Macular edema   . Coronary artery disease     Non  obstructive per cardiac cath 05/10/15  . DVT (deep venous thrombosis) (HCC)   . Complication of anesthesia     took more medication "due to sleep apena"  . Shortness of breath dyspnea     with exertion  . Anemia   . CKD (chronic kidney disease)     neuropologist is Dr Britt Bottom  . Nephrotic syndrome   . 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 Neuropathy (HCC)   . Arthritis    Past Surgical History  Procedure Laterality Date  . Cardiac catheterization N/A 05/10/2015    Procedure: Left Heart Cath and Coronary Angiography;  Surgeon: Lyn Records, MD;  Location: [facility] INVASIVE CV LAB;  Service: Cardiovascular;  Laterality: N/A;  . Av fistula placement Left 09/22/2015    Procedure: RADIOCEPHALIC ARTERIOVENOUS (AV) FISTULA CREATION;  Surgeon: Pryor Ochoa, MD;  Location: [facility] OR;  Service: Vascular;  Laterality: Left;  . Bipap titration  09/22/2015  . Wisdom tooth extraction    . Av fistula placement Left 10/25/2015    Procedure: CREATION OF LEFT ARM BRACHIOCEPHALIC ARTERIOVENOUS (AV) FISTULA ;  Surgeon: Pryor Ochoa, MD;  Location: [facility] OR;  Service: Vascular;  Laterality: Left;   Family History  Problem Relation Age of Onset  . Leukemia Mother   . Hypertension Father   . Diabetes type II Father   . Deep  vein thrombosis Father   . Renal Disease Father     renal transplant  . Healthy Sister   . Healthy Brother    Social History  Substance Use Topics  . Smoking status: Former Smoker -- 0.25 packs/day for 30 years    Types: Cigarettes    Quit date: 05/09/2015  . Smokeless tobacco: Never Used  . Alcohol Use: No    Review of Systems  Constitutional: Negative for fever and chills.  Gastrointestinal: Negative for nausea.  Musculoskeletal:       See HPI.  Skin: Positive for wound.  Neurological: Negative for weakness and numbness.      Allergies  Review of patient's allergies indicates no known allergies.  Home Medications   Prior to  Admission medications   Medication Sig Start Date End Date Taking? Authorizing Provider  apixaban (ELIQUIS) 2.5 MG TABS tablet Take 2.5 mg by mouth 2 (two) times daily.    Historical Provider, MD  Blood Glucose Calibration (CLEVER CHOICE GLUCOSE CONTROL) HIGH LIQD by In Vitro route.    Historical Provider, MD  buPROPion (WELLBUTRIN XL) 150 MG 24 hr tablet Take 1 tablet by mouth daily. 10/22/15   Historical Provider, MD  carvedilol (COREG) 25 MG tablet Take 1 tablet (25 mg total) by mouth 2 (two) times daily with a meal. 04/12/15   Quintella Reichert, MD  ciclopirox (PENLAC) 8 % solution Apply topically at bedtime. Apply over nail and surrounding skin. Apply daily over previous coat. After seven (7) days, may remove with alcohol and continue cycle. 09/25/15   Vivi Barrack, DPM  Dulaglutide (TRULICITY) 1.5 MG/0.5ML SOPN Inject 1.5 mg into the skin every Wednesday.     Historical Provider, MD  fenofibrate (TRICOR) 48 MG tablet Take 48 mg by mouth 2 (two) times daily. Reported on 05/08/2015 04/12/15   Historical Provider, MD  furosemide (LASIX) 80 MG tablet Take 2 tablets by mouth 2 (two) times daily. 10/22/15   Historical Provider, MD  HUMALOG KWIKPEN 200 UNIT/ML SOPN Inject 0-20 Units into the skin 3 (three) times daily as needed. Based on sliding scale: If BG is greater than 200, inject 20 units into the skin once If BG is less than 200, DO NOT use any 06/13/15   Historical Provider, MD  hydrALAZINE (APRESOLINE) 50 MG tablet Take 1 tablet (50 mg total) by mouth 3 (three) times daily. 05/12/15   Bhavinkumar Bhagat, PA  insulin detemir (LEVEMIR) 100 UNIT/ML injection Inject 50 Units into the skin 3 (three) times daily.     Historical Provider, MD  NONFORMULARY OR COMPOUNDED ITEM [facility] compound:  Antiinflammatory cream - Diclofenac 3%, Baclofen 2%, Cyclobenzaprine 2%, Lidocaine 2%, dispense 180gram, apply 1-2 grams to affected area 3-4 times daily, +4refills. 09/28/15   Vivi Barrack, DPM   oxyCODONE-acetaminophen (PERCOCET) 10-325 MG tablet Take 1 tablet by mouth every 4 (four) hours as needed for pain. 10/25/15   Lars Mage, PA-C  urea (GORDONS UREA) 40 % ointment Apply topically as needed. 09/27/15   Vivi Barrack, DPM   BP 114/66 mmHg  Pulse 96  Temp(Src) 98.8 F (37.1 C) (Oral)  Resp 20  Ht  (1.803 m)  Wt 141.522 kg  BMI 43.53 kg/m2  SpO2 95% Physical Exam  Constitutional: He is oriented to person, place, and time. He appears well-developed and well-nourished.  Neck: Normal range of motion.  Pulmonary/Chest: Effort normal.  Musculoskeletal: Normal range of motion.  Left UE has healing surgical incisions to [facility]. There  is a small wound dehiscence with a slow, oozing bleed. There is a strong palpable thrill. No redness, warmth, or tenderness. No swelling.   Neurological: He is alert and oriented to person, place, and time.  Skin: Skin is warm and dry.  Psychiatric: He has a normal mood and affect.    ED Course  Procedures (including critical care time) Labs Review Labs Reviewed - No data to display  Imaging Review No results found. I have personally reviewed and evaluated these images and lab results as part of my medical decision-making.   EKG Interpretation None      MDM   Final diagnoses:  None    1. Wound dehiscence left UE  The patient presents with bleeding of the left UE surgical incision where an AV fistula was placed by Dr. Hart Rochester on 10/25/15. No evidence of infection. There is no bleeding now, just oozing that is resolved after pressure. Dr. Donnald Garre has seen the patient and examined the would and advised Xeroform dressing with moderate compression and office follow up tomorrow. The patient is involved in care plan and reports he will go to the office tomorrow to be seen. Return precautions discussed.     Elpidio Anis, PA-C 11/27/15 0010  Arby Barrette, MD 11/28/15 1359

## 2015-11-26 NOTE — ED Notes (Signed)
Patient stated he had a fistula placed about 2 weeks ago in his left [facility] and noticed it started bleeding today and again tonight.  Small opened area noted to incision.  No bleeding noted in triage. Dressing applied

## 2015-11-27 ENCOUNTER — Encounter: Payer: Self-pay | Admitting: Family

## 2015-11-27 ENCOUNTER — Telehealth: Payer: Self-pay

## 2015-11-27 ENCOUNTER — Ambulatory Visit (INDEPENDENT_AMBULATORY_CARE_PROVIDER_SITE_OTHER): Payer: Self-pay | Admitting: Family

## 2015-11-27 VITALS — BP 120/73 | HR 89 | Temp 97.8°F | Resp 16 | Ht 71.0 in | Wt 314.0 lb

## 2015-11-27 DIAGNOSIS — N184 Chronic kidney disease, stage 4 (severe): Secondary | ICD-10-CM

## 2015-11-27 DIAGNOSIS — I770 Arteriovenous fistula, acquired: Secondary | ICD-10-CM

## 2015-11-27 DIAGNOSIS — T8131XA Disruption of external operation (surgical) wound, not elsewhere classified, initial encounter: Secondary | ICD-10-CM

## 2015-11-27 MED ORDER — CEPHALEXIN 500 MG PO CAPS: 500.0000 mg | ORAL_CAPSULE | Freq: Two times a day (BID) | ORAL | Status: DC

## 2015-11-27 NOTE — ED Notes (Addendum)
The npt was in [place] yesterday  And his old wound in his lt a-c apparently opened and bled  Then stopped and later tonight the area bled again  So he came in.  Bleeding controlled on arrival

## 2015-11-27 NOTE — Telephone Encounter (Signed)
Phone call from pt.  Reported the incision of left arm has been bleeding.  Was evaluated in the ER 7/9, and advised to call office today to be seen.  Reported he noticed the incision starting to open up on Monday, 7/3.  Reported through the week the incision opened a little more and there was pink tissue visible.  Denied fever/ chills.  Then, reported it started bleeding yesterday, and was able to get the bleeding to stop, the 1st time.  Stated it started bleeding a second time, and he went to the ER.  Reported the ER MD advised him to call office for incision check today.  Stated the dressing is still intact that was placed in the ER, and no noted bleeding on the bandage.  Appt. Given for 1:45 PM today, with NP.  Pt. Agreed with plan.

## 2015-11-27 NOTE — Discharge Instructions (Signed)
Wound Dehiscence °Wound dehiscence is when a surgical cut (incision) breaks open and does not heal properly after surgery. It usually happens 7-10 days after surgery. This can be a serious condition. It is important to identify and treat this condition early.  °CAUSES  °Some common causes of wound dehiscence include: °· Stretching of the wound area. This may be caused by lifting, vomiting, violent coughing, or straining during bowel movements. °· Wound infection. °· Early stitch (suture) removal. °RISK FACTORS °Various things can increase your risk of developing wound dehiscence, including: °· Obesity. °· Lung disease. °· Smoking. °· Poor nutrition. °· Contamination during surgery. °SIGNS AND SYMPTOMS °· Bleeding from the wound. °· Pain. °· Fever. °· Wound starts breaking open. °DIAGNOSIS  °· Your health care provider may diagnose wound dehiscence by monitoring the incision and noting any changes in the wound. These changes can include an increase in drainage or pain. The health care provider may also ask you if you have noticed any stretching or tearing of the wound. °· Wound cultures may be taken to determine if there is an infection.  °· Imaging studies, such as an MRI scan or CT scan, may be done to determine if there is a collection of pus or fluid in the wound area. °TREATMENT °Treatment may include: °· Wound care. °· Surgical repair. °· Antibiotic medicine to treat or prevent infection. °· Medicines to reduce pain and swelling. °HOME CARE INSTRUCTIONS  °· Only take over-the-counter or prescription medicines for pain, discomfort, or fever as directed by your health care provider. Taking pain medicine 30 minutes before changing a bandage (dressing) can help relieve pain. °· Take your antibiotics as directed. Finish them even if you start to feel better. °· Gently wash the area with mild soap and water 2 times a day, or as directed. Rinse off the soap. Pat the area dry with a clean towel. Do not rub the wound.  This may cause bleeding. °· Follow your health care provider's instructions for how often you need to change the dressing and packing inside. Wash your hands well before and after changing your dressing. Apply a dressing to the wound as directed. °· Take showers. Do not soak the wound, bathe, swim, or use a hot tub until directed by your health care provider. °· Avoid exercises that make you sweat heavily. °· Use anti-itch medicine as directed by your health care provider. The wound may itch when it is healing. Do not pick or scratch at the wound. °· Do not lift more than 10 pounds (4.5 kg) until the wound is healed, or as directed by your health care provider. °· Keep all follow-up appointments as directed. °SEEK MEDICAL CARE IF: °· You have excessive bleeding from your surgical wound. °· Your wound does not seem to be healing properly. °· You have a fever. °SEEK IMMEDIATE MEDICAL CARE IF:  °· You have increased swelling or redness around the wound. °· You have increasing pain in the wound. °· You have an increasing amount of pus coming from the wound. °· Your wound breaks open farther. °MAKE SURE YOU:  °· Understand these instructions. °· Will watch your condition. °· Will get help right away if you are not doing well or get worse. °  °This information is not intended to replace advice given to you by your health care provider. Make sure you discuss any questions you have with your health care provider. °  °Document Released: 07/27/2003 Document Revised: 05/11/2013 Document Reviewed: 01/11/2013 °Elsevier Interactive Patient Education ©  2016 [employer] Inc. ° °

## 2015-11-27 NOTE — ED Notes (Addendum)
Iodoform gauze dressing to the lt a-c and dsd with kling and net dressing

## 2015-11-27 NOTE — Progress Notes (Signed)
    Postoperative Access Visit   History of Present Illness  Charles Montgomery is a 50 y.o. year old male patient of Dr. Hart Rochester with chronic kidney disease stage IV had left radial-cephalic AV fistula created by Dr. Hart Rochester on 10/07/2015. This AV fistula then occluded in the forearm. He then underwent a creation of left arm brachiocephalic AV fistula on 10/25/15 by Dr. Hart Rochester. Pt returns after phone call from pt today re incision of left arm has been bleeding. Was evaluated in the ER 7/9, and advised to call office today to be seen. Reported he noticed the incision starting to open up on Monday, 7/3. Reported through the week the incision opened a little more and there was pink tissue visible. Denied fever/ chills. Then, reported it started bleeding yesterday, and was able to get the bleeding to stop, the 1st time. Stated it started bleeding a second time, and he went to the ER. Reported the ER MD advised him to call office for incision check today. Stated the dressing is still intact that was placed in the ER, and no noted bleeding on the bandage. He reports shooting pain in the left forearm ever since the forearm fistula was created, has not worsened nor improved. He is not yet on hemodialysis.  He is scheduled to follow up on 12/05/15 with Dr. 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   The patient notes denie steal symptoms in the left UE.  The patient is able to complete their activities of daily living.   For VQI Use Only  PRE-ADM LIVING: Home  AMB STATUS: Ambulatory  Physical Examination Filed Vitals:   11/27/15 1339  BP: 120/73  Pulse: 89  Temp: 97.8 F (36.6 C)  TempSrc: Oral  Resp: 16  Height: 5\' 11"  (1.803 m)  Weight: 314 lb (142.429 kg)  SpO2: 98%   Body mass index is 43.81 kg/(m^2).  Left antecubital incision is healed except for shallow opening that measures 3 mm x 8 mm, no erythema, no swelling. When expressed the  incision oozed a scant amount of thick white drainage, then oozed a scant amount of blood. Skin feels warm and normal, hand grip is 5/5, sensation in digits is intact, palpable thrill at left upper arm AV fistula.   Medical Decision Making  Charles Montgomery is a 50 y.o. year old male who presents s/p creation of left arm brachiocephalic AV fistula on 10/25/15. He was seen yesterday in an ED for bleeding from a small shallow dehisced area of his AV fistula incision. The bleeding has stopped, there is scant oozing of blood when expressed.   Dr. Hart Rochester spoke with and examined pt. Keflex 500 mg bid x 2 weeks as prophylaxis.  Cleanse small opening of incision daily with warm water and soap, 2x2 gauze dressing, ace wrap for some mild compression, change dressing daily. See Dr. Arbie Cookey as scheduled on 12/05/15.   Thank you for allowing [place] to participate in this patient's care.  Nicholaus Steinke, Carma Lair, RN, MSN, FNP-C [facility] Office: [phone]  11/27/2015, 1:50 PM  Clinic MD: Hart Rochester

## 2015-11-30 ENCOUNTER — Encounter: Payer: Self-pay | Admitting: Vascular Surgery

## 2015-12-05 ENCOUNTER — Ambulatory Visit (INDEPENDENT_AMBULATORY_CARE_PROVIDER_SITE_OTHER): Payer: Self-pay | Admitting: Vascular Surgery

## 2015-12-05 ENCOUNTER — Encounter: Payer: Self-pay | Admitting: Vascular Surgery

## 2015-12-05 ENCOUNTER — Encounter: Payer: BLUE CROSS/BLUE SHIELD | Admitting: Vascular Surgery

## 2015-12-05 VITALS — BP 124/72 | HR 90 | Temp 98.5°F | Resp 20 | Ht 71.0 in | Wt 313.0 lb

## 2015-12-05 DIAGNOSIS — N184 Chronic kidney disease, stage 4 (severe): Secondary | ICD-10-CM

## 2015-12-05 NOTE — Progress Notes (Signed)
Patient name: Charles Montgomery MRN: [id number] DOB: Jul 12, 1965 Sex: male  REASON FOR VISIT: Follow-up left upper arm AV fistula creation with Dr. Hart Rochester on 6-7 2017  HPI: Charles Montgomery is a 50 y.o. male here today for follow-up. He did have some slight separation at the antecubital wound was some oozing related to this. Is on chronic anticoagulation. I this completely resolves.  Current Outpatient Prescriptions  Medication Sig Dispense Refill  . apixaban (ELIQUIS) 2.5 MG TABS tablet Take 2.5 mg by mouth 2 (two) times daily.    Marland Kitchen buPROPion (WELLBUTRIN XL) 150 MG 24 hr tablet Take 1 tablet by mouth daily.    . carvedilol (COREG) 25 MG tablet Take 1 tablet (25 mg total) by mouth 2 (two) times daily with a meal. 60 tablet 11  . cephALEXin (KEFLEX) 500 MG capsule Take 1 capsule (500 mg total) by mouth 2 (two) times daily. 28 capsule 0  . ciclopirox (PENLAC) 8 % solution Apply topically at bedtime. Apply over nail and surrounding skin. Apply daily over previous coat. After seven (7) days, may remove with alcohol and continue cycle. 6.6 mL 4  . fenofibrate (TRICOR) 48 MG tablet Take 48 mg by mouth 2 (two) times daily. Reported on 05/08/2015    . furosemide (LASIX) 80 MG tablet Take 40 mg by mouth 2 (two) times daily.     Marland Kitchen HUMALOG KWIKPEN 200 UNIT/ML SOPN Inject 0-20 Units into the skin 3 (three) times daily as needed. Based on sliding scale: If BG is greater than 200, inject 20 units into the skin once If BG is less than 200, DO NOT use any    . hydrALAZINE (APRESOLINE) 50 MG tablet Take 1 tablet (50 mg total) by mouth 3 (three) times daily. 90 tablet 6  . insulin detemir (LEVEMIR) 100 UNIT/ML injection Inject 50 Units into the skin 3 (three) times daily.     . NONFORMULARY OR COMPOUNDED ITEM [facility] compound:  Antiinflammatory cream - Diclofenac 3%, Baclofen 2%, Cyclobenzaprine 2%, Lidocaine 2%, dispense 180gram, apply 1-2 grams to affected area 3-4  times daily, +4refills. 180 each 4  . trimethoprim-polymyxin b (POLYTRIM) ophthalmic solution Place 3-4 drops into both eyes See admin instructions. Three times a day on the first day after injection, on the second day four times.  Only use for two days after eye injection once a month.    . urea (GORDONS UREA) 40 % ointment Apply topically as needed. (Patient taking differently: Apply 1 application topically daily as needed (dry skin). ) 30 g 0   No current facility-administered medications for this visit.      PHYSICAL EXAM: Filed Vitals:   12/05/15 1104  BP: 124/72  Pulse: 90  Temp: 98.5 F (36.9 C)  TempSrc: Oral  Resp: 20  Height: 5\' 11"  (1.803 m)  Weight: 313 lb (141.976 kg)  SpO2: 97%    GENERAL: The patient is a well-nourished male, in no acute distress. The vital signs are documented above. His left antecubital incision is completely healed with no evidence of infection. He has an excellent thrill in his upper arm fistula. Despite his obesity his vein does run rather superficial. I did image this with SonoSite ultrasound he has excellent early maturation  MEDICAL ISSUES: Table status post left upper arm AV fistula creation. Is not on dialysis currently. Will continue exercising his left arm. Will see [place] again on an as-needed basis.  Larina Earthly, MD FACS [facility] Tel [phone]  [phone] Pager [phone]

## 2015-12-08 ENCOUNTER — Encounter (INDEPENDENT_AMBULATORY_CARE_PROVIDER_SITE_OTHER): Payer: BLUE CROSS/BLUE SHIELD | Admitting: Ophthalmology

## 2015-12-08 DIAGNOSIS — E11311 Type 2 diabetes mellitus with unspecified diabetic retinopathy with macular edema: Secondary | ICD-10-CM

## 2015-12-08 DIAGNOSIS — H35033 Hypertensive retinopathy, bilateral: Secondary | ICD-10-CM | POA: Diagnosis not present

## 2015-12-08 DIAGNOSIS — E113313 Type 2 diabetes mellitus with moderate nonproliferative diabetic retinopathy with macular edema, bilateral: Secondary | ICD-10-CM | POA: Diagnosis not present

## 2015-12-08 DIAGNOSIS — H43813 Vitreous degeneration, bilateral: Secondary | ICD-10-CM | POA: Diagnosis not present

## 2015-12-08 DIAGNOSIS — I10 Essential (primary) hypertension: Secondary | ICD-10-CM | POA: Diagnosis not present

## 2015-12-29 ENCOUNTER — Encounter: Payer: Self-pay | Admitting: Podiatry

## 2015-12-29 ENCOUNTER — Ambulatory Visit (INDEPENDENT_AMBULATORY_CARE_PROVIDER_SITE_OTHER): Payer: BLUE CROSS/BLUE SHIELD | Admitting: Podiatry

## 2015-12-29 DIAGNOSIS — E0849 Diabetes mellitus due to underlying condition with other diabetic neurological complication: Secondary | ICD-10-CM

## 2015-12-29 DIAGNOSIS — M79676 Pain in unspecified toe(s): Secondary | ICD-10-CM | POA: Diagnosis not present

## 2015-12-29 DIAGNOSIS — B351 Tinea unguium: Secondary | ICD-10-CM | POA: Diagnosis not present

## 2015-12-29 NOTE — Progress Notes (Addendum)
Complaint:  Visit Type: Patient returns to my office for continued preventative foot care services. Complaint: Patient states" my nails have grown long and thick and become painful to walk and wear shoes" Patient has been diagnosed with DM with neuropathy. The patient presents for preventative foot care services. No changes to ROS  Podiatric Exam: Vascular: dorsalis pedis and posterior tibial pulses are palpable bilateral. Capillary return is immediate. Temperature gradient is WNL. Skin turgor WNL  Sensorium: Diminished  Semmes Weinstein monofilament test. Normal tactile sensation bilaterally. Nail Exam: Pt has thick disfigured discolored nails with subungual debris noted bilateral entire nail hallux through fifth toenails Ulcer Exam: There is no evidence of ulcer or pre-ulcerative changes or infection. Orthopedic Exam: Muscle tone and strength are WNL. No limitations in general ROM. No crepitus or effusions noted. Pes planus  HAV  B/L cith hammer toes B/L Skin: No Porokeratosis. No infection or ulcers  Diagnosis:  Onychomycosis, , Pain in right toe, pain in left toes  Treatment & Plan Procedures and Treatment: Consent by patient was obtained for treatment procedures. The patient understood the discussion of treatment and procedures well. All questions were answered thoroughly reviewed. Debridement of mycotic and hypertrophic toenails, 1 through 5 bilateral and clearing of subungual debris. No ulceration, no infection noted.  Return Visit-Office Procedure: Patient instructed to return to the office for a follow up visit 3 months for continued evaluation and treatment.    Helane Gunther DPM

## 2016-01-05 ENCOUNTER — Encounter (INDEPENDENT_AMBULATORY_CARE_PROVIDER_SITE_OTHER): Payer: BLUE CROSS/BLUE SHIELD | Admitting: Ophthalmology

## 2016-01-05 DIAGNOSIS — E10311 Type 1 diabetes mellitus with unspecified diabetic retinopathy with macular edema: Secondary | ICD-10-CM | POA: Diagnosis not present

## 2016-01-05 DIAGNOSIS — H35033 Hypertensive retinopathy, bilateral: Secondary | ICD-10-CM | POA: Diagnosis not present

## 2016-01-05 DIAGNOSIS — E103313 Type 1 diabetes mellitus with moderate nonproliferative diabetic retinopathy with macular edema, bilateral: Secondary | ICD-10-CM

## 2016-01-05 DIAGNOSIS — H43813 Vitreous degeneration, bilateral: Secondary | ICD-10-CM | POA: Diagnosis not present

## 2016-01-05 DIAGNOSIS — H2513 Age-related nuclear cataract, bilateral: Secondary | ICD-10-CM

## 2016-01-05 DIAGNOSIS — I10 Essential (primary) hypertension: Secondary | ICD-10-CM | POA: Diagnosis not present

## 2016-01-09 ENCOUNTER — Encounter (HOSPITAL_COMMUNITY): Payer: Self-pay | Admitting: Emergency Medicine

## 2016-01-09 ENCOUNTER — Emergency Department (HOSPITAL_COMMUNITY)
Admission: EM | Admit: 2016-01-09 | Discharge: 2016-01-09 | Disposition: A | Discharge status: Home / Self Care | Payer: BLUE CROSS/BLUE SHIELD | Attending: Emergency Medicine | Admitting: Emergency Medicine

## 2016-01-09 DIAGNOSIS — E1122 Type 2 diabetes mellitus with diabetic chronic kidney disease: Secondary | ICD-10-CM | POA: Diagnosis not present

## 2016-01-09 DIAGNOSIS — L989 Disorder of the skin and subcutaneous tissue, unspecified: Secondary | ICD-10-CM | POA: Diagnosis present

## 2016-01-09 DIAGNOSIS — L089 Local infection of the skin and subcutaneous tissue, unspecified: Secondary | ICD-10-CM | POA: Diagnosis not present

## 2016-01-09 DIAGNOSIS — I130 Hypertensive heart and chronic kidney disease with heart failure and stage 1 through stage 4 chronic kidney disease, or unspecified chronic kidney disease: Secondary | ICD-10-CM | POA: Diagnosis not present

## 2016-01-09 DIAGNOSIS — I5021 Acute systolic (congestive) heart failure: Secondary | ICD-10-CM | POA: Diagnosis not present

## 2016-01-09 DIAGNOSIS — N184 Chronic kidney disease, stage 4 (severe): Secondary | ICD-10-CM | POA: Diagnosis not present

## 2016-01-09 DIAGNOSIS — Z7901 Long term (current) use of anticoagulants: Secondary | ICD-10-CM | POA: Insufficient documentation

## 2016-01-09 DIAGNOSIS — E039 Hypothyroidism, unspecified: Secondary | ICD-10-CM | POA: Insufficient documentation

## 2016-01-09 DIAGNOSIS — Z794 Long term (current) use of insulin: Secondary | ICD-10-CM | POA: Insufficient documentation

## 2016-01-09 DIAGNOSIS — E11319 Type 2 diabetes mellitus with unspecified diabetic retinopathy without macular edema: Secondary | ICD-10-CM | POA: Diagnosis not present

## 2016-01-09 DIAGNOSIS — Z87891 Personal history of nicotine dependence: Secondary | ICD-10-CM | POA: Insufficient documentation

## 2016-01-09 DIAGNOSIS — E1140 Type 2 diabetes mellitus with diabetic neuropathy, unspecified: Secondary | ICD-10-CM | POA: Insufficient documentation

## 2016-01-09 MED ORDER — HYDROCODONE-ACETAMINOPHEN 5-325 MG PO TABS: 1.0000 | ORAL_TABLET | ORAL | 0 refills | Status: AC | PRN

## 2016-01-09 MED ORDER — CLINDAMYCIN HCL 150 MG PO CAPS: 300.0000 mg | ORAL_CAPSULE | Freq: Three times a day (TID) | ORAL | 0 refills | Status: DC

## 2016-01-09 NOTE — ED Notes (Signed)
Pt departed in NAD.  

## 2016-01-09 NOTE — Discharge Instructions (Signed)
Take the prescribed medication as directed. Keep areas clean with soap and warm water. Continue monitoring your blood sugar at home. Follow-up with your primary care doctor later this week for re-check. Return to the ED for new or worsening symptoms.

## 2016-01-09 NOTE — ED Triage Notes (Signed)
Pt. reports multiple pustules at right lateral thigh and bilateral buttocks onset last week with drainage . Denies fever or chills.

## 2016-01-09 NOTE — ED Provider Notes (Signed)
[facility]-EMERGENCY DEPT Provider Note   CSN: [id number] Arrival date & time: 01/09/16  1827  By signing my name below, I, Aggie MoatsJenny Song, attest that this documentation has been prepared under the direction and in the presence of Sharilyn SitesLisa Kennedie Pardoe, PA-C. Electronically signed by: Aggie MoatsJenny Song, ED Scribe. 01/09/16. 7:48 PM.   History   Chief Complaint Chief Complaint  Patient presents with  . Skin Problem    The history is provided by the patient. No language interpreter was used.   HPI Comments:  Charles Montgomery is a 50 y.o. male with a history of DMII, chronic kidney disease and congestive heart failure who presents to the Emergency Department complaining of multiple bumps, which started last week. Localized to right lateral thigh and bilateral buttocks. Associated symptoms include drainage and tenderness with pressure. Denies fever or chills. No new soaps, lotions or detergents. No known allergies.  No treatments tried PTA.  States glucose earlier today was 128 which is normal for him.  Past Medical History:  Diagnosis Date  . Anemia   . Arthritis   . CHF (congestive heart failure) (HCC)   . Cigarette nicotine dependence, uncomplicated   . CKD (chronic kidney disease)    neuropologist is Dr Britt BottomAlvin  . Complication of anesthesia    took more medication "due to sleep apena"  . Coronary artery disease    Non obstructive per cardiac cath 05/10/15  . DCM (dilated cardiomyopathy) (HCC) 04/12/2015  . Depression   . Diabetes mellitus    type  . Diabetic neuropathy (HCC)   . Diabetic retinopathy of left eye (HCC)   . DVT (deep venous thrombosis) (HCC)   . Fatigue   . 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 GERD (gastroesophageal reflux disease)   . High cholesterol   . Hyperlipidemia   . Hypertension   . Hypothyroid    pt denies this  . Leg DVT (deep venous thromboembolism), chronic (HCC)   . Macular edema   . Nephrotic syndrome   . Neuropathy  (HCC)   . Obesity   . OSA (obstructive sleep apnea) 06/26/2015   Severe with AHI 118/hr now on CPAP at 20cm H2O - no longer using cpap, being tested for bipap  . Peripheral neuropathy (HCC)   . Shortness of breath dyspnea    with exertion  . Snores   . Systolic heart failure [facility](HCC)     Patient Active Problem List   Diagnosis Date Noted  . Chronic kidney disease (CKD), stage IV (severe) (HCC) 09/12/2015  . OSA (obstructive sleep apnea) 06/26/2015  . ETOH abuse 05/08/2015  . Elevated ferritin level 05/08/2015  . DCM (dilated cardiomyopathy) (HCC) 04/12/2015  . Chest pain 04/12/2015  . Excessive daytime sleepiness 04/12/2015  . Snoring 04/12/2015  . Bruit 04/12/2015  . GERD (gastroesophageal reflux disease)   . High cholesterol   . Hypothyroid   . Obesity   . Diabetic neuropathy (HCC)   . Hyperlipidemia   . Diabetic retinopathy of left eye (HCC)   . Systolic heart failure (HCC)   . CKD (chronic kidney disease)   . Leg DVT (deep venous thromboembolism), chronic (HCC)   . Fatigue   . Snores   . Cigarette nicotine dependence, uncomplicated   . 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 S/P biopsy   . Proteinuria   . Edema   . SOB (shortness of breath)   . Acute systolic heart failure (HCC)   . Acute renal  failure syndrome (HCC) 12/31/2014  . Nephrotic syndrome 12/31/2014  . Hypoalbuminemia 12/31/2014  . Peripheral edema 12/31/2014  . Diabetes mellitus 12/31/2014  . Hypertension 12/31/2014  . AKI (acute kidney injury) (HCC) 12/31/2014  . AP (abdominal pain)     Past Surgical History:  Procedure Laterality Date  . AV FISTULA PLACEMENT Left 09/22/2015   Procedure: RADIOCEPHALIC ARTERIOVENOUS (AV) FISTULA CREATION;  Surgeon: Pryor Ochoa, MD;  Location: [facility] OR;  Service: Vascular;  Laterality: Left;  . AV FISTULA PLACEMENT Left 10/25/2015   Procedure: CREATION OF LEFT ARM BRACHIOCEPHALIC ARTERIOVENOUS (AV) FISTULA ;  Surgeon: Pryor Ochoa, MD;  Location: [facility] OR;  Service: Vascular;  Laterality: Left;  . BIPAP TITRATION  09/22/2015  . CARDIAC CATHETERIZATION N/A 05/10/2015   Procedure: Left Heart Cath and Coronary Angiography;  Surgeon: Lyn Records, MD;  Location: [facility] INVASIVE CV LAB;  Service: Cardiovascular;  Laterality: N/A;  . WISDOM TOOTH EXTRACTION         Home Medications    Prior to Admission medications   Medication Sig Start Date End Date Taking? Authorizing Provider  apixaban (ELIQUIS) 2.5 MG TABS tablet Take 2.5 mg by mouth 2 (two) times daily.    Historical Provider, MD  buPROPion (WELLBUTRIN XL) 150 MG 24 hr tablet Take 1 tablet by mouth daily. 10/22/15   Historical Provider, MD  carvedilol (COREG) 25 MG tablet Take 1 tablet (25 mg total) by mouth 2 (two) times daily with a meal. 04/12/15   Quintella Reichert, MD  cephALEXin (KEFLEX) 500 MG capsule Take 1 capsule (500 mg total) by mouth 2 (two) times daily. 11/27/15   Carma Lair Nickel, NP  ciclopirox (PENLAC) 8 % solution Apply topically at bedtime. Apply over nail and surrounding skin. Apply daily over previous coat. After seven (7) days, may remove with alcohol and continue cycle. 09/25/15   Vivi Barrack, DPM  fenofibrate (TRICOR) 48 MG tablet Take 48 mg by mouth 2 (two) times daily. Reported on 05/08/2015 04/12/15   Historical Provider, MD  furosemide (LASIX) 80 MG tablet Take 40 mg by mouth 2 (two) times daily.  10/22/15   Historical Provider, MD  HUMALOG KWIKPEN 200 UNIT/ML SOPN Inject 0-20 Units into the skin 3 (three) times daily as needed. Based on sliding scale: If BG is greater than 200, inject 20 units into the skin once If BG is less than 200, DO NOT use any 06/13/15   Historical Provider, MD  hydrALAZINE (APRESOLINE) 50 MG tablet Take 1 tablet (50 mg total) by mouth 3 (three) times daily. 05/12/15   Bhavinkumar Bhagat, PA  insulin detemir (LEVEMIR) 100 UNIT/ML injection Inject 50 Units into the skin 3 (three) times daily.     Historical Provider, MD    NONFORMULARY OR COMPOUNDED ITEM [facility] compound:  Antiinflammatory cream - Diclofenac 3%, Baclofen 2%, Cyclobenzaprine 2%, Lidocaine 2%, dispense 180gram, apply 1-2 grams to affected area 3-4 times daily, +4refills. 09/28/15   Vivi Barrack, DPM  trimethoprim-polymyxin b (POLYTRIM) ophthalmic solution Place 3-4 drops into both eyes See admin instructions. Three times a day on the first day after injection, on the second day four times.  Only use for two days after eye injection once a month.    Historical Provider, MD  urea (GORDONS UREA) 40 % ointment Apply topically as needed. Patient taking differently: Apply 1 application topically daily as needed (dry skin).  09/27/15   Vivi Barrack, DPM    Family History Family History  Problem  Relation Age of Onset  . Leukemia Mother   . Hypertension Father   . Diabetes type II Father   . Deep vein thrombosis Father   . Renal Disease Father     renal transplant  . Healthy Sister   . Healthy Brother     Social History Social History  Substance Use Topics  . Smoking status: Former Smoker    Packs/day: 0.25    Years: 30.00    Types: Cigarettes    Quit date: 05/09/2015  . Smokeless tobacco: Never Used  . Alcohol use No     Allergies   Review of patient's allergies indicates no known allergies.   Review of Systems Review of Systems  Skin: Positive for rash.       Bumps on legs and buttocks.  All other systems reviewed and are negative.    Physical Exam Updated Vital Signs BP (!) 98/54 (BP Location: Left Arm)   Pulse 92   Temp 99.3 F (37.4 C) (Oral)   Resp 16   Ht 5\' 10"  (1.778 m)   Wt (!) 316 lb (143.3 kg)   SpO2 98%   BMI 45.34 kg/m   Physical Exam  Constitutional: He is oriented to person, place, and time. He appears well-developed and well-nourished.  HENT:  Head: Normocephalic and atraumatic.  Mouth/Throat: Oropharynx is clear and moist.  Eyes: Conjunctivae and EOM are normal. Pupils are equal,  round, and reactive to light.  Neck: Normal range of motion.  Cardiovascular: Normal rate, regular rhythm and normal heart sounds.   Pulmonary/Chest: Effort normal and breath sounds normal.  Abdominal: Soft. Bowel sounds are normal.  Musculoskeletal: Normal range of motion.  Neurological: He is alert and oriented to person, place, and time.  Skin: Skin is warm and dry.  Small intact pustule to right lateral thigh as well as multiple open areas to his buttocks with visible crusting; there is no active drainage currently; no surrounding erythema or induration, no warmth to touch No rash  Psychiatric: He has a normal mood and affect.  Nursing note and vitals reviewed.    ED Treatments / Results  DIAGNOSTIC STUDIES:  Oxygen Saturation is 98% on room air, normal by my interpretation.    COORDINATION OF CARE:  7:34 PM Discussed treatment plan with pt at bedside, which includes Clindamycin and hydrocodone for pain, and pt agreed to plan. Advised to follow up with PCP in the next couple of days.  Labs (all labs ordered are listed, but only abnormal results are displayed) Labs Reviewed - No data to display  EKG  EKG Interpretation None       Radiology No results found.  Procedures Procedures (including critical care time)  Medications Ordered in ED Medications - No data to display   Initial Impression / Assessment and Plan / ED Course  I have reviewed the triage vital signs and the nursing notes.  Pertinent labs & imaging results that were available during my care of the patient were reviewed by me and considered in my medical decision making (see chart for details).  Clinical Course   50 year old male here with intact pustule of right lateral thigh and small open areas of skin to his buttocks with crusting. He reports there has been some drainage from these areas previously. He is afebrile and nontoxic. There are no active abscesses at this time that require I&D. There is  no active drainage and no surrounding erythema or induration. No warmth to touch. He denies any changes  in soaps or detergents. Uncertain etiology of these pustules.  Given patient's history of diabetes, feel it would be wise to start him on some antibiotics. States his glucoses morning was 128 which is baseline for him. Patient also has chronic kidney disease with the most recent creatinine at 3.71 and GFR 21. After discussion with attending physician and pharmacist, it was recommended to place him on clindamycin for better coverage without renal adjustments.  Discussed home wound care. He will follow-up with his primary care doctor later this week for recheck. Encouraged to monitor his home glucose closely.  Discussed plan with patient, he/she acknowledged understanding and agreed with plan of care.  Return precautions given for new or worsening symptoms.  Final Clinical Impressions(s) / ED Diagnoses   Final diagnoses:  Skin pustule    New Prescriptions New Prescriptions   CLINDAMYCIN (CLEOCIN) 150 MG CAPSULE    Take 2 capsules (300 mg total) by mouth 3 (three) times daily. May dispense as 150mg  capsules   HYDROCODONE-ACETAMINOPHEN (NORCO/VICODIN) 5-325 MG TABLET    Take 1 tablet by mouth every 4 (four) hours as needed.   I personally performed the services described in this documentation, which was scribed in my presence. The recorded information has been reviewed and is accurate.    Garlon Hatchet, PA-C 01/09/16 1959    Derwood Kaplan, MD 01/10/16 [phone]

## 2016-01-12 ENCOUNTER — Other Ambulatory Visit (INDEPENDENT_AMBULATORY_CARE_PROVIDER_SITE_OTHER): Payer: BLUE CROSS/BLUE SHIELD | Admitting: Ophthalmology

## 2016-01-12 DIAGNOSIS — E10311 Type 1 diabetes mellitus with unspecified diabetic retinopathy with macular edema: Secondary | ICD-10-CM

## 2016-01-12 DIAGNOSIS — E103311 Type 1 diabetes mellitus with moderate nonproliferative diabetic retinopathy with macular edema, right eye: Secondary | ICD-10-CM

## 2016-01-16 ENCOUNTER — Other Ambulatory Visit: Payer: Self-pay | Admitting: Podiatry

## 2016-02-01 ENCOUNTER — Encounter: Payer: Self-pay | Admitting: *Deleted

## 2016-02-02 ENCOUNTER — Encounter (INDEPENDENT_AMBULATORY_CARE_PROVIDER_SITE_OTHER): Payer: BLUE CROSS/BLUE SHIELD | Admitting: Ophthalmology

## 2016-02-02 DIAGNOSIS — I10 Essential (primary) hypertension: Secondary | ICD-10-CM

## 2016-02-02 DIAGNOSIS — E10311 Type 1 diabetes mellitus with unspecified diabetic retinopathy with macular edema: Secondary | ICD-10-CM

## 2016-02-02 DIAGNOSIS — E103313 Type 1 diabetes mellitus with moderate nonproliferative diabetic retinopathy with macular edema, bilateral: Secondary | ICD-10-CM

## 2016-02-02 DIAGNOSIS — H43813 Vitreous degeneration, bilateral: Secondary | ICD-10-CM

## 2016-02-02 DIAGNOSIS — H35033 Hypertensive retinopathy, bilateral: Secondary | ICD-10-CM | POA: Diagnosis not present

## 2016-02-06 ENCOUNTER — Encounter: Payer: Self-pay | Admitting: Cardiology

## 2016-02-18 ENCOUNTER — Encounter: Payer: Self-pay | Admitting: Cardiology

## 2016-02-19 ENCOUNTER — Ambulatory Visit (INDEPENDENT_AMBULATORY_CARE_PROVIDER_SITE_OTHER): Payer: BLUE CROSS/BLUE SHIELD | Admitting: Cardiology

## 2016-02-19 ENCOUNTER — Encounter: Payer: Self-pay | Admitting: Cardiology

## 2016-02-19 VITALS — BP 116/70 | HR 73 | Ht 70.0 in | Wt 322.0 lb

## 2016-02-19 DIAGNOSIS — I420 Dilated cardiomyopathy: Secondary | ICD-10-CM

## 2016-02-19 DIAGNOSIS — G4733 Obstructive sleep apnea (adult) (pediatric): Secondary | ICD-10-CM

## 2016-02-19 DIAGNOSIS — I2510 Atherosclerotic heart disease of native coronary artery without angina pectoris: Secondary | ICD-10-CM

## 2016-02-19 DIAGNOSIS — I10 Essential (primary) hypertension: Secondary | ICD-10-CM

## 2016-02-19 DIAGNOSIS — I5022 Chronic systolic (congestive) heart failure: Secondary | ICD-10-CM | POA: Diagnosis not present

## 2016-02-19 HISTORY — DX: Atherosclerotic heart disease of native coronary artery without angina pectoris: I25.10

## 2016-02-19 NOTE — Patient Instructions (Signed)
Medication Instructions:  Your physician recommends that you continue on your current medications as directed. Please refer to the Current Medication list given to you today.   Labwork: TODAY: BMET  Your physician recommends that you return for FASTING lab work.  Testing/Procedures: None  Follow-Up: Your physician wants you to follow-up in: 6 months with Dr. Norris Cross assistant. You will receive a reminder letter in the mail two months in advance. If you don't receive a letter, please call our office to schedule the follow-up appointment.   Your physician wants you to follow-up in: 1 year with Dr. Mayford Knife. You will receive a reminder letter in the mail two months in advance. If you don't receive a letter, please call our office to schedule the follow-up appointment.   Any Other Special Instructions Will Be Listed Below (If Applicable).     If you need a refill on your cardiac medications before your next appointment, please call your pharmacy.

## 2016-02-19 NOTE — Progress Notes (Signed)
Cardiology Office Note    Date:  02/19/2016   ID:  Charles Montgomery, DOB Sep 29, 1965, MRN [id number]  PCP:  Georgann Housekeeper, MD  Cardiologist:  Armanda Magic, MD   Chief Complaint  Patient presents with  . Congestive Heart Failure  . Hypertension  . Sleep Apnea    History of Present Illness:  Charles Montgomery is a 50 y.o. male  who presents for followup of CHF.  He has a history of type II DM, GERD, hypothyroidism, HTN, tobacco use, dyslipidemia, DVT and chronic systolic CHF with EF 20-25%.  He has CKD with focal glomerulonephritis and nephrotic syndrome. He is not on an ACE I due to underlying CKD.  When he was last seen in the office he complained of chest pain and nuclear stress test showed no ischemia but EF 37%.  The patient underwent cath showing non obstructive ASCAD with 25% LAD and 15% ramus.  He subsequently underwent PSG for daytime sleepiness and was found to have severe OSA primarily central in origin with an AHI of 118/hr and underwent BiPAP auto titration.  He is now here for followup.  He is doing well.  He denies any chest pain, LE edema, dizziness (except getting up too fast) palpitations or syncope. He still has some DOE with walking.  He is doing fairly well with his BIPAP.  He tolerates the nasal mask and feels the pressure is good on auto.  He feels much more rested in the am and has no daytime sleepiness.  He does not think he snores.    Past Medical History:  Diagnosis Date  . Anemia   . Arthritis   . CAD (coronary artery disease), native coronary artery 02/19/2016   Nonobstructive by cath 2017  . Cigarette nicotine dependence, uncomplicated   . CKD (chronic kidney disease)    neuropologist is Dr Britt Bottom  . Complication of anesthesia    took more medication "due to sleep apena"  . DCM (dilated cardiomyopathy) (HCC) 04/12/2015   hypertensive.  Initial EF 20-25% by echo 03/2016 and normalized on echo 05/2015 with EF 60-65%.  . Depression   . Diabetes mellitus    type  . Diabetic neuropathy (HCC)   . Diabetic retinopathy of left eye (HCC)   . Fatigue   . 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 GERD (gastroesophageal reflux disease)   . Hyperlipidemia   . Hypertension   . Hypothyroid    pt denies this  . Leg DVT (deep venous thromboembolism), chronic (HCC)   . Macular edema   . Nephrotic syndrome   . Neuropathy (HCC)   . Obesity   . OSA (obstructive sleep apnea) 06/26/2015   Severe with AHI 118/hr now on CPAP at 20cm H2O - no longer using cpap, being tested for bipap  . Peripheral neuropathy (HCC)   . Shortness of breath dyspnea    with exertion  . Snores     Past Surgical History:  Procedure Laterality Date  . AV FISTULA PLACEMENT Left 09/22/2015   Procedure: RADIOCEPHALIC ARTERIOVENOUS (AV) FISTULA CREATION;  Surgeon: Pryor Ochoa, MD;  Location: [facility] OR;  Service: Vascular;  Laterality: Left;  . AV FISTULA PLACEMENT Left 10/25/2015   Procedure: CREATION OF LEFT ARM BRACHIOCEPHALIC ARTERIOVENOUS (AV) FISTULA ;  Surgeon: Pryor Ochoa, MD;  Location: [facility] OR;  Service: Vascular;  Laterality: Left;  . BIPAP TITRATION  09/22/2015  . CARDIAC CATHETERIZATION N/A 05/10/2015   Procedure: Left Heart Cath and Coronary Angiography;  Surgeon: Lyn Records, MD;  Location[facility] INVASIVE CV LAB;  Service: Cardiovascular;  Laterality: N/A;  . WISDOM TOOTH EXTRACTION      Current Medications: Outpatient Medications Prior to Visit  Medication Sig Dispense Refill  . apixaban (ELIQUIS) 2.5 MG TABS tablet Take 2.5 mg by mouth 2 (two) times daily.    Marland Kitchen buPROPion (WELLBUTRIN XL) 150 MG 24 hr tablet Take 1 tablet by mouth daily.    . carvedilol (COREG) 25 MG tablet Take 1 tablet (25 mg total) by mouth 2 (two) times daily with a meal. 60 tablet 11  . ciclopirox (PENLAC) 8 % solution Apply topically at bedtime. Apply over nail and surrounding skin. Apply daily over previous coat. After seven (7) days, may remove with alcohol  and continue cycle. 6.6 mL 4  . fenofibrate (TRICOR) 48 MG tablet Take 48 mg by mouth 2 (two) times daily. Reported on 05/08/2015    . furosemide (LASIX) 80 MG tablet Take 40 mg by mouth 2 (two) times daily.     Marland Kitchen GORDONS UREA 40 % ointment APPLY TOPICALLY AS NEEDED 30 g 3  . HUMALOG KWIKPEN 200 UNIT/ML SOPN Inject 0-20 Units into the skin 3 (three) times daily as needed. Based on sliding scale: If BG is greater than 200, inject 20 units into the skin once If BG is less than 200, DO NOT use any    . hydrALAZINE (APRESOLINE) 50 MG tablet Take 1 tablet (50 mg total) by mouth 3 (three) times daily. 90 tablet 6  . HYDROcodone-acetaminophen (NORCO/VICODIN) 5-325 MG tablet Take 1 tablet by mouth every 4 (four) hours as needed. 6 tablet 0  . insulin detemir (LEVEMIR) 100 UNIT/ML injection Inject 50 Units into the skin 3 (three) times daily.     . NONFORMULARY OR COMPOUNDED ITEM [facility] compound:  Antiinflammatory cream - Diclofenac 3%, Baclofen 2%, Cyclobenzaprine 2%, Lidocaine 2%, dispense 180gram, apply 1-2 grams to affected area 3-4 times daily, +4refills. 180 each 4  . trimethoprim-polymyxin b (POLYTRIM) ophthalmic solution Place 3-4 drops into both eyes See admin instructions. Three times a day on the first day after injection, on the second day four times.  Only use for two days after eye injection once a month.    . cephALEXin (KEFLEX) 500 MG capsule Take 1 capsule (500 mg total) by mouth 2 (two) times daily. 28 capsule 0  . clindamycin (CLEOCIN) 150 MG capsule Take 2 capsules (300 mg total) by mouth 3 (three) times daily. May dispense as 150mg  capsules 60 capsule 0   No facility-administered medications prior to visit.      Allergies:   Review of patient's allergies indicates no known allergies.   Social History   Social History  . Marital status: Married    Spouse name: N/A  . Number of children: N/A  . Years of education: N/A   Social History Main Topics  . Smoking status:  Former Smoker    Packs/day: 0.25    Years: 30.00    Types: Cigarettes    Quit date: 05/09/2015  . Smokeless tobacco: Never Used  . Alcohol use No  . Drug use: No  . Sexual activity: Not Asked   Other Topics Concern  . None   Social History Narrative  . None     Family History:  The patient's family history includes Deep vein thrombosis in his father; Diabetes type II in his father; Healthy in his brother and sister; Hypertension in his father; Leukemia in his mother;  Renal Disease in his father.   ROS:   Please see the history of present illness.    ROS All other systems reviewed and are negative.  No flowsheet data found.     PHYSICAL EXAM:   VS:  BP 116/70   Pulse 73   Ht 5\' 10"  (1.778 m)   Wt (!) 322 lb (146.1 kg)   BMI 46.20 kg/m    GEN: Well nourished, well developed, in no acute distress  HEENT: normal  Neck: no JVD, carotid bruits, or masses Cardiac: RRR; no murmurs, rubs, or gallops,no edema.  Intact distal pulses bilaterally.  Respiratory:  clear to auscultation bilaterally, normal work of breathing GI: soft, nontender, nondistended, + BS MS: no deformity or atrophy  Skin: warm and dry, no rash Neuro:  Alert and Oriented x 3, Strength and sensation are intact Psych: euthymic mood, full affect  Wt Readings from Last 3 Encounters:  02/19/16 (!) 322 lb (146.1 kg)  01/09/16 (!) 316 lb (143.3 kg)  12/05/15 (!) 313 lb (142 kg)      Studies/Labs Reviewed:   EKG:  EKG is ordered today.  The ekg ordered today demonstrates   Recent Labs: 05/08/2015: ALT 14; ALT 12; B Natriuretic Peptide 16.6 05/09/2015: Magnesium 2.2 05/12/2015: BUN 30; Creatinine, Ser 3.71; Platelets 242 10/25/2015: Hemoglobin 12.2; Potassium 3.9; Sodium 142   Lipid Panel No results found for: CHOL, TRIG, HDL, CHOLHDL, VLDL, LDLCALC, LDLDIRECT  Additional studies/ records that were reviewed today include:  none    ASSESSMENT:    1. Chronic systolic heart failure (HCC)   2.  Essential hypertension   3. DCM (dilated cardiomyopathy) (HCC)   4. OSA (obstructive sleep apnea)   5. Coronary artery disease involving native coronary artery of native heart without angina pectoris      PLAN:  In order of problems listed above:  1.  Chronic systolic CHF with hypertensive DCM.  He appears euvolemic on exam.  Continue BB/ hyralazine/diuretic.  No ACE I or ARB due to CKD.   2.  HTN - BP controlled on current meds. 3.  DCM secondary to poorly controlled HTN.  EF 20-25% now normalized at 60-65% by echo 05/2015..   4.  Chronic DVT on eliquis. 5.  OSA - the patient is tolerating PAP therapy well without any problems. The PAP download was reviewed today and showed an AHI of 6.7/hr on auto BiPap with 50% compliance in using more than 4 hours nightly.  The patient has been using and benefiting from CPAP use and will continue to benefit from therapy.   I have encouraged him to get into a routine exercise program and also cut back on carbs and portions.  I encouraged improved compliance. 6.   Nonobstructive ASCAD with 20% distal LAD and 15% ramus by cath.  Continue statin.  No ASA due to DOAC. 7.  Hyperlipidemia with LDL goal < 70.  Continue Tricor.  Check FLP and ALT.    Medication Adjustments/Labs and Tests Ordered: Current medicines are reviewed at length with the patient today.  Concerns regarding medicines are outlined above.  Medication changes, Labs and Tests ordered today are listed in the Patient Instructions below.  There are no Patient Instructions on file for this visit.   Signed, Armanda Magicraci Nolan Tuazon, MD  02/19/2016 1:54 PM    [facility] [address], [place], [place]  [address] Phone: [phone]; Fax: [phone]

## 2016-02-20 ENCOUNTER — Other Ambulatory Visit: Payer: BLUE CROSS/BLUE SHIELD | Admitting: *Deleted

## 2016-02-20 DIAGNOSIS — I2510 Atherosclerotic heart disease of native coronary artery without angina pectoris: Secondary | ICD-10-CM

## 2016-02-20 DIAGNOSIS — I5022 Chronic systolic (congestive) heart failure: Secondary | ICD-10-CM

## 2016-02-20 DIAGNOSIS — I10 Essential (primary) hypertension: Secondary | ICD-10-CM

## 2016-02-20 LAB — BASIC METABOLIC PANEL
BUN: 47 mg/dL — AB (ref 7–25)
CALCIUM: 9.3 mg/dL (ref 8.6–10.3)
CHLORIDE: 107 mmol/L (ref 98–110)
CO2: 18 mmol/L — AB (ref 20–31)
Creat: 4.71 mg/dL — ABNORMAL HIGH (ref 0.60–1.35)
GLUCOSE: 218 mg/dL — AB (ref 65–99)
Potassium: 4.8 mmol/L (ref 3.5–5.3)
Sodium: 138 mmol/L (ref 135–146)

## 2016-02-20 LAB — HEPATIC FUNCTION PANEL
ALK PHOS: 78 U/L (ref 40–115)
ALT: 16 U/L (ref 9–46)
AST: 15 U/L (ref 10–40)
Albumin: 3.9 g/dL (ref 3.6–5.1)
BILIRUBIN INDIRECT: 0.4 mg/dL (ref 0.2–1.2)
BILIRUBIN TOTAL: 0.4 mg/dL (ref 0.2–1.2)
Bilirubin, Direct: 0 mg/dL (ref <=0.2)
Total Protein: 7.8 g/dL (ref 6.1–8.1)

## 2016-02-20 LAB — LIPID PANEL
Cholesterol: 208 mg/dL — ABNORMAL HIGH (ref 125–200)
HDL: 26 mg/dL — AB (ref >=40)
LDL CALC: 132 mg/dL — AB (ref <130)
Total CHOL/HDL Ratio: 8 Ratio — ABNORMAL HIGH (ref <=5.0)
Triglycerides: 252 mg/dL — ABNORMAL HIGH (ref <150)
VLDL: 50 mg/dL — ABNORMAL HIGH (ref <30)

## 2016-02-22 ENCOUNTER — Telehealth: Payer: Self-pay

## 2016-02-22 DIAGNOSIS — E785 Hyperlipidemia, unspecified: Secondary | ICD-10-CM

## 2016-02-22 MED ORDER — ROSUVASTATIN CALCIUM 20 MG PO TABS: 20.0000 mg | ORAL_TABLET | Freq: Every day | ORAL | 11 refills | Status: AC

## 2016-02-22 NOTE — Telephone Encounter (Signed)
-----   Message from Quintella Reichert, MD sent at 02/21/2016  1:05 PM EDT ----- LDL too high as well as TAGs.  Add Crestor 20mg  daily and repeat FLP and ALT in 6 weeks.  Renal function is worse. Please get him in with his nephrologist and if he has not see one needs appt ASAP this week.

## 2016-02-22 NOTE — Telephone Encounter (Signed)
Informed patient of results and verbal understanding expressed.  Instructed patient to START CRESTOR 20 mg daily. FLP and ALT scheduled 11/20. Patient sees nephrology - Dr. Lowell Guitar at [facility]. Results forwarded to [facility] for follow-up. Patient agrees with treatment plan.

## 2016-03-01 ENCOUNTER — Encounter (INDEPENDENT_AMBULATORY_CARE_PROVIDER_SITE_OTHER): Payer: BLUE CROSS/BLUE SHIELD | Admitting: Ophthalmology

## 2016-03-06 ENCOUNTER — Encounter (INDEPENDENT_AMBULATORY_CARE_PROVIDER_SITE_OTHER): Payer: BLUE CROSS/BLUE SHIELD | Admitting: Ophthalmology

## 2016-03-06 DIAGNOSIS — H43813 Vitreous degeneration, bilateral: Secondary | ICD-10-CM | POA: Diagnosis not present

## 2016-03-06 DIAGNOSIS — I10 Essential (primary) hypertension: Secondary | ICD-10-CM

## 2016-03-06 DIAGNOSIS — H35033 Hypertensive retinopathy, bilateral: Secondary | ICD-10-CM | POA: Diagnosis not present

## 2016-03-06 DIAGNOSIS — H2513 Age-related nuclear cataract, bilateral: Secondary | ICD-10-CM

## 2016-03-06 DIAGNOSIS — E11311 Type 2 diabetes mellitus with unspecified diabetic retinopathy with macular edema: Secondary | ICD-10-CM

## 2016-03-06 DIAGNOSIS — E113313 Type 2 diabetes mellitus with moderate nonproliferative diabetic retinopathy with macular edema, bilateral: Secondary | ICD-10-CM | POA: Diagnosis not present

## 2016-03-29 ENCOUNTER — Ambulatory Visit (INDEPENDENT_AMBULATORY_CARE_PROVIDER_SITE_OTHER): Payer: BLUE CROSS/BLUE SHIELD | Admitting: Podiatry

## 2016-03-29 DIAGNOSIS — B351 Tinea unguium: Secondary | ICD-10-CM | POA: Diagnosis not present

## 2016-03-29 DIAGNOSIS — E0849 Diabetes mellitus due to underlying condition with other diabetic neurological complication: Secondary | ICD-10-CM

## 2016-03-29 DIAGNOSIS — M79676 Pain in unspecified toe(s): Secondary | ICD-10-CM

## 2016-03-29 DIAGNOSIS — M2010 Hallux valgus (acquired), unspecified foot: Secondary | ICD-10-CM

## 2016-03-29 DIAGNOSIS — M2142 Flat foot [pes planus] (acquired), left foot: Secondary | ICD-10-CM

## 2016-03-29 DIAGNOSIS — M2141 Flat foot [pes planus] (acquired), right foot: Secondary | ICD-10-CM

## 2016-03-29 NOTE — Progress Notes (Signed)
Complaint:  Visit Type: Patient returns to my office for continued preventative foot care services. Complaint: Patient states" my nails have grown long and thick and become painful to walk and wear shoes" Patient has been diagnosed with DM with neuropathy. The patient presents for preventative foot care services. No changes to ROS  Podiatric Exam: Vascular: dorsalis pedis and posterior tibial pulses are palpable bilateral. Capillary return is immediate. Temperature gradient is WNL. Skin turgor WNL  Sensorium: Diminished  Semmes Weinstein monofilament test. Normal tactile sensation bilaterally. Nail Exam: Pt has thick disfigured discolored nails with subungual debris noted bilateral entire nail hallux through fifth toenails Ulcer Exam: There is no evidence of ulcer or pre-ulcerative changes or infection. Orthopedic Exam: Muscle tone and strength are WNL. No limitations in general ROM. No crepitus or effusions noted. 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 No infection or ulcers  Diagnosis:  Onychomycosis, , Pain in right toe, pain in left toes  Treatment & Plan Procedures and Treatment: Consent by patient was obtained for treatment procedures. The patient understood the discussion of treatment and procedures well. All questions were answered thoroughly reviewed. Debridement of mycotic and hypertrophic toenails, 1 through 5 bilateral and clearing of subungual debris. No ulceration, no infection noted. Discussed nail surgery for hallux nails.  Told him to check with medical doctor.  Also initiate diabetic shoe for DPN and bunions and hammer toes. Return Visit-Office Procedure: Patient instructed to return to the office for a follow up visit 3 months for continued evaluation and treatment.    Helane Gunther DPM

## 2016-04-05 ENCOUNTER — Encounter (INDEPENDENT_AMBULATORY_CARE_PROVIDER_SITE_OTHER): Payer: BLUE CROSS/BLUE SHIELD | Admitting: Ophthalmology

## 2016-04-05 DIAGNOSIS — I10 Essential (primary) hypertension: Secondary | ICD-10-CM | POA: Diagnosis not present

## 2016-04-05 DIAGNOSIS — E113313 Type 2 diabetes mellitus with moderate nonproliferative diabetic retinopathy with macular edema, bilateral: Secondary | ICD-10-CM | POA: Diagnosis not present

## 2016-04-05 DIAGNOSIS — H35033 Hypertensive retinopathy, bilateral: Secondary | ICD-10-CM

## 2016-04-05 DIAGNOSIS — H2513 Age-related nuclear cataract, bilateral: Secondary | ICD-10-CM | POA: Diagnosis not present

## 2016-04-05 DIAGNOSIS — E11311 Type 2 diabetes mellitus with unspecified diabetic retinopathy with macular edema: Secondary | ICD-10-CM

## 2016-04-05 DIAGNOSIS — H43813 Vitreous degeneration, bilateral: Secondary | ICD-10-CM | POA: Diagnosis not present

## 2016-04-08 ENCOUNTER — Other Ambulatory Visit: Payer: BLUE CROSS/BLUE SHIELD | Admitting: *Deleted

## 2016-04-08 DIAGNOSIS — E785 Hyperlipidemia, unspecified: Secondary | ICD-10-CM

## 2016-04-08 LAB — LIPID PANEL
CHOL/HDL RATIO: 5 ratio — AB (ref <5.0)
CHOLESTEROL: 119 mg/dL (ref <200)
HDL: 24 mg/dL — ABNORMAL LOW (ref >40)
LDL Cholesterol: 68 mg/dL (ref <100)
Triglycerides: 134 mg/dL (ref <150)
VLDL: 27 mg/dL (ref <30)

## 2016-04-08 LAB — ALT: ALT: 14 U/L (ref 9–46)

## 2016-04-15 ENCOUNTER — Other Ambulatory Visit: Payer: Self-pay | Admitting: Cardiology

## 2016-04-15 DIAGNOSIS — E785 Hyperlipidemia, unspecified: Secondary | ICD-10-CM

## 2016-05-03 ENCOUNTER — Encounter (INDEPENDENT_AMBULATORY_CARE_PROVIDER_SITE_OTHER): Payer: Medicaid Other | Admitting: Ophthalmology

## 2016-05-03 DIAGNOSIS — E11311 Type 2 diabetes mellitus with unspecified diabetic retinopathy with macular edema: Secondary | ICD-10-CM | POA: Diagnosis not present

## 2016-05-03 DIAGNOSIS — I10 Essential (primary) hypertension: Secondary | ICD-10-CM | POA: Diagnosis not present

## 2016-05-03 DIAGNOSIS — H35033 Hypertensive retinopathy, bilateral: Secondary | ICD-10-CM | POA: Diagnosis not present

## 2016-05-03 DIAGNOSIS — H43813 Vitreous degeneration, bilateral: Secondary | ICD-10-CM

## 2016-05-03 DIAGNOSIS — E113313 Type 2 diabetes mellitus with moderate nonproliferative diabetic retinopathy with macular edema, bilateral: Secondary | ICD-10-CM

## 2016-05-07 ENCOUNTER — Emergency Department (HOSPITAL_COMMUNITY)
Admission: EM | Admit: 2016-05-07 | Discharge: 2016-05-07 | Disposition: A | Discharge status: Home / Self Care | Payer: Medicaid Other | Attending: Emergency Medicine | Admitting: Emergency Medicine

## 2016-05-07 ENCOUNTER — Encounter (HOSPITAL_COMMUNITY): Payer: Self-pay | Admitting: Emergency Medicine

## 2016-05-07 DIAGNOSIS — L0291 Cutaneous abscess, unspecified: Secondary | ICD-10-CM

## 2016-05-07 DIAGNOSIS — E11319 Type 2 diabetes mellitus with unspecified diabetic retinopathy without macular edema: Secondary | ICD-10-CM | POA: Diagnosis not present

## 2016-05-07 DIAGNOSIS — Z7901 Long term (current) use of anticoagulants: Secondary | ICD-10-CM | POA: Insufficient documentation

## 2016-05-07 DIAGNOSIS — E1140 Type 2 diabetes mellitus with diabetic neuropathy, unspecified: Secondary | ICD-10-CM | POA: Insufficient documentation

## 2016-05-07 DIAGNOSIS — L02412 Cutaneous abscess of left axilla: Secondary | ICD-10-CM | POA: Insufficient documentation

## 2016-05-07 DIAGNOSIS — Z87891 Personal history of nicotine dependence: Secondary | ICD-10-CM | POA: Insufficient documentation

## 2016-05-07 DIAGNOSIS — Z794 Long term (current) use of insulin: Secondary | ICD-10-CM | POA: Diagnosis not present

## 2016-05-07 DIAGNOSIS — N184 Chronic kidney disease, stage 4 (severe): Secondary | ICD-10-CM | POA: Insufficient documentation

## 2016-05-07 DIAGNOSIS — I2510 Atherosclerotic heart disease of native coronary artery without angina pectoris: Secondary | ICD-10-CM | POA: Insufficient documentation

## 2016-05-07 DIAGNOSIS — E039 Hypothyroidism, unspecified: Secondary | ICD-10-CM | POA: Diagnosis not present

## 2016-05-07 DIAGNOSIS — E1122 Type 2 diabetes mellitus with diabetic chronic kidney disease: Secondary | ICD-10-CM | POA: Insufficient documentation

## 2016-05-07 DIAGNOSIS — I129 Hypertensive chronic kidney disease with stage 1 through stage 4 chronic kidney disease, or unspecified chronic kidney disease: Secondary | ICD-10-CM | POA: Insufficient documentation

## 2016-05-07 DIAGNOSIS — R21 Rash and other nonspecific skin eruption: Secondary | ICD-10-CM | POA: Diagnosis present

## 2016-05-07 MED ORDER — DOXYCYCLINE HYCLATE 100 MG PO CAPS: 100.0000 mg | ORAL_CAPSULE | Freq: Two times a day (BID) | ORAL | 0 refills | Status: AC

## 2016-05-07 NOTE — ED Notes (Signed)
ED Provider at bedside. 

## 2016-05-07 NOTE — ED Provider Notes (Signed)
[facility]-EMERGENCY DEPT Provider Note   CSN: [id number] Arrival date & time: 05/07/16  1310  By signing my name below, I, Freida Busman, attest that this documentation has been prepared under the direction and in the presence of Arthor Captain, PA-C. Electronically Signed: Freida Busman, Scribe. 05/07/2016. 2:53 PM.  History   Chief Complaint Chief Complaint  Patient presents with  . Recurrent Skin Infections    The history is provided by the patient. No language interpreter was used.    HPI Comments:  Charles Montgomery is a 50 y.o. male with a history of renal disease and DM, who presents to the Emergency Department complaining of a moderatly painful  boil to the left underarm x a few days. He has a h/o same frequently. No alleviating factors noted. No fever. Pt's blood sugar is not very controlled.   Past Medical History:  Diagnosis Date  . Anemia   . Arthritis   . CAD (coronary artery disease), native coronary artery 02/19/2016   Nonobstructive by cath 2017  . Cigarette nicotine dependence, uncomplicated   . CKD (chronic kidney disease)    neuropologist is Dr Britt Bottom  . Complication of anesthesia    took more medication "due to sleep apena"  . DCM (dilated cardiomyopathy) (HCC) 04/12/2015   hypertensive.  Initial EF 20-25% by echo 03/2016 and normalized on echo 05/2015 with EF 60-65%.  . Depression   . Diabetes mellitus    type  . Diabetic neuropathy (HCC)   . Diabetic retinopathy of left eye (HCC)   . Fatigue   . 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 GERD (gastroesophageal reflux disease)   . Hyperlipidemia   . Hypertension   . Hypothyroid    pt denies this  . Leg DVT (deep venous thromboembolism), chronic (HCC)   . Macular edema   . Nephrotic syndrome   . Neuropathy (HCC)   . Obesity   . OSA (obstructive sleep apnea) 06/26/2015   Severe with AHI 118/hr now on CPAP at 20cm H2O - no longer using cpap, being tested for bipap    . Peripheral neuropathy (HCC)   . Shortness of breath dyspnea    with exertion  . Snores     Patient Active Problem List   Diagnosis Date Noted  . CAD (coronary artery disease), native coronary artery 02/19/2016  . Chronic kidney disease (CKD), stage IV (severe) (HCC) 09/12/2015  . OSA (obstructive sleep apnea) 06/26/2015  . ETOH abuse 05/08/2015  . Elevated ferritin level 05/08/2015  . DCM (dilated cardiomyopathy) (HCC) 04/12/2015  . Chest pain 04/12/2015  . Excessive daytime sleepiness 04/12/2015  . Snoring 04/12/2015  . Bruit 04/12/2015  . GERD (gastroesophageal reflux disease)   . High cholesterol   . Hypothyroid   . Obesity   . Diabetic neuropathy (HCC)   . Hyperlipidemia   . Diabetic retinopathy of left eye (HCC)   . Chronic systolic heart failure (HCC)   . CKD (chronic kidney disease)   . Leg DVT (deep venous thromboembolism), chronic (HCC)   . Fatigue   . Snores   . Cigarette nicotine dependence, uncomplicated   . 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 S/P biopsy   . Proteinuria   . Edema   . SOB (shortness of breath)   . Acute renal failure syndrome (HCC) 12/31/2014  . Nephrotic syndrome 12/31/2014  . Hypoalbuminemia 12/31/2014  . Peripheral edema 12/31/2014  . Diabetes mellitus 12/31/2014  . Hypertension 12/31/2014  .  AKI (acute kidney injury) (HCC) 12/31/2014  . AP (abdominal pain)     Past Surgical History:  Procedure Laterality Date  . AV FISTULA PLACEMENT Left 09/22/2015   Procedure: RADIOCEPHALIC ARTERIOVENOUS (AV) FISTULA CREATION;  Surgeon: Pryor Ochoa, MD;  Location: [facility] OR;  Service: Vascular;  Laterality: Left;  . AV FISTULA PLACEMENT Left 10/25/2015   Procedure: CREATION OF LEFT ARM BRACHIOCEPHALIC ARTERIOVENOUS (AV) FISTULA ;  Surgeon: Pryor Ochoa, MD;  Location: [facility] OR;  Service: Vascular;  Laterality: Left;  . BIPAP TITRATION  09/22/2015  . CARDIAC CATHETERIZATION N/A 05/10/2015   Procedure: Left  Heart Cath and Coronary Angiography;  Surgeon: Lyn Records, MD;  Location: [facility] INVASIVE CV LAB;  Service: Cardiovascular;  Laterality: N/A;  . WISDOM TOOTH EXTRACTION         Home Medications    Prior to Admission medications   Medication Sig Start Date End Date Taking? Authorizing Provider  apixaban (ELIQUIS) 2.5 MG TABS tablet Take 2.5 mg by mouth 2 (two) times daily.    Historical Provider, MD  buPROPion (WELLBUTRIN XL) 150 MG 24 hr tablet Take 1 tablet by mouth daily. 10/22/15   Historical Provider, MD  carvedilol (COREG) 25 MG tablet TAKE ONE TABLET BY MOUTH TWICE DAILY WITH  A  MEAL 04/15/16   Quintella Reichert, MD  ciclopirox (PENLAC) 8 % solution Apply topically at bedtime. Apply over nail and surrounding skin. Apply daily over previous coat. After seven (7) days, may remove with alcohol and continue cycle. 09/25/15   Vivi Barrack, DPM  fenofibrate (TRICOR) 48 MG tablet Take 48 mg by mouth 2 (two) times daily. Reported on 05/08/2015 04/12/15   Historical Provider, MD  furosemide (LASIX) 80 MG tablet Take 40 mg by mouth 2 (two) times daily.  10/22/15   Historical Provider, MD  GORDONS UREA 40 % ointment APPLY TOPICALLY AS NEEDED 01/17/16   Vivi Barrack, DPM  HUMALOG KWIKPEN 200 UNIT/ML SOPN Inject 0-20 Units into the skin 3 (three) times daily as needed. Based on sliding scale: If BG is greater than 200, inject 20 units into the skin once If BG is less than 200, DO NOT use any 06/13/15   Historical Provider, MD  hydrALAZINE (APRESOLINE) 50 MG tablet Take 1 tablet (50 mg total) by mouth 3 (three) times daily. 05/12/15   Bhavinkumar Bhagat, PA  HYDROcodone-acetaminophen (NORCO/VICODIN) 5-325 MG tablet Take 1 tablet by mouth every 4 (four) hours as needed. 01/09/16   Garlon Hatchet, PA-C  insulin detemir (LEVEMIR) 100 UNIT/ML injection Inject 50 Units into the skin 3 (three) times daily.     Historical Provider, MD  NONFORMULARY OR COMPOUNDED ITEM [facility] compound:   Antiinflammatory cream - Diclofenac 3%, Baclofen 2%, Cyclobenzaprine 2%, Lidocaine 2%, dispense 180gram, apply 1-2 grams to affected area 3-4 times daily, +4refills. 09/28/15   Vivi Barrack, DPM  rosuvastatin (CRESTOR) 20 MG tablet Take 1 tablet (20 mg total) by mouth daily. 02/22/16 02/16/17  Quintella Reichert, MD  trimethoprim-polymyxin b (POLYTRIM) ophthalmic solution Place 3-4 drops into both eyes See admin instructions. Three times a day on the first day after injection, on the second day four times.  Only use for two days after eye injection once a month.    Historical Provider, MD    Family History Family History  Problem Relation Age of Onset  . Leukemia Mother   . Hypertension Father   . Diabetes type II Father   . Deep vein thrombosis  Father   . Renal Disease Father     renal transplant  . Healthy Sister   . Healthy Brother     Social History Social History  Substance Use Topics  . Smoking status: Former Smoker    Packs/day: 0.25    Years: 30.00    Types: Cigarettes    Quit date: 05/09/2015  . Smokeless tobacco: Never Used  . Alcohol use No     Allergies   Patient has no known allergies.   Review of Systems Review of Systems  Constitutional: Negative for fever.  Skin: Positive for rash.     Physical Exam Updated Vital Signs BP 125/72   Pulse 97   Temp 98.7 F (37.1 C) (Oral)   Resp 20   Ht 5\' 10"  (1.778 m)   Wt (!) 320 lb (145.2 kg)   SpO2 96%   BMI 45.92 kg/m   Physical Exam  Constitutional: He is oriented to person, place, and time. He appears well-developed and well-nourished. No distress.  HENT:  Head: Normocephalic.  Eyes: Conjunctivae are normal.  Neck: Normal range of motion. Neck supple.  Cardiovascular: Normal rate.   Pulmonary/Chest: Effort normal.  Abdominal: He exhibits no distension.  Musculoskeletal: Normal range of motion.  Neurological: He is alert and oriented to person, place, and time.  Skin: Skin is warm and dry.  2 areas  of tenderness and induration, one to left axillary line and the other to the left pubic area   Psychiatric: He has a normal mood and affect.  Nursing note and vitals reviewed.    ED Treatments / Results  DIAGNOSTIC STUDIES:  Oxygen Saturation is 96% on RA, normal by my interpretation.    COORDINATION OF CARE:  2:50 PM Discussed treatment plan with pt at bedside and pt agreed to plan.  Labs (all labs ordered are listed, but only abnormal results are displayed) Labs Reviewed - No data to display  EKG  EKG Interpretation None       Radiology No results found.  Procedures Procedures (including critical care time)  2:56 PM EMERGENCY DEPARTMENT US SOFT TISSUE INTERPRETATION "Study: Limited Ultrasound of the noted body part in comments below"  INDICATIONS: Soft tissue infection Multiple views of the body part are obtained with a multi-frequency linear probe  PERFORMED BY:  Myself  IMAGES ARCHIVED?: No  SIDE:Left  BODY PART:Axilla Axillary line, Pubic area   FINDINGS: No abcess noted No fluid collection seen   LIMITATIONS:  Body Habitus  INTERPRETATION:  No abcess noted  Medications Ordered in ED Medications - No data to display   Initial Impression / Assessment and Plan / ED Course  I have reviewed the triage vital signs and the nursing notes.  Pertinent labs & imaging results that were available during my care of the patient were reviewed by me and considered in my medical decision making (see chart for details).  Clinical Course       Patient with skin abscess. No fluid collection requiring I&D noted. Pt sent home with doxycycline. Supportive care and return precautions discussed.  The patient appears reasonably screened and/or stabilized for discharge and I doubt any other emergent medical condition requiring further screening, evaluation, or treatment in the ED prior to discharge.   Final Clinical Impressions(s) / ED Diagnoses   Final diagnoses:    None    New Prescriptions New Prescriptions   No medications on file   I personally performed the services described in this documentation, which was scribed in  my presence. The recorded information has been reviewed and is accurate.      Arthor Captain, PA-C 05/07/16 1620    Shaune Pollack, MD 05/08/16 2103

## 2016-05-07 NOTE — ED Triage Notes (Signed)
Pt here with 3 boils on trunk-- one under left arm, 2 on abd-- states was seen here for same in fast track in past.

## 2016-05-07 NOTE — Discharge Instructions (Signed)
For pain control please take tylenol 3 times a day  for 5 days. Take with food to minimize stomach irritation.  Take your antibiotics as directed and to completion. You should never have any leftover antibiotics! Push fluids and stay well hydrated.   Any antibiotic use can reduce the efficacy of hormonal birth control. Please use back up method of contraception.   Apply warm compresses to the area minimum of 4 times per day. If the pain increases sharply, if you develop fever, nausea or vomiting do not hesitate to return to the emergency room. If the area softens and feels like there is fluid underneath the surface of the skin return to the ED so we can lance the boil.

## 2016-05-31 ENCOUNTER — Encounter (INDEPENDENT_AMBULATORY_CARE_PROVIDER_SITE_OTHER): Payer: Medicaid Other | Admitting: Ophthalmology

## 2016-06-02 ENCOUNTER — Other Ambulatory Visit: Payer: Self-pay | Admitting: Physician Assistant

## 2016-06-04 NOTE — Telephone Encounter (Signed)
Rx refill sent to pharmacy. 

## 2016-06-28 ENCOUNTER — Ambulatory Visit: Payer: Medicaid Other | Admitting: Podiatry

## 2016-06-29 ENCOUNTER — Emergency Department (HOSPITAL_COMMUNITY): Payer: Medicaid Other

## 2016-06-29 ENCOUNTER — Encounter (HOSPITAL_COMMUNITY): Payer: Self-pay

## 2016-06-29 ENCOUNTER — Emergency Department (HOSPITAL_COMMUNITY)
Admission: EM | Admit: 2016-06-29 | Discharge: 2016-06-29 | Disposition: A | Discharge status: Home / Self Care | Payer: Medicaid Other | Attending: Emergency Medicine | Admitting: Emergency Medicine

## 2016-06-29 DIAGNOSIS — R0781 Pleurodynia: Secondary | ICD-10-CM

## 2016-06-29 DIAGNOSIS — I2510 Atherosclerotic heart disease of native coronary artery without angina pectoris: Secondary | ICD-10-CM | POA: Diagnosis not present

## 2016-06-29 DIAGNOSIS — R0789 Other chest pain: Secondary | ICD-10-CM | POA: Diagnosis present

## 2016-06-29 DIAGNOSIS — E1142 Type 2 diabetes mellitus with diabetic polyneuropathy: Secondary | ICD-10-CM | POA: Insufficient documentation

## 2016-06-29 DIAGNOSIS — Z79899 Other long term (current) drug therapy: Secondary | ICD-10-CM | POA: Diagnosis not present

## 2016-06-29 DIAGNOSIS — I130 Hypertensive heart and chronic kidney disease with heart failure and stage 1 through stage 4 chronic kidney disease, or unspecified chronic kidney disease: Secondary | ICD-10-CM | POA: Insufficient documentation

## 2016-06-29 DIAGNOSIS — E039 Hypothyroidism, unspecified: Secondary | ICD-10-CM | POA: Insufficient documentation

## 2016-06-29 DIAGNOSIS — Z794 Long term (current) use of insulin: Secondary | ICD-10-CM | POA: Diagnosis not present

## 2016-06-29 DIAGNOSIS — Z7901 Long term (current) use of anticoagulants: Secondary | ICD-10-CM | POA: Insufficient documentation

## 2016-06-29 DIAGNOSIS — Z87891 Personal history of nicotine dependence: Secondary | ICD-10-CM | POA: Insufficient documentation

## 2016-06-29 DIAGNOSIS — N184 Chronic kidney disease, stage 4 (severe): Secondary | ICD-10-CM | POA: Diagnosis not present

## 2016-06-29 DIAGNOSIS — I5022 Chronic systolic (congestive) heart failure: Secondary | ICD-10-CM | POA: Diagnosis not present

## 2016-06-29 MED ORDER — OXYCODONE-ACETAMINOPHEN 5-325 MG PO TABS
1.0000 | ORAL_TABLET | Freq: Once | ORAL | Status: AC
  Administered 2016-06-29: 1 via ORAL
  Filled 2016-06-29: qty 1

## 2016-06-29 NOTE — ED Provider Notes (Signed)
[facility]-EMERGENCY DEPT Provider Note   CSN: [id number] Arrival date & time: 06/29/16  0940     History   Chief Complaint Chief Complaint  Patient presents with  . Chest Pain    HPI Charles Montgomery is a 51 y.o. male.  HPI   51 year old male with extensive past medical history presents today with complaints of chest pain. Patient notes that over the last 5 days he's had upper respiratory congestion, runny nose and cough. Patient notes that his right anterior chest wall started to hurt after trying to force nasal secretions out. Patient notes that yesterday he sneezed and felt an extreme pain in his right anterior chest wall. He reports this pain is worse with deep inspiration, reports this is worse with palpation of the chest wall. Patient denies any shortness of breath, he denies any productive cough, denies any abdominal pain. Patient has no diaphoresis, lower extremity swelling or edema over his baseline, or any other concerning signs or symptoms for ACS or heart failure. Patient notes a history DVT, currently on request taking as directed. Patient did not take any meds prior to arrival.   Past Medical History:  Diagnosis Date  . Anemia   . Arthritis   . CAD (coronary artery disease), native coronary artery 02/19/2016   Nonobstructive by cath 2017  . Cigarette nicotine dependence, uncomplicated   . CKD (chronic kidney disease)    neuropologist is Dr Britt Bottom  . Complication of anesthesia    took more medication "due to sleep apena"  . DCM (dilated cardiomyopathy) (HCC) 04/12/2015   hypertensive.  Initial EF 20-25% by echo 03/2016 and normalized on echo 05/2015 with EF 60-65%.  . Depression   . Diabetes mellitus    type  . Diabetic neuropathy (HCC)   . Diabetic retinopathy of left eye (HCC)   . Fatigue   . 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 GERD (gastroesophageal reflux disease)   . Hyperlipidemia   . Hypertension   . Hypothyroid     pt denies this  . Leg DVT (deep venous thromboembolism), chronic (HCC)   . Macular edema   . Nephrotic syndrome   . Neuropathy (HCC)   . Obesity   . OSA (obstructive sleep apnea) 06/26/2015   Severe with AHI 118/hr now on CPAP at 20cm H2O - no longer using cpap, being tested for bipap  . Peripheral neuropathy (HCC)   . Shortness of breath dyspnea    with exertion  . Snores     Patient Active Problem List   Diagnosis Date Noted  . CAD (coronary artery disease), native coronary artery 02/19/2016  . Chronic kidney disease (CKD), stage IV (severe) (HCC) 09/12/2015  . OSA (obstructive sleep apnea) 06/26/2015  . ETOH abuse 05/08/2015  . Elevated ferritin level 05/08/2015  . DCM (dilated cardiomyopathy) (HCC) 04/12/2015  . Chest pain 04/12/2015  . Excessive daytime sleepiness 04/12/2015  . Snoring 04/12/2015  . Bruit 04/12/2015  . GERD (gastroesophageal reflux disease)   . High cholesterol   . Hypothyroid   . Obesity   . Diabetic neuropathy (HCC)   . Hyperlipidemia   . Diabetic retinopathy of left eye (HCC)   . Chronic systolic heart failure (HCC)   . CKD (chronic kidney disease)   . Leg DVT (deep venous thromboembolism), chronic (HCC)   . Fatigue   . Snores   . Cigarette nicotine dependence, uncomplicated   . 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 S/P  biopsy   . Proteinuria   . Edema   . SOB (shortness of breath)   . Acute renal failure syndrome (HCC) 12/31/2014  . Nephrotic syndrome 12/31/2014  . Hypoalbuminemia 12/31/2014  . Peripheral edema 12/31/2014  . Diabetes mellitus 12/31/2014  . Hypertension 12/31/2014  . AKI (acute kidney injury) (HCC) 12/31/2014  . AP (abdominal pain)     Past Surgical History:  Procedure Laterality Date  . AV FISTULA PLACEMENT Left 09/22/2015   Procedure: RADIOCEPHALIC ARTERIOVENOUS (AV) FISTULA CREATION;  Surgeon: Pryor Ochoa, MD;  Location: [facility] OR;  Service: Vascular;  Laterality: Left;  .  AV FISTULA PLACEMENT Left 10/25/2015   Procedure: CREATION OF LEFT ARM BRACHIOCEPHALIC ARTERIOVENOUS (AV) FISTULA ;  Surgeon: Pryor Ochoa, MD;  Location: [facility] OR;  Service: Vascular;  Laterality: Left;  . BIPAP TITRATION  09/22/2015  . CARDIAC CATHETERIZATION N/A 05/10/2015   Procedure: Left Heart Cath and Coronary Angiography;  Surgeon: Lyn Records, MD;  Location: [facility] INVASIVE CV LAB;  Service: Cardiovascular;  Laterality: N/A;  . WISDOM TOOTH EXTRACTION         Home Medications    Prior to Admission medications   Medication Sig Start Date End Date Taking? Authorizing Provider  apixaban (ELIQUIS) 2.5 MG TABS tablet Take 2.5 mg by mouth 2 (two) times daily.   Yes Historical Provider, MD  buPROPion (WELLBUTRIN XL) 150 MG 24 hr tablet Take 1 tablet by mouth daily. 10/22/15  Yes Historical Provider, MD  carvedilol (COREG) 25 MG tablet TAKE ONE TABLET BY MOUTH TWICE DAILY WITH  A  MEAL 04/15/16  Yes Quintella Reichert, MD  ciclopirox (PENLAC) 8 % solution Apply topically at bedtime. Apply over nail and surrounding skin. Apply daily over previous coat. After seven (7) days, may remove with alcohol and continue cycle. 09/25/15  Yes Vivi Barrack, DPM  fenofibrate (TRICOR) 48 MG tablet Take 48 mg by mouth 2 (two) times daily. Reported on 05/08/2015 04/12/15  Yes Historical Provider, MD  furosemide (LASIX) 80 MG tablet Take 40 mg by mouth 2 (two) times daily.  10/22/15  Yes Historical Provider, MD  glimepiride (AMARYL) 4 MG tablet Take 4 mg by mouth daily with breakfast.   Yes Historical Provider, MD  GORDONS UREA 40 % ointment APPLY TOPICALLY AS NEEDED 01/17/16  Yes Vivi Barrack, DPM  HUMALOG KWIKPEN 200 UNIT/ML SOPN Inject 0-20 Units into the skin 3 (three) times daily as needed. Based on sliding scale: If BG is greater than 200, inject 20 units into the skin once If BG is less than 200, DO NOT use any 06/13/15  Yes Historical Provider, MD  hydrALAZINE (APRESOLINE) 50 MG tablet TAKE ONE TABLET BY MOUTH  THREE TIMES DAILY 06/04/16  Yes Quintella Reichert, MD  insulin detemir (LEVEMIR) 100 UNIT/ML injection Inject 50 Units into the skin 3 (three) times daily.    Yes Historical Provider, MD  NONFORMULARY OR COMPOUNDED ITEM [facility] compound:  Antiinflammatory cream - Diclofenac 3%, Baclofen 2%, Cyclobenzaprine 2%, Lidocaine 2%, dispense 180gram, apply 1-2 grams to affected area 3-4 times daily, +4refills. 09/28/15  Yes Vivi Barrack, DPM  rosuvastatin (CRESTOR) 20 MG tablet Take 1 tablet (20 mg total) by mouth daily. 02/22/16 02/16/17 Yes Quintella Reichert, MD  trimethoprim-polymyxin b (POLYTRIM) ophthalmic solution Place 3-4 drops into both eyes See admin instructions. Three times a day on the first day after injection, on the second day four times.  Only use for two days after eye injection once  a month.   Yes Historical Provider, MD  doxycycline (VIBRAMYCIN) 100 MG capsule Take 1 capsule (100 mg total) by mouth 2 (two) times daily. Patient not taking: Reported on 06/29/2016 05/07/16   Arthor Captain, PA-C  HYDROcodone-acetaminophen (NORCO/VICODIN) 5-325 MG tablet Take 1 tablet by mouth every 4 (four) hours as needed. Patient not taking: Reported on 06/29/2016 01/09/16   Garlon Hatchet, PA-C    Family History Family History  Problem Relation Age of Onset  . Leukemia Mother   . Hypertension Father   . Diabetes type II Father   . Deep vein thrombosis Father   . Renal Disease Father     renal transplant  . Healthy Sister   . Healthy Brother     Social History Social History  Substance Use Topics  . Smoking status: Former Smoker    Packs/day: 0.25    Years: 30.00    Types: Cigarettes    Quit date: 05/09/2015  . Smokeless tobacco: Never Used  . Alcohol use No     Allergies   Patient has no known allergies.   Review of Systems Review of Systems  All other systems reviewed and are negative.   Physical Exam Updated Vital Signs BP 141/85 (BP Location: Right Arm)   Pulse 87    Temp 98.6 F (37 C) (Oral)   Resp 20   Ht 5\' 10"  (1.778 m)   Wt (!) 147.4 kg   SpO2 98%   BMI 46.63 kg/m   Physical Exam  Constitutional: He is oriented to person, place, and time. He appears well-developed and well-nourished.  HENT:  Head: Normocephalic and atraumatic.  Eyes: Conjunctivae are normal. Pupils are equal, round, and reactive to light. Right eye exhibits no discharge. Left eye exhibits no discharge. No scleral icterus.  Neck: Normal range of motion. No JVD present. No tracheal deviation present.  Pulmonary/Chest: Effort normal. No stridor.  Chest is atraumatic, extreme tenderness to light palpation of the right anterior lower chest wall, no rash, lung sounds clear with normal expansion.  Abdominal: Soft. He exhibits no distension.  Musculoskeletal: He exhibits no edema.  Neurological: He is alert and oriented to person, place, and time. Coordination normal.  Psychiatric: He has a normal mood and affect. His behavior is normal. Judgment and thought content normal.  Nursing note and vitals reviewed.    ED Treatments / Results  Labs (all labs ordered are listed, but only abnormal results are displayed) Labs Reviewed - No data to display  EKG  EKG Interpretation None       Radiology Dg Chest 2 View  Result Date: 06/29/2016 CLINICAL DATA:  Chest pain x3 days. Cough and congestion x1 week. Hx of CHF, DM, HTN, GERD, leg DVT. Smoker. EXAM: CHEST - 2 VIEW COMPARISON:  01/01/2015 FINDINGS: Mild perihilar and bibasilar interstitial infiltrate, increased since previous exam. No confluent airspace disease. Heart size upper limits normal, stable. No effusion.  No pneumothorax. Visualized bones unremarkable. IMPRESSION: 1. Mild perihilar and bibasilar interstitial infiltrate or edema, slightly increased since previous exam. Electronically Signed   By: Corlis Leak M.D.   On: 06/29/2016 10:43    Procedures Procedures (including critical care time)  Medications Ordered in  ED Medications  oxyCODONE-acetaminophen (PERCOCET/ROXICET) 5-325 MG per tablet 1 tablet (1 tablet Oral Given 06/29/16 1120)     Initial Impression / Assessment and Plan / ED Course  I have reviewed the triage vital signs and the nursing notes.  Pertinent labs & imaging results that were  available during my care of the patient were reviewed by me and considered in my medical decision making (see chart for details).      Final Clinical Impressions(s) / ED Diagnoses   Final diagnoses:  Rib pain    Labs:   Imaging: DG chest 2 view  Consults:  Therapeutics:  Discharge Meds:   Assessment/Plan: 51 year old male presents today with rib pain. This is likely secondary to mechanical etiology. Patient has no signs of respiratory distress, no signs of pulmonary embolism, infection, or any significant intrathoracic abnormality. Patient is requesting " Perks" for his pain, reporting that medicine here did not improve his symptoms. I do not see any signs of fracture or infection on his exam. No need for opioid management as an outpatient. Patient is instructed use Tylenol, ice, follow-up with primary care for reassessment. He is given strict return precautions, he verbalized understanding and agreement to today's plan had no further questions or concerns.       New Prescriptions New Prescriptions   No medications on file     Eyvonne Mechanic, PA-C 06/29/16 1249    Mancel Bale, MD 06/29/16 [phone]

## 2016-06-29 NOTE — ED Triage Notes (Signed)
Pt. Reports having rt. Side chest pain, developed a few days ago.  He thought at first he slept wrong on it.  Last night he sneezed and the pain was severe. The pain increases with exhale.  Skin is warm and dry.  Pt. Denies any sob n/d/v denies any dizziness.  Pt. Is getting over cold symptoms.  Pt. Is alert and oriented X4

## 2016-06-29 NOTE — Discharge Instructions (Signed)
Please read attached information. Please follow-up with her primary care provider in 2 days for reassessment. Please continue using Tylenol as needed for discomfort. Return to the emergency room immediately if you have any new or worsening signs or symptoms.

## 2016-07-12 ENCOUNTER — Ambulatory Visit (INDEPENDENT_AMBULATORY_CARE_PROVIDER_SITE_OTHER): Payer: Medicaid Other | Admitting: Podiatry

## 2016-07-12 ENCOUNTER — Encounter: Payer: Self-pay | Admitting: Podiatry

## 2016-07-12 VITALS — Ht 70.0 in | Wt 322.0 lb

## 2016-07-12 DIAGNOSIS — E0849 Diabetes mellitus due to underlying condition with other diabetic neurological complication: Secondary | ICD-10-CM

## 2016-07-12 DIAGNOSIS — M79676 Pain in unspecified toe(s): Secondary | ICD-10-CM

## 2016-07-12 DIAGNOSIS — B351 Tinea unguium: Secondary | ICD-10-CM

## 2016-07-12 NOTE — Progress Notes (Signed)
Complaint:  Visit Type: Patient returns to my office for continued preventative foot care services. Complaint: Patient states" my nails have grown long and thick and become painful to walk and wear shoes" Patient has been diagnosed with DM with neuropathy. The patient presents for preventative foot care services. No changes to ROS  Podiatric Exam: Vascular: dorsalis pedis and posterior tibial pulses are palpable bilateral. Capillary return is immediate. Temperature gradient is WNL. Skin turgor WNL  Sensorium: Diminished  Semmes Weinstein monofilament test. Normal tactile sensation bilaterally. Nail Exam: Pt has thick disfigured discolored nails with subungual debris noted bilateral entire nail hallux through fifth toenails Ulcer Exam: There is no evidence of ulcer or pre-ulcerative changes or infection. Orthopedic Exam: Muscle tone and strength are WNL. No limitations in general ROM. No crepitus or effusions noted. 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 No infection or ulcers  Diagnosis:  Onychomycosis, , Pain in right toe, pain in left toes  Treatment & Plan Procedures and Treatment: Consent by patient was obtained for treatment procedures. The patient understood the discussion of treatment and procedures well. All questions were answered thoroughly reviewed. Debridement of mycotic and hypertrophic toenails, 1 through 5 bilateral and clearing of subungual debris. No ulceration, no infection noted. Discussed nail surgery for hallux nails.   Return Visit-Office Procedure: Patient instructed to return to the office for a follow up visit  for continued evaluation and treatment.    Helane Gunther DPM

## 2016-07-18 ENCOUNTER — Encounter: Payer: Self-pay | Admitting: Podiatry

## 2016-07-18 ENCOUNTER — Ambulatory Visit (INDEPENDENT_AMBULATORY_CARE_PROVIDER_SITE_OTHER): Payer: 59 | Admitting: Podiatry

## 2016-07-18 VITALS — BP 191/107 | HR 98

## 2016-07-18 DIAGNOSIS — B351 Tinea unguium: Secondary | ICD-10-CM | POA: Diagnosis not present

## 2016-07-18 DIAGNOSIS — M79676 Pain in unspecified toe(s): Secondary | ICD-10-CM

## 2016-07-18 DIAGNOSIS — E0849 Diabetes mellitus due to underlying condition with other diabetic neurological complication: Secondary | ICD-10-CM

## 2016-07-18 NOTE — Progress Notes (Signed)
Complaint:  Visit Type: Patient returns to my office for scheduled nail surgery for the removal of the great toenail on the left foot. Patient is a diabetic and has been on eliquis.  He has stopped taking his medication over 3 days ago in preparation for the nail surgery. She states that his blood sugar is under control prior to performing the surgery. His blood pressure was noted to be 191/107 patient admits that he did not take his blood pressure medicine this morning  Podiatric Exam: Vascular: dorsalis pedis and posterior tibial pulses are palpable bilateral. Capillary return is immediate. Temperature gradient is WNL. Skin turgor WNL  Sensorium: Diminished  Semmes Weinstein monofilament test. Normal tactile sensation bilaterally. Nail Exam: Pt has thick disfigured discolored nails with subungual debris noted bilateral entire nail hallux through fifth toenails Ulcer Exam: There is no evidence of ulcer or pre-ulcerative changes or infection. Orthopedic Exam: Muscle tone and strength are WNL. No limitations in general ROM. No crepitus or effusions noted. 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 No infection or ulcers  Diagnosis:  Onychomycosis, , Pain in right toe, pain in left toes  Treatment & Plan Procedures and Treatment: Consent by patient was obtained for treatment procedures. The patient understood the discussion of treatment and procedures well. As he took his blood pressure prior to the nail surgery. I became very concerned about his pressure. Since he says he is nervous as well as not taking his medicine. We discussed this at length and decided to postpone the surgery until next week on Wednesday. He will discontinue his L equinus on Saturday night and keeps his sugar under control. He will take his blood pressure medicine in the morning prior to surgery.   Return Visit-Office Procedure: Patient instructed to return to the office for surgery next week.    Helane Gunther DPM

## 2016-07-24 ENCOUNTER — Encounter: Payer: Self-pay | Admitting: Podiatry

## 2016-07-24 ENCOUNTER — Ambulatory Visit (INDEPENDENT_AMBULATORY_CARE_PROVIDER_SITE_OTHER): Payer: 59 | Admitting: Podiatry

## 2016-07-24 DIAGNOSIS — B351 Tinea unguium: Secondary | ICD-10-CM

## 2016-07-24 DIAGNOSIS — M2142 Flat foot [pes planus] (acquired), left foot: Secondary | ICD-10-CM

## 2016-07-24 DIAGNOSIS — E0849 Diabetes mellitus due to underlying condition with other diabetic neurological complication: Secondary | ICD-10-CM

## 2016-07-24 DIAGNOSIS — M2141 Flat foot [pes planus] (acquired), right foot: Secondary | ICD-10-CM

## 2016-07-24 NOTE — Progress Notes (Signed)
This patient presents the office for scheduled nail removal of the left great toenail. His blood pressure revealed that was approximately 204/115 does say that this is due to his nervousness has taken his high blood pressure medicine and stopped his alcohol requests and is prepared for surgery. I told the patient I am uncomfortable performing surgery with his blood pressure, so high. Therefore, I told him that he needs to discuss this with his medical doctor to get his approval for surgery. I told the patient if I needed to I could prescribe him some Valium in an effort to calm him down and keep his blood  pressure from being elevated.   Helane Gunther DPM

## 2016-08-13 ENCOUNTER — Other Ambulatory Visit: Payer: BLUE CROSS/BLUE SHIELD

## 2016-08-14 ENCOUNTER — Encounter: Payer: Self-pay | Admitting: Physician Assistant

## 2016-08-14 ENCOUNTER — Ambulatory Visit: Payer: BLUE CROSS/BLUE SHIELD | Admitting: Physician Assistant

## 2016-08-14 NOTE — Progress Notes (Deleted)
Cardiology Office Note    Date:  08/14/2016  ID:  Charles Montgomery, DOB 06-Aug-1965, MRN [id number] PCP:  Georgann Housekeeper, MD  Cardiologist:  Dr. Mayford Knife Nephrologist: Dr. Lowell Guitar   Chief Complaint: CHF  History of Present Illness:  Charles Montgomery is a 51 y.o. male with history of chronic systolic CHF, NICM secondary to poorly controlled HTN (with subsequent normalization of LVEF), mild nonobstructive CAD by cath 04/2015, DM, GERD, hypothyroidism, HTN, tobacco abuse, dyslipidemia, DVT on Eliquis, CKD stage IV (not on ACEI due to this, s/p BC AVF), focal glomerulonephritis and nephrotic syndrome, OSA, morbid obesity, chronic anemia, prior cocaine and alcohol abuse who presents for f/u of CHF. Prior echo 12/2014 showed EF 20-25%.  LHC 04/2015: patent coronaries, 15% ramus, 20% dLAD. F/u 2D Echo 05/2015: mild-moderate LVH, EF 60-65%, no RWMA. Last labs showed trig 135, HDL 24, LDL 68, normal ALT (03/2016), K 4.8, Cr 4.71 in 02/2016 then 4.0 in 06/2016, Hg 10.8 in 06/2016. Saw Dr. Lowell Guitar 06/2016 at which time it was recommended to proceed with preparations for RRT as his weight loss and lack of taste were suspected due to early uremia.   Nephrologist: Dr. Lowell Guitar   recommended repeat lipids 4 months (07/2016)   Chronic systolic CHF/DCM HTN CKD stage IV-V Mild CAD Dyslipidemia    Past Medical History:  Diagnosis Date  . Anemia   . Arthritis   . Chronic systolic CHF (congestive heart failure) (HCC)    a. Prior EF 20-25% in 2016 with cath showing minimal CAD. b. 2D echo 05/2015: mild-moderate LVH, EF 60-65%, no RWMA.  . CKD (chronic kidney disease)    neuropologist is Dr Britt Bottom  . Complication of anesthesia    took more medication "due to sleep apena"  . DCM (dilated cardiomyopathy) (HCC) 04/12/2015   hypertensive.  Initial EF 20-25% by echo 03/2016 and normalized on echo 05/2015 with EF 60-65%.  . Depression   . Diabetes mellitus    type  . Diabetic neuropathy (HCC)   . Diabetic  retinopathy of left eye (HCC)   . Fatigue   . 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 GERD (gastroesophageal reflux disease)   . History of alcohol abuse   . History of cocaine abuse   . Hyperlipidemia   . Hypertension   . Hypertensive heart disease   . Hypothyroid    pt denies this  . Leg DVT (deep venous thromboembolism), chronic (HCC)   . Macular edema   . Mild CAD 02/19/2016    LHC 04/2015: patent coronaries, 15% ramus, 20% dLAD.   . Morbid obesity (HCC)   . Nephrotic syndrome   . Neuropathy (HCC)   . Obesity   . OSA (obstructive sleep apnea) 06/26/2015   Severe with AHI 118/hr now on CPAP at 20cm H2O - no longer using cpap, being tested for bipap  . Peripheral neuropathy (HCC)   . Tobacco abuse     Past Surgical History:  Procedure Laterality Date  . AV FISTULA PLACEMENT Left 09/22/2015   Procedure: RADIOCEPHALIC ARTERIOVENOUS (AV) FISTULA CREATION;  Surgeon: Pryor Ochoa, MD;  Location: [facility] OR;  Service: Vascular;  Laterality: Left;  . AV FISTULA PLACEMENT Left 10/25/2015   Procedure: CREATION OF LEFT ARM BRACHIOCEPHALIC ARTERIOVENOUS (AV) FISTULA ;  Surgeon: Pryor Ochoa, MD;  Location: [facility] OR;  Service: Vascular;  Laterality: Left;  . BIPAP TITRATION  09/22/2015  . CARDIAC CATHETERIZATION N/A 05/10/2015   Procedure: Left Heart Cath and Coronary Angiography;  Surgeon: Lyn Records, MD;  Location: [facility] INVASIVE CV LAB;  Service: Cardiovascular;  Laterality: N/A;  . WISDOM TOOTH EXTRACTION      Current Medications: Current Outpatient Prescriptions  Medication Sig Dispense Refill  . apixaban (ELIQUIS) 2.5 MG TABS tablet Take 2.5 mg by mouth 2 (two) times daily.    Marland Kitchen buPROPion (WELLBUTRIN XL) 150 MG 24 hr tablet Take 1 tablet by mouth daily.    . carvedilol (COREG) 25 MG tablet TAKE ONE TABLET BY MOUTH TWICE DAILY WITH  A  MEAL 60 tablet 11  . ciclopirox (PENLAC) 8 % solution Apply topically at bedtime. Apply over nail and surrounding  skin. Apply daily over previous coat. After seven (7) days, may remove with alcohol and continue cycle. 6.6 mL 4  . doxycycline (VIBRAMYCIN) 100 MG capsule Take 1 capsule (100 mg total) by mouth 2 (two) times daily. 20 capsule 0  . fenofibrate (TRICOR) 48 MG tablet Take 48 mg by mouth 2 (two) times daily. Reported on 05/08/2015    . furosemide (LASIX) 80 MG tablet Take 40 mg by mouth 2 (two) times daily.     Marland Kitchen glimepiride (AMARYL) 4 MG tablet Take 4 mg by mouth daily with breakfast.    . GORDONS UREA 40 % ointment APPLY TOPICALLY AS NEEDED 30 g 3  . HUMALOG KWIKPEN 200 UNIT/ML SOPN Inject 0-20 Units into the skin 3 (three) times daily as needed. Based on sliding scale: If BG is greater than 200, inject 20 units into the skin once If BG is less than 200, DO NOT use any    . hydrALAZINE (APRESOLINE) 50 MG tablet TAKE ONE TABLET BY MOUTH THREE TIMES DAILY 90 tablet 3  . HYDROcodone-acetaminophen (NORCO/VICODIN) 5-325 MG tablet Take 1 tablet by mouth every 4 (four) hours as needed. 6 tablet 0  . insulin detemir (LEVEMIR) 100 UNIT/ML injection Inject 50 Units into the skin 3 (three) times daily.     . NONFORMULARY OR COMPOUNDED ITEM [facility] compound:  Antiinflammatory cream - Diclofenac 3%, Baclofen 2%, Cyclobenzaprine 2%, Lidocaine 2%, dispense 180gram, apply 1-2 grams to affected area 3-4 times daily, +4refills. 180 each 4  . rosuvastatin (CRESTOR) 20 MG tablet Take 1 tablet (20 mg total) by mouth daily. 30 tablet 11  . trimethoprim-polymyxin b (POLYTRIM) ophthalmic solution Place 3-4 drops into both eyes See admin instructions. Three times a day on the first day after injection, on the second day four times.  Only use for two days after eye injection once a month.     No current facility-administered medications for this visit.      Allergies:   Patient has no known allergies.   Social History   Social History  . Marital status: Married    Spouse name: N/A  . Number of children: N/A    . Years of education: N/A   Social History Main Topics  . Smoking status: Former Smoker    Packs/day: 0.25    Years: 30.00    Types: Cigarettes    Quit date: 05/09/2015  . Smokeless tobacco: Never Used  . Alcohol use No  . Drug use: No  . Sexual activity: Not on file   Other Topics Concern  . Not on file   Social History Narrative  . No narrative on file     Family History:  Family History  Problem Relation Age of Onset  . Leukemia Mother   . Hypertension Father   . Diabetes type II Father   .  Deep vein thrombosis Father   . Renal Disease Father     renal transplant  . Healthy Sister   . Healthy Brother    ***  ROS:   Please see the history of present illness. Otherwise, review of systems is positive for ***.  All other systems are reviewed and otherwise negative.    PHYSICAL EXAM:   VS:  There were no vitals taken for this visit.  BMI: There is no height or weight on file to calculate BMI. GEN: Well nourished, well developed, in no acute distress  HEENT: normocephalic, atraumatic Neck: no JVD, carotid bruits, or masses Cardiac: ***RRR; no murmurs, rubs, or gallops, no edema  Respiratory:  clear to auscultation bilaterally, normal work of breathing GI: soft, nontender, nondistended, + BS MS: no deformity or atrophy  Skin: warm and dry, no rash Neuro:  Alert and Oriented x 3, Strength and sensation are intact, follows commands Psych: euthymic mood, full affect  Wt Readings from Last 3 Encounters:  07/12/16 (!) 322 lb (146.1 kg)  06/29/16 (!) 325 lb (147.4 kg)  05/07/16 (!) 320 lb (145.2 kg)      Studies/Labs Reviewed:   EKG:  EKG was ordered today and personally reviewed by me and demonstrates *** EKG was not ordered today.***  Recent Labs: 10/25/2015: Hemoglobin 12.2 02/20/2016: BUN 47; Creat 4.71; Potassium 4.8; Sodium 138 04/08/2016: ALT 14   Lipid Panel    Component Value Date/Time   CHOL 119 04/08/2016 0906   TRIG 134 04/08/2016 0906   HDL  24 (L) 04/08/2016 0906   CHOLHDL 5.0 (H) 04/08/2016 0906   VLDL 27 04/08/2016 0906   LDLCALC 68 04/08/2016 0906    Additional studies/ records that were reviewed today include: Summarized above.***    ASSESSMENT & PLAN:   1. ***  Disposition: F/u with ***   Medication Adjustments/Labs and Tests Ordered: Current medicines are reviewed at length with the patient today.  Concerns regarding medicines are outlined above. Medication changes, Labs and Tests ordered today are summarized above and listed in the Patient Instructions accessible in Encounters.   Thomasene Mohair PA-C  08/14/2016 8:14 AM    [facility] [address] [place], [place], [place]  [address] Phone: [phone]; Fax: [phone]

## 2016-08-20 ENCOUNTER — Encounter: Payer: Self-pay | Admitting: Physician Assistant

## 2016-11-25 ENCOUNTER — Telehealth: Payer: Self-pay | Admitting: Cardiology

## 2016-11-25 NOTE — Telephone Encounter (Signed)
F/u message  Delice Bison from [facility] call to f/u on fax for certificate of medical necessity to see if it was received. Please call back to discuss

## 2016-11-25 NOTE — Telephone Encounter (Signed)
Informed [facility] that Dr. Mayford Knife is out this week and her nurse is not in the office today.  Explained that nurse would contact them when she returns this week. [facility] did state that they re-faxed paperwork today.

## 2016-11-27 NOTE — Telephone Encounter (Signed)
Paper work for medical necessity for patient has been placed in to be signed folder for Dr Mayford Knife.

## 2016-12-05 IMAGING — DX DG CHEST 2V
2 series · 2 of 2 positions shown · non-contrast
Comparison: Two-view chest x-ray 03/24/2012

CLINICAL DATA: Shortness breath for 1 month.  Diabetes.

EXAM:
CHEST - 2 VIEW

[chest pa]
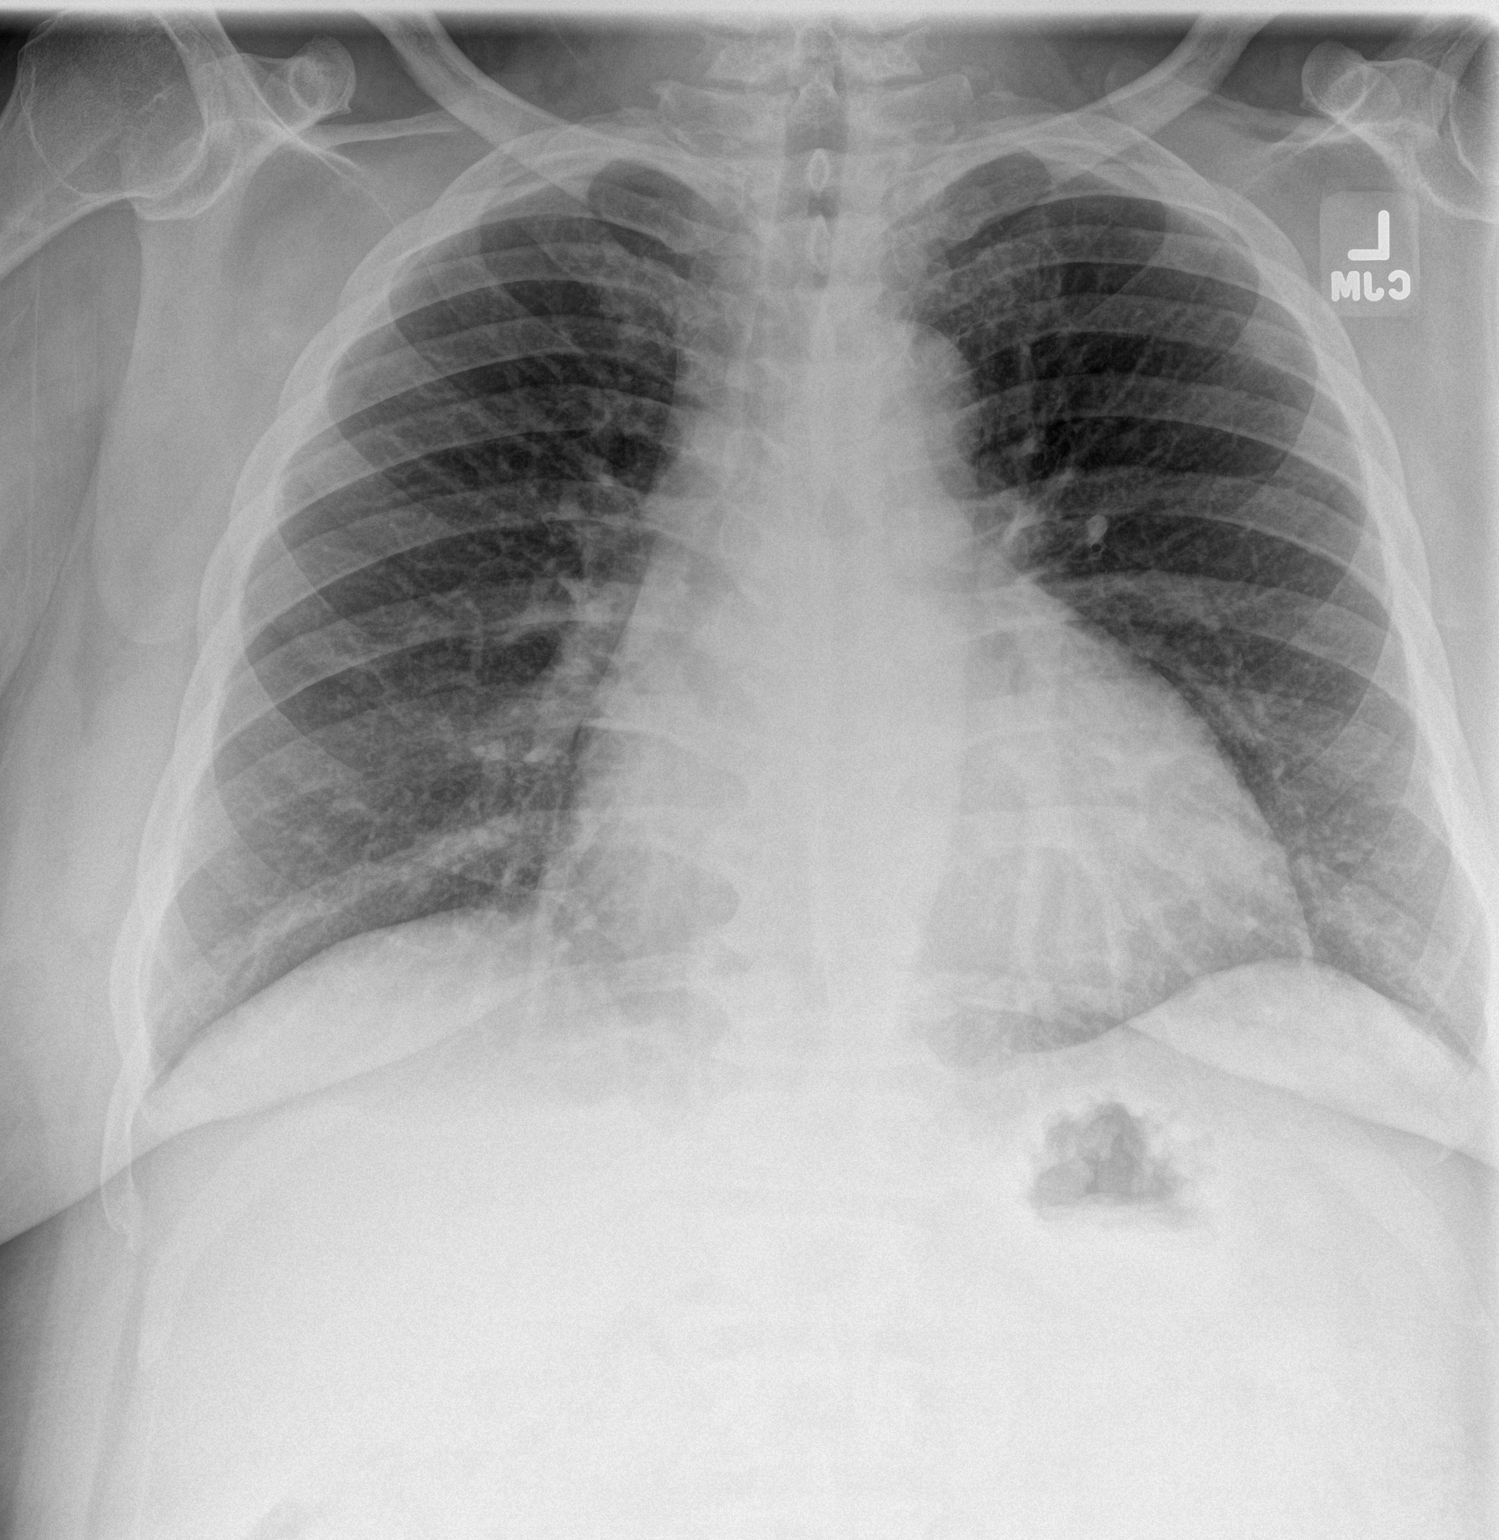

[chest lat]
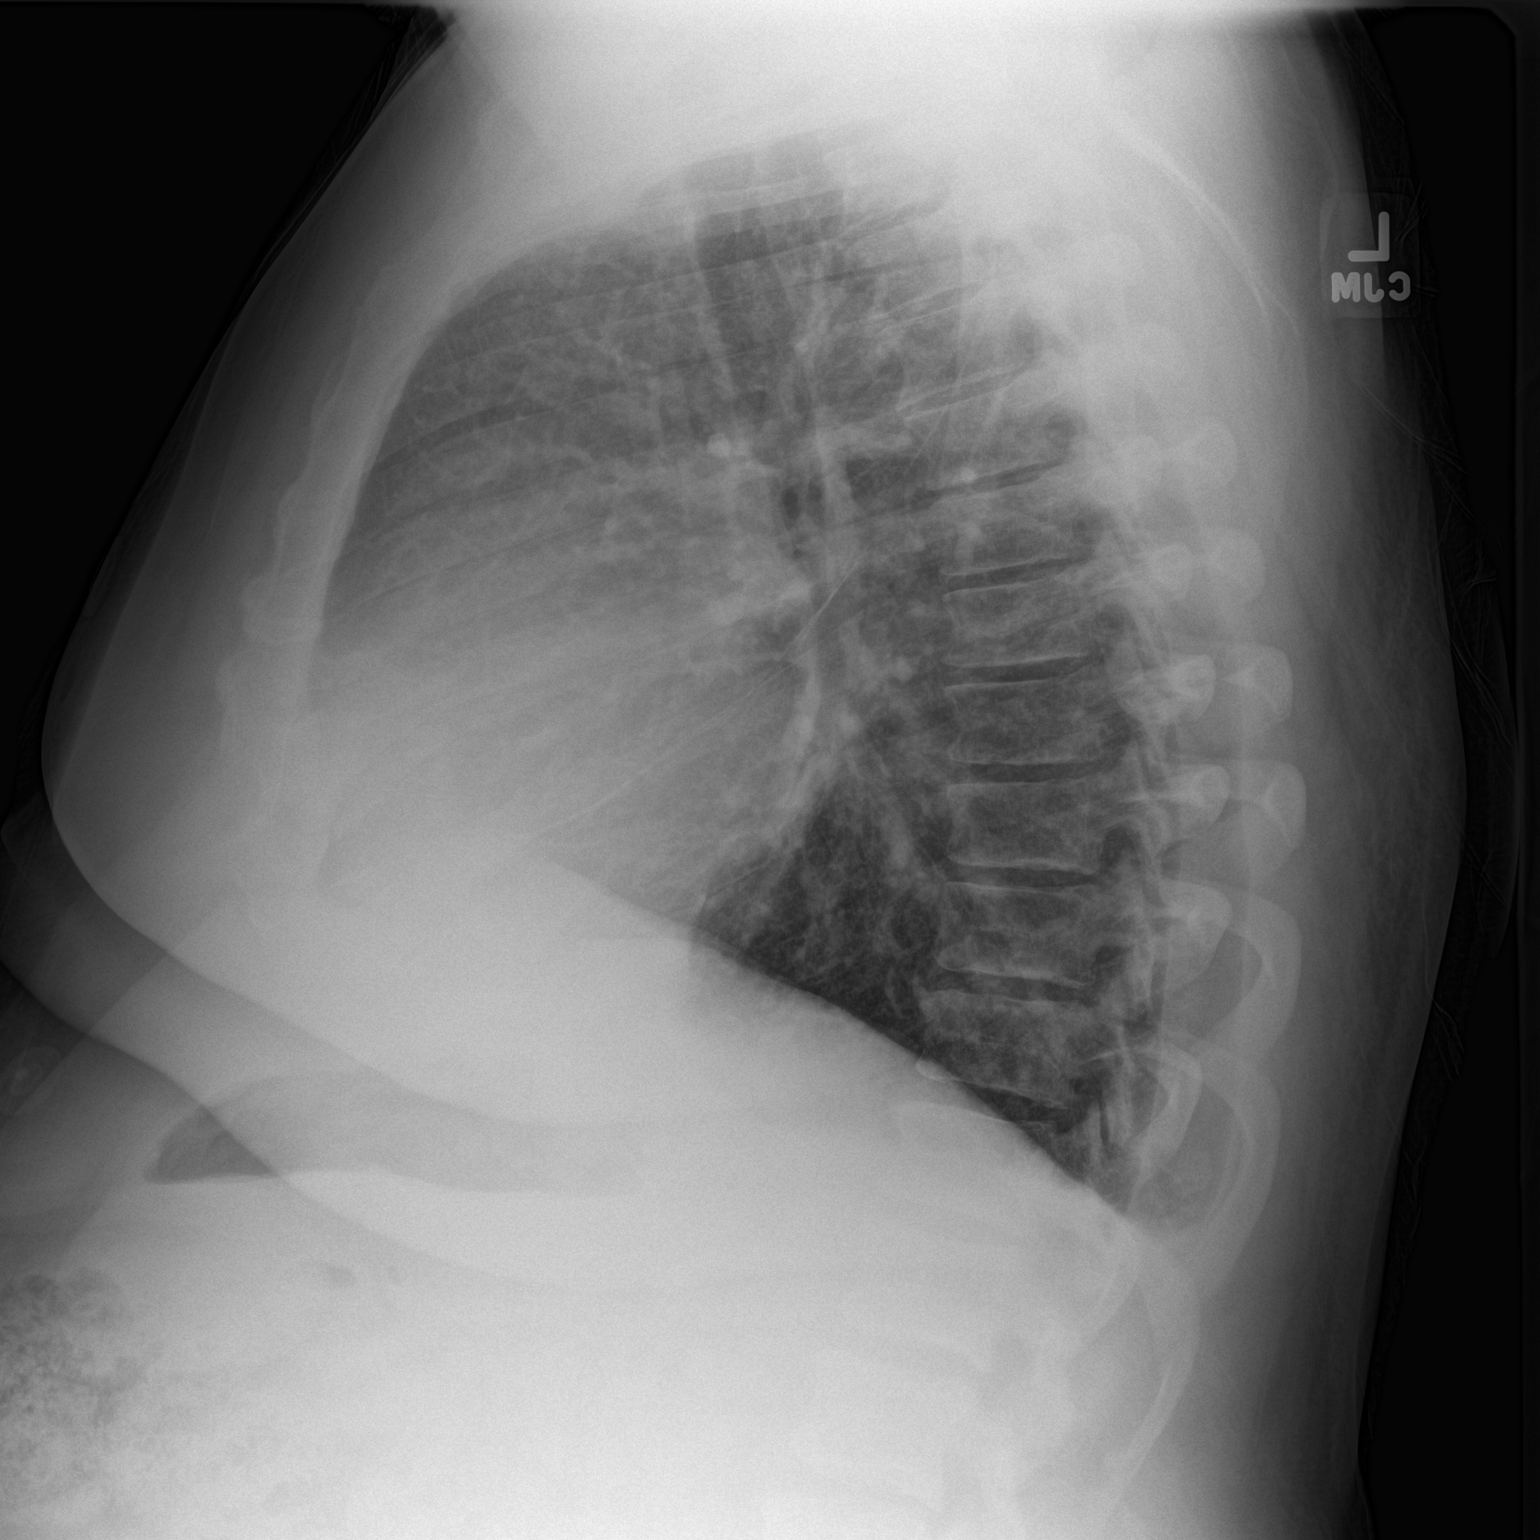

[2 of 2 positions shown; findings below may reference images not displayed]

FINDINGS: The heart is enlarged. Mild pulmonary vascular congestion is
present. No focal airspace disease is present. There are no
effusions.
IMPRESSION: Cardiomegaly and mild pulmonary vascular congestion.

## 2017-02-26 ENCOUNTER — Telehealth: Payer: Self-pay | Admitting: *Deleted

## 2017-02-26 MED ORDER — CICLOPIROX 8 % EX SOLN: Freq: Every day | CUTANEOUS | 7 refills | Status: AC

## 2017-02-26 NOTE — Telephone Encounter (Signed)
Refill request [place]. Dr. Ardelle Anton states refill as previously, but will need an appt prior to future refills. Return fax with refill information.

## 2018-06-05 IMAGING — CR DG CHEST 2V
2 series · 2 of 2 positions shown · non-contrast
Comparison: 01/01/2015

CLINICAL DATA: Chest pain x3 days. Cough and congestion x1 week. Hx
of CHF, DM, HTN, GERD, leg DVT. Smoker.

EXAM:
CHEST - 2 VIEW

[chest pa]
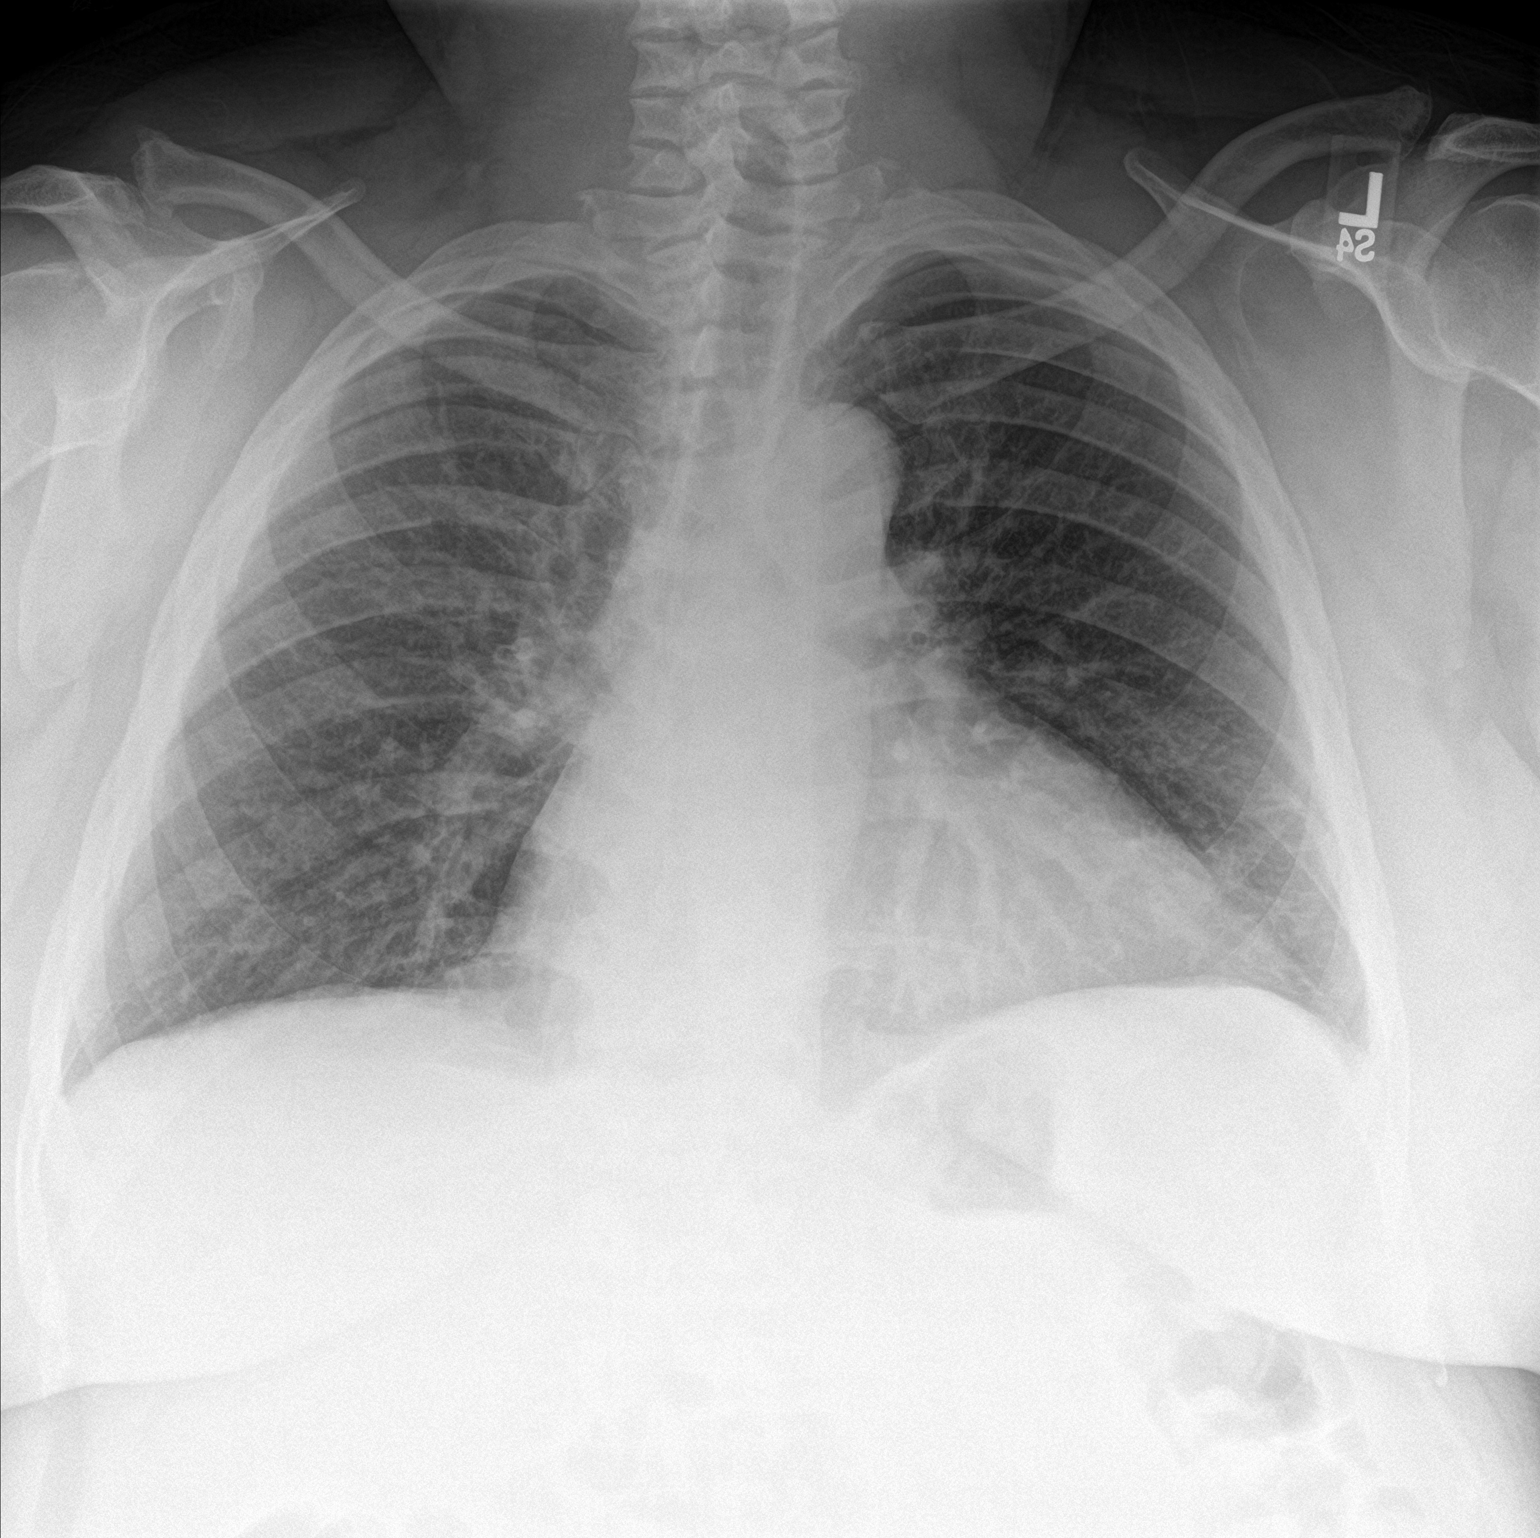

[chest lat]
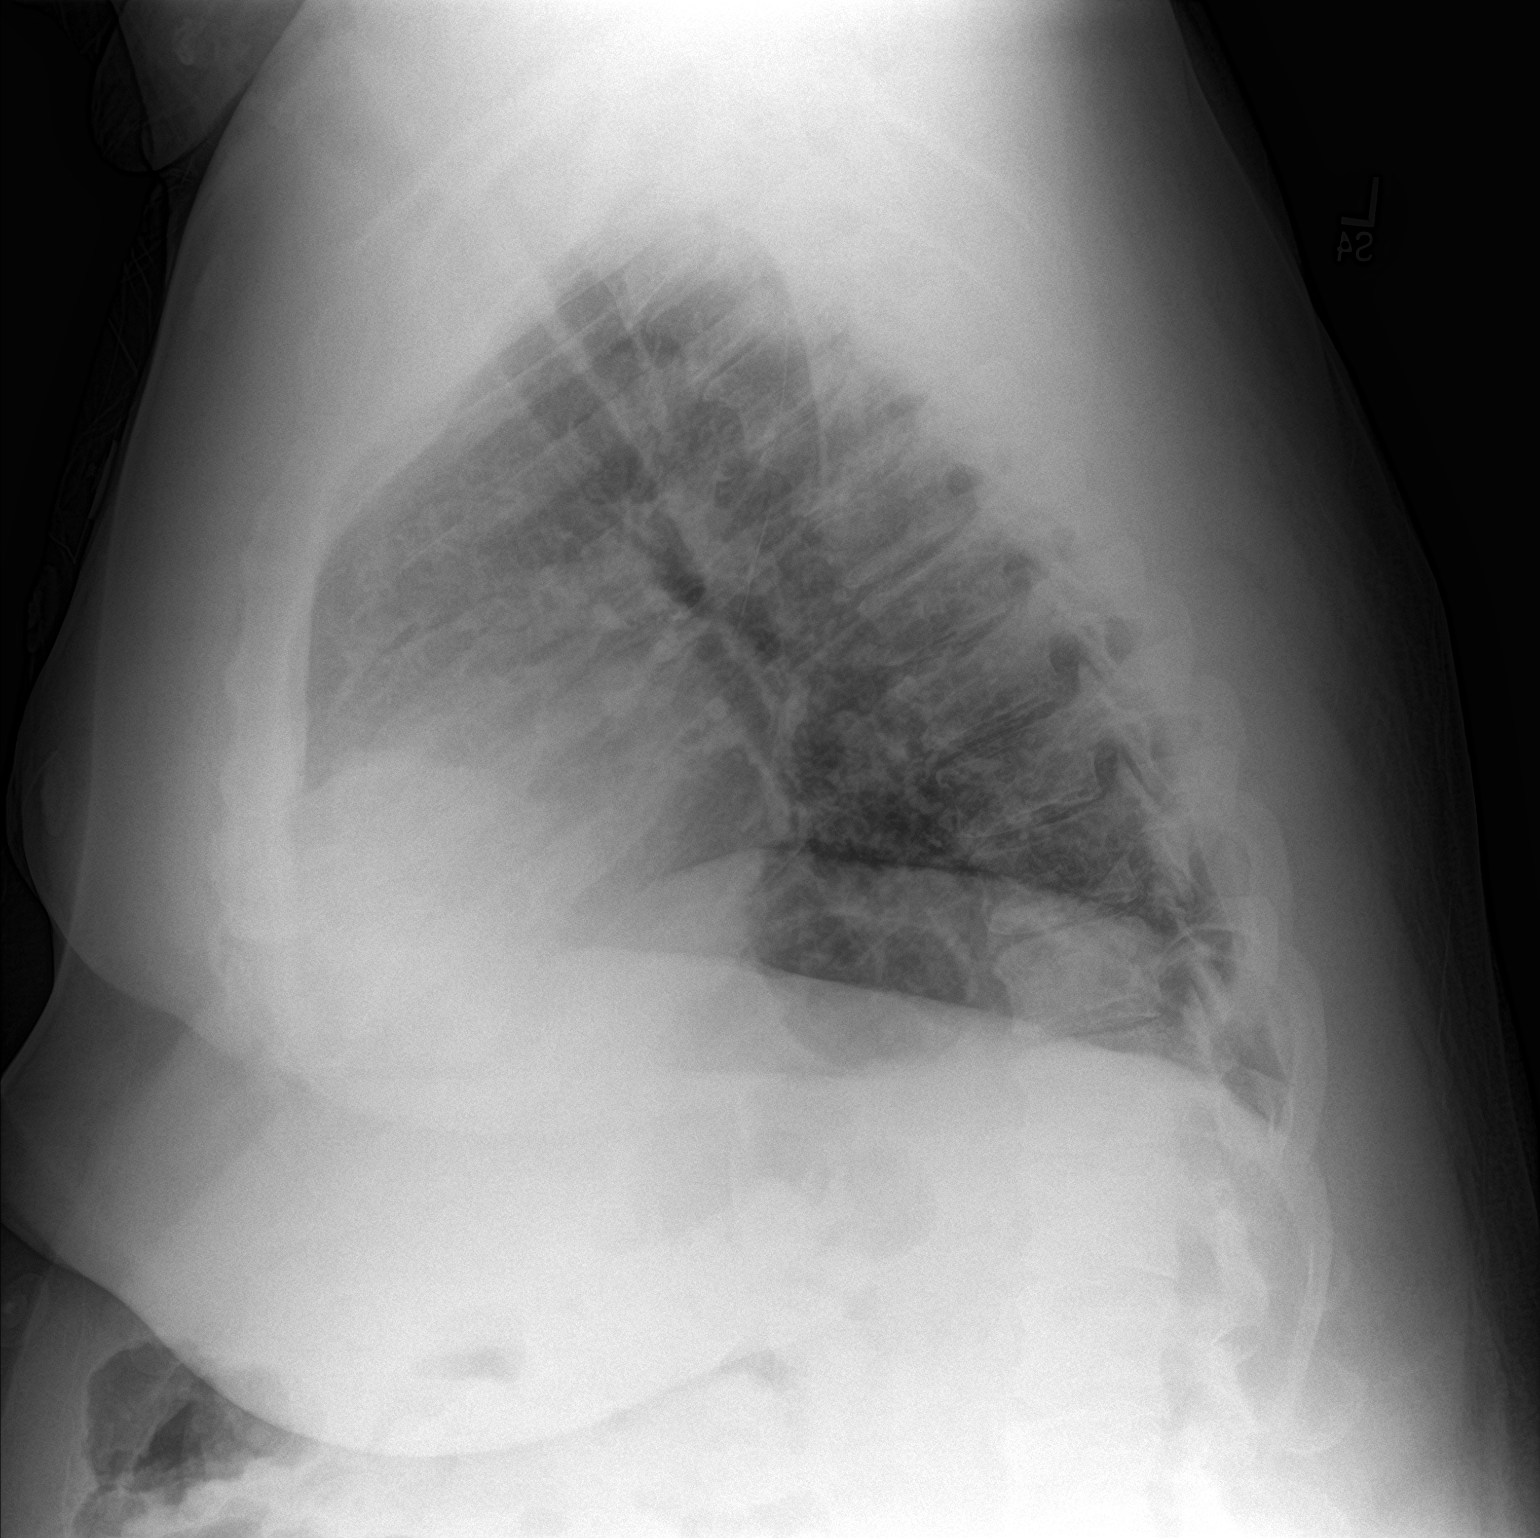

[2 of 2 positions shown; findings below may reference images not displayed]

FINDINGS: Mild perihilar and bibasilar interstitial infiltrate, increased
since previous exam. No confluent airspace disease.

Heart size upper limits normal, stable.

No effusion.  No pneumothorax.

Visualized bones unremarkable.
IMPRESSION: 1. Mild perihilar and bibasilar interstitial infiltrate or edema,
slightly increased since previous exam.

## 2019-02-18 DEATH — deceased
